# Patient Record
Sex: Male | Born: 1945 | Race: White | Hispanic: No | Marital: Married | Smoking: Former smoker
Health system: Southern US, Community
[De-identification: ages and names within clinical notes are randomized; demographics above are authoritative.]

## PROBLEM LIST (undated history)

## (undated) DIAGNOSIS — M1990 Unspecified osteoarthritis, unspecified site: Secondary | ICD-10-CM

## (undated) DIAGNOSIS — R011 Cardiac murmur, unspecified: Secondary | ICD-10-CM

## (undated) DIAGNOSIS — E785 Hyperlipidemia, unspecified: Secondary | ICD-10-CM

## (undated) DIAGNOSIS — L0390 Cellulitis, unspecified: Secondary | ICD-10-CM

## (undated) DIAGNOSIS — I350 Nonrheumatic aortic (valve) stenosis: Secondary | ICD-10-CM

## (undated) DIAGNOSIS — I219 Acute myocardial infarction, unspecified: Secondary | ICD-10-CM

## (undated) DIAGNOSIS — K219 Gastro-esophageal reflux disease without esophagitis: Secondary | ICD-10-CM

## (undated) DIAGNOSIS — I2510 Atherosclerotic heart disease of native coronary artery without angina pectoris: Secondary | ICD-10-CM

## (undated) HISTORY — DX: Hyperlipidemia, unspecified: E78.5

## (undated) HISTORY — DX: Cardiac murmur, unspecified: R01.1

## (undated) HISTORY — PX: CATARACT EXTRACTION: SUR2

## (undated) HISTORY — DX: Nonrheumatic aortic (valve) stenosis: I35.0

---

## 2008-11-14 ENCOUNTER — Ambulatory Visit (HOSPITAL_COMMUNITY): Admission: RE | Admit: 2008-11-14 | Discharge: 2008-11-14 | Payer: Self-pay | Admitting: Family Medicine

## 2009-02-22 ENCOUNTER — Ambulatory Visit (HOSPITAL_COMMUNITY): Admission: RE | Admit: 2009-02-22 | Discharge: 2009-02-22 | Payer: Self-pay | Admitting: Ophthalmology

## 2009-03-28 ENCOUNTER — Ambulatory Visit (HOSPITAL_COMMUNITY): Admission: RE | Admit: 2009-03-28 | Discharge: 2009-03-28 | Payer: Self-pay | Admitting: Ophthalmology

## 2009-04-11 ENCOUNTER — Ambulatory Visit (HOSPITAL_COMMUNITY): Admission: RE | Admit: 2009-04-11 | Discharge: 2009-04-11 | Payer: Self-pay | Admitting: Ophthalmology

## 2010-12-30 LAB — BASIC METABOLIC PANEL
BUN: 14 mg/dL (ref 6–23)
CO2: 26 mEq/L (ref 19–32)
Chloride: 105 mEq/L (ref 96–112)
Glucose, Bld: 142 mg/dL — ABNORMAL HIGH (ref 70–99)
Potassium: 4.3 mEq/L (ref 3.5–5.1)
Sodium: 140 mEq/L (ref 135–145)

## 2011-02-04 NOTE — Op Note (Signed)
Jonathan Romero, Jonathan Romero                ACCOUNT NO.:  [id number]   MEDICAL RECORD NO.:  [id number]          PATIENT TYPE:  AMB   LOCATION:  DAY                           FACILITY:  [facility]   PHYSICIAN:  Susanne Greenhouse, MD       DATE OF BIRTH:  1945/11/08   DATE OF PROCEDURE:  04/11/2009  DATE OF DISCHARGE:  04/11/2009                               OPERATIVE REPORT   PREOPERATIVE DIAGNOSES:  1. Nuclear cataract, right eye.  2. 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   POSTOPERATIVE DIAGNOSES:  1. Nuclear cataract, right eye.  2. 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   SURGEON:  Susanne Greenhouse, MD   ANESTHESIA:  General anesthesia.   OPERATIVE SUMMARY:  In the preoperative holding area, dilating drops and  viscous lidocaine were placed into the right eye.  The patient was then  brought to the operating room where he was placed under general  anesthesia, and then the eye was prepped and draped.  Beginning with a  75 blade was used to make a paracentesis port at the surgeon's 2 o'clock  position.  The anterior chamber was then filled with a 1% nonpreserved  lidocaine solution with epinephrine.  This was followed by Viscoat.  A  2.4-mm keratome blade was used to make a scleral tunnel superiorly.  A  single iris hook was placed beneath the incision to retract the iris.  A  bent cystotome needle and Utrata forceps were used to create a  continuous tear capsulotomy.  Hydrodissection was performed using  balanced salt solution on a fine cannula.  The lens nucleus was then  removed using phacoemulsification in a quadrant cracking technique.  Residual cortex was removed with irrigation and aspiration.  The  capsular bag and anterior chamber were refilled with Amvisc Plus.  The  post chamber interocular lens placed into the capsular bag using a  Monarch lens injecting system.  The Amvisc Plus was then removed from  the capsular bag and anterior chamber with irrigation and aspiration.  The iris hook  was removed.  Stromal hydration of the main incision and  paracentesis ports were performed with balanced salt solution on a fine  cannula.  The wounds were tested for leak, which was negative.  A single  10-0 suture was placed superiorly.  Again, the wounds were tested for  leak that were negative.  There were no operative complications.  The  patient tolerated the procedure well, was revived from general  anesthesia, returned recovery area in satisfactory condition.  Prosthetic device used was an Alcon AcrySof lens model [id number], power of  40.0, serial number [id number], 4.036.           ______________________________  Susanne Greenhouse, MD    KEH/MEDQ  D:  05/09/2009  T:  05/09/2009  Job:  [id number]

## 2011-02-04 NOTE — Op Note (Signed)
NAMEALTON, Jonathan Romero                ACCOUNT NO.:  [id number]   MEDICAL RECORD NO.:  [id number]          PATIENT TYPE:  AMB   LOCATION:  DAY                           FACILITY:  [facility]   PHYSICIAN:  Susanne Greenhouse, MD       DATE OF BIRTH:  1945-12-29   DATE OF PROCEDURE:  03/28/2009  DATE OF DISCHARGE:  03/28/2009                               OPERATIVE REPORT   PREOPERATIVE DIAGNOSIS:  1. Nuclear cataract, left eye; diagnosis code 366.16.  2. 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   POSTOPERATIVE DIAGNOSIS:  1. Nuclear cataract, left eye; diagnosis code 366.16  2. 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   OPERATION PERFORMED:  Phacoemulsification with posterior chamber  intraocular lens implantation, left eye.   SURGEON:  Susanne Greenhouse, MD   ANESTHESIA:  General endotracheal anesthesia.   OPERATIVE SUMMARY:  In the preoperative area, dilating drops were placed  into the left eye.  The patient was then brought into the operating room  where he was placed under general anesthesia.  The eye was then prepped  and draped.  Beginning with a 75 blade, a paracentesis port was made at  the surgeon's 2 o'clock position.  The anterior chamber was then filled  with a 1% nonpreserved lidocaine solution with epinephrine.  This was  followed by Viscoat to deepen the chamber.  A small fornix-based  peritomy was performed superiorly.  Next, a single iris hook was placed  through the limbus superiorly.  A 2.4-mm keratome blade was then used to  make a clear corneal incision over the iris hook.  A bent cystotome  needle and Utrata forceps were used to create a continuous tear  capsulotomy.  Hydrodissection was performed using balanced salt solution  on a fine cannula.  The lens nucleus was then removed using  phacoemulsification in a quadrant cracking technique.  The cortical  material was then removed with irrigation and aspiration.  The capsular  bag and anterior chamber were refilled with Provisc.   The wound was  widened to approximately 3 mm and a posterior chamber intraocular lens  was placed into the capsular bag without difficulty using an Mirant lens injecting system.  A single 10-0 nylon suture was then used  to close the incision as well as stromal hydration.  The Provisc was  removed from the anterior chamber and capsular bag with irrigation and  aspiration.  At this point, the wounds were tested for leak, which were  negative.  The anterior chamber remained  deep and stable.  The patient tolerated the procedure well.  There were  no operative complications, and he awoke from general anesthesia without  problem.  Prosthetic device used is an Alcon AcrySof posterior chamber  lens model [id number], power of 40.0, serial number is [id number].           ______________________________  Susanne Greenhouse, MD     KEH/MEDQ  D:  03/29/2009  T:  03/29/2009  Job:  [id number]

## 2011-09-23 DIAGNOSIS — E785 Hyperlipidemia, unspecified: Secondary | ICD-10-CM

## 2011-09-23 HISTORY — DX: Hyperlipidemia, unspecified: E78.5

## 2011-09-29 ENCOUNTER — Emergency Department (HOSPITAL_COMMUNITY)
Admission: EM | Admit: 2011-09-29 | Discharge: 2011-09-29 | Disposition: A | Discharge status: Home / Self Care | Payer: Medicare Other | Attending: Emergency Medicine | Admitting: Emergency Medicine

## 2011-09-29 ENCOUNTER — Encounter: Payer: Self-pay | Admitting: Emergency Medicine

## 2011-09-29 DIAGNOSIS — L02419 Cutaneous abscess of limb, unspecified: Secondary | ICD-10-CM | POA: Diagnosis not present

## 2011-09-29 DIAGNOSIS — R011 Cardiac murmur, unspecified: Secondary | ICD-10-CM | POA: Diagnosis not present

## 2011-09-29 DIAGNOSIS — L03115 Cellulitis of right lower limb: Secondary | ICD-10-CM

## 2011-09-29 DIAGNOSIS — L03119 Cellulitis of unspecified part of limb: Secondary | ICD-10-CM | POA: Diagnosis not present

## 2011-09-29 LAB — DIFFERENTIAL
Eosinophils Absolute: 0.5 10*3/uL (ref 0.0–0.7)
Eosinophils Relative: 5 % (ref 0–5)
Lymphocytes Relative: 32 % (ref 12–46)
Lymphs Abs: 2.6 10*3/uL (ref 0.7–4.0)
Monocytes Relative: 8 % (ref 3–12)

## 2011-09-29 LAB — CBC
Hemoglobin: 14.8 g/dL (ref 13.0–17.0)
MCH: 33.2 pg (ref 26.0–34.0)
MCV: 95.7 fL (ref 78.0–100.0)
Platelets: 190 10*3/uL (ref 150–400)
RBC: 4.46 MIL/uL (ref 4.22–5.81)
WBC: 8.4 10*3/uL (ref 4.0–10.5)

## 2011-09-29 LAB — BASIC METABOLIC PANEL
BUN: 17 mg/dL (ref 6–23)
CO2: 27 mEq/L (ref 19–32)
GFR calc non Af Amer: 87 mL/min — ABNORMAL LOW (ref >90)
Glucose, Bld: 118 mg/dL — ABNORMAL HIGH (ref 70–99)
Potassium: 4 mEq/L (ref 3.5–5.1)
Sodium: 137 mEq/L (ref 135–145)

## 2011-09-29 MED ORDER — DOXYCYCLINE HYCLATE 100 MG PO CAPS: 100.0000 mg | ORAL_CAPSULE | Freq: Two times a day (BID) | ORAL | Status: AC

## 2011-09-29 MED ORDER — DOXYCYCLINE HYCLATE 100 MG PO TABS
100.0000 mg | ORAL_TABLET | Freq: Once | ORAL | Status: AC
  Administered 2011-09-29: 100 mg via ORAL
  Filled 2011-09-29: qty 1

## 2011-09-29 MED ORDER — VANCOMYCIN HCL IN DEXTROSE 1-5 GM/200ML-% IV SOLN
1000.0000 mg | Freq: Once | INTRAVENOUS | Status: AC
  Administered 2011-09-29: 1000 mg via INTRAVENOUS
  Filled 2011-09-29: qty 200

## 2011-09-29 NOTE — ED Provider Notes (Signed)
History     CSN: [id number]  Arrival date & time 09/29/11  1012   First MD Initiated Contact with Patient 09/29/11 1057      Chief Complaint  Patient presents with  . Recurrent Skin Infections     Patient is a 66 y.o. male presenting with rash. The history is provided by the patient.  Rash  This is a new problem. The current episode started more than 2 days ago. The problem has been gradually worsening. Associated with: possible insect bite. There has been no fever. Affected Location: right calf. The pain is mild. The pain has been constant since onset. Associated symptoms include itching and pain. Treatments tried: topical peroxide.  reports possible insect bite while working outside [employer] a "sting" Since has had pain/redness to right LE No h/o DVT No travel/surgery Reports redness worsened then somewhat improved over the past 24 hurs No h/o Diabetes No h/o severe infection previously  PMH - none  History reviewed. No pertinent past surgical history.  History reviewed. No pertinent family history.  History  Substance Use Topics  . Smoking status: Never Smoker   . Smokeless tobacco: Not on file  . Alcohol Use: No      Review of Systems  Skin: Positive for itching and rash.  All other systems reviewed and are negative.    Allergies  Review of patient's allergies indicates no known allergies.  Home Medications  No current outpatient prescriptions on file.  BP 152/77  Temp(Src) 98.4 F (36.9 C) (Oral)  Resp 20  Ht 5\' 11"  (1.803 m)  Wt 220 lb (99.791 kg)  BMI 30.68 kg/m2  SpO2 97%  Physical Exam CONSTITUTIONAL: Well developed/well nourished HEAD AND FACE: Normocephalic/atraumatic EYES: EOMI/PERRL ENMT: Mucous membranes moist NECK: supple no meningeal signs SPINE:entire spine nontender CV: murmur noted LUNGS: Lungs are clear to auscultation bilaterally, no apparent distress ABDOMEN: soft, nontender, no rebound or guarding GU:no cva tenderness NEURO:  Pt is awake/alert, moves all extremitiesx4 EXTREMITIES: pulses normal, full ROM Erythema noted to right calf but does not encroach into knee/ankle joint.  No calf tenderness.  No edema.  No crepitance.  No drainage.  No abscess noted SKIN: warm PSYCH: no abnormalities of mood noted  ED Course  Procedures   Labs Reviewed  BASIC METABOLIC PANEL - Abnormal; Notable for the following:    Glucose, Bld 118 (*)    GFR calc non Af Amer 87 (*)    All other components within normal limits  CBC  DIFFERENTIAL   1:09 PM Exam more c/w cellulitis rather than DVT Pt is well appearing, otherwise healthy Would be candidate for outpatient management and recheck in 48 hours Pt agreeable  MDM  Nursing notes reviewed and considered in documentation All labs/vitals reviewed and considered         Joya Gaskins, MD 09/29/11 1338

## 2011-09-29 NOTE — ED Notes (Signed)
Redness and swelling to right lower leg. Pt states might have been bitten by spider while working out in yard last week.

## 2011-09-29 NOTE — ED Notes (Signed)
Patient with no complaints at this time. Respirations even and unlabored. Skin warm/dry. Discharge instructions reviewed with patient at this time. Patient given opportunity to voice concerns/ask questions. IV removed per policy and band-aid applied to site. Patient discharged at this time and left Emergency Department with steady gait.  

## 2011-10-03 ENCOUNTER — Other Ambulatory Visit (HOSPITAL_COMMUNITY): Payer: Self-pay | Admitting: Internal Medicine

## 2011-10-03 ENCOUNTER — Ambulatory Visit (HOSPITAL_COMMUNITY)
Admission: RE | Admit: 2011-10-03 | Discharge: 2011-10-03 | Disposition: A | Discharge status: Home / Self Care | Payer: Medicare Other | Source: Ambulatory Visit | Attending: Internal Medicine | Admitting: Internal Medicine

## 2011-10-03 DIAGNOSIS — L0291 Cutaneous abscess, unspecified: Secondary | ICD-10-CM

## 2011-10-03 DIAGNOSIS — M79609 Pain in unspecified limb: Secondary | ICD-10-CM | POA: Insufficient documentation

## 2011-10-03 DIAGNOSIS — K219 Gastro-esophageal reflux disease without esophagitis: Secondary | ICD-10-CM | POA: Diagnosis not present

## 2011-10-03 DIAGNOSIS — I359 Nonrheumatic aortic valve disorder, unspecified: Secondary | ICD-10-CM | POA: Diagnosis not present

## 2011-10-03 DIAGNOSIS — Z23 Encounter for immunization: Secondary | ICD-10-CM | POA: Diagnosis not present

## 2011-10-03 DIAGNOSIS — L0390 Cellulitis, unspecified: Secondary | ICD-10-CM | POA: Insufficient documentation

## 2011-10-03 DIAGNOSIS — Z6831 Body mass index (BMI) 31.0-31.9, adult: Secondary | ICD-10-CM | POA: Diagnosis not present

## 2011-10-06 DIAGNOSIS — Z125 Encounter for screening for malignant neoplasm of prostate: Secondary | ICD-10-CM | POA: Diagnosis not present

## 2011-10-06 DIAGNOSIS — Z79899 Other long term (current) drug therapy: Secondary | ICD-10-CM | POA: Diagnosis not present

## 2011-10-06 DIAGNOSIS — L02818 Cutaneous abscess of other sites: Secondary | ICD-10-CM | POA: Diagnosis not present

## 2011-10-22 DIAGNOSIS — L0390 Cellulitis, unspecified: Secondary | ICD-10-CM | POA: Diagnosis not present

## 2011-10-22 DIAGNOSIS — E785 Hyperlipidemia, unspecified: Secondary | ICD-10-CM | POA: Diagnosis not present

## 2011-10-22 DIAGNOSIS — Z6830 Body mass index (BMI) 30.0-30.9, adult: Secondary | ICD-10-CM | POA: Diagnosis not present

## 2011-10-22 DIAGNOSIS — L0291 Cutaneous abscess, unspecified: Secondary | ICD-10-CM | POA: Diagnosis not present

## 2011-11-15 ENCOUNTER — Encounter (HOSPITAL_COMMUNITY): Payer: Self-pay | Admitting: Adult Health

## 2011-11-15 ENCOUNTER — Emergency Department (HOSPITAL_COMMUNITY)
Admission: EM | Admit: 2011-11-15 | Discharge: 2011-11-15 | Disposition: A | Discharge status: Home / Self Care | Payer: Medicare Other | Attending: Emergency Medicine | Admitting: Emergency Medicine

## 2011-11-15 DIAGNOSIS — R21 Rash and other nonspecific skin eruption: Secondary | ICD-10-CM | POA: Diagnosis not present

## 2011-11-15 DIAGNOSIS — L0889 Other specified local infections of the skin and subcutaneous tissue: Secondary | ICD-10-CM | POA: Insufficient documentation

## 2011-11-15 DIAGNOSIS — L298 Other pruritus: Secondary | ICD-10-CM | POA: Insufficient documentation

## 2011-11-15 DIAGNOSIS — L2989 Other pruritus: Secondary | ICD-10-CM | POA: Insufficient documentation

## 2011-11-15 HISTORY — DX: Cellulitis, unspecified: L03.90

## 2011-11-15 NOTE — ED Notes (Signed)
Pt given discharge instruction and verb understanding, amb with steady gait to discharge window.  

## 2011-11-15 NOTE — ED Provider Notes (Signed)
History     CSN: [id number]  Arrival date & time 11/15/11  1529   First MD Initiated Contact with Patient 11/15/11 1546      Chief Complaint  Patient presents with  . Recurrent Skin Infections    (Consider location/radiation/quality/duration/timing/severity/associated sxs/prior treatment) The history is provided by the patient.   patient has had what was diagnosed as cellulitis on his right lower leg. He's had that since the end of January. His been on doxycycline and Bactrim for it. Ms. seen in the ER and in by his primary care doctor twice. The last couple days she's developed a different rash to his back stomach flank side. His head previous fevers, but not in the last few days. There's some itchiness to his low back, but the rest of the rash does not itch. He states he feels as if his leg rest doing better. He states they did a Doppler and did not find a blood clot. Has not had any rashes mouth.  Past Medical History  Diagnosis Date  . Cellulitis     History reviewed. No pertinent past surgical history.  History reviewed. No pertinent family history.  History  Substance Use Topics  . Smoking status: Never Smoker   . Smokeless tobacco: Not on file  . Alcohol Use: No      Review of Systems  Constitutional: Negative for fever.  Respiratory: Negative for chest tightness.   Cardiovascular: Negative for chest pain.  Gastrointestinal: Negative for blood in stool.  Musculoskeletal: Negative for back pain.  Skin: Positive for color change and rash.  Neurological: Negative for syncope and numbness.    Allergies  Review of patient's allergies indicates no known allergies.  Home Medications   Current Outpatient Rx  Name Route Sig Dispense Refill  . ASPIRIN EC 81 MG PO TBEC Oral Take 81 mg by mouth daily.    . OMEGA-3 FATTY ACIDS 1000 MG PO CAPS Oral Take 1 g by mouth 2 (two) times daily.    . SULFAMETHOXAZOLE-TMP DS 800-160 MG PO TABS Oral Take 1 tablet by mouth 2 (two)  times daily. Course started 11/03/11 for 14 days    . DOXYCYCLINE HYCLATE 100 MG PO TABS Oral Take 100 mg by mouth 2 (two) times daily. Course started 10/09/11 for 7 days      BP 161/90  Pulse 102  Temp(Src) 97.7 F (36.5 C) (Oral)  Ht 5\' 11"  (1.803 m)  Wt 212 lb (96.163 kg)  BMI 29.57 kg/m2  SpO2 95%  Physical Exam  Nursing note and vitals reviewed. Constitutional: He is oriented to person, place, and time. He appears well-developed and well-nourished.  HENT:  Head: Normocephalic and atraumatic.  Eyes: EOM are normal. Pupils are equal, round, and reactive to light.  Neck: Normal range of motion. Neck supple.  Cardiovascular: Normal rate, regular rhythm and normal heart sounds.   No murmur heard. Pulmonary/Chest: Effort normal and breath sounds normal.  Abdominal: Soft. Bowel sounds are normal. He exhibits no distension and no mass. There is no tenderness. There is no rebound and no guarding.  Musculoskeletal: He exhibits no edema.  Neurological: He is alert and oriented to person, place, and time. No cranial nerve deficit.  Skin: Skin is warm and dry.        right lower extremity erythema. Healing excoriations. No duration. Good distal pulse. Good capillary refill. Patient has patches of erythema on chest and abdomen and back. It is worse on the bilateral flanks and lower back. Rash  is not raised. No scaling. The rash blanches. No mucous membrane involvement   Psychiatric: He has a normal mood and affect.    ED Course  Procedures (including critical care time)  Labs Reviewed - No data to display No results found.   1. Rash       MDM  Patient has had cellulitis of his right lower leg. Appears to be healing well. He has since developed a rash on his torso. It is possibly related to the Bactrim. He only has a couple days this medicine left and I will stop it. He was instructed that he can use hydrocortisone cream Benadryl cream or orally to help with any itching. He is to  follow with his doctor in 2 days for reexamination. There is no mucous membrane involvement.  Juliet Rude. Rubin Payor, MD 11/15/11 1610

## 2011-11-15 NOTE — ED Notes (Signed)
Seen at [facility] for right lower leg cellulitis and placed on 3 different antibitics that he has been taking for 24 days. Now pt has a rash to back, stomach, bilateral flanks and sides that began today, c/o mild itchiness and fever.

## 2011-11-15 NOTE — Discharge Instructions (Signed)
Drug Rash Skin reactions can be caused by several different drugs. Allergy to the medicine can cause itching, hives, and other rashes. Sun exposure causes a red rash with some medicines. Mononucleosis virus can cause a similar red rash when you are taking antibiotics. Sometimes, the rash may be accompanied by pain. The drug rash may happen with new drugs or with medicines that you have been taking for a while. The rash cannot be spread from person to person. In most cases, the symptoms of a drug rash are gone within a few days of stopping the medicine. Your rash, including hives (urticaria), is most likely from the following medicines:  Antibiotics or antimicrobials.   Anticonvulsants or seizure medicines.   Antihypertensives or blood pressure medicines.   Antimalarials.   Antidepressants or depression medicines.   Antianxiety drugs.   Diuretics or water pills.   Nonsteroidal anti-inflammatory drugs.   Simvastatin.   Lithium.   Omeprazole.   Allopurinol.   Pseudoephedrine.   Amiodarone.   Packed red blood cells, when you get a blood transfusion.   Contrast media, such as when getting an imaging test (CT or CAT scan).  This drug list is not all inclusive, but drug rashes have been reported with all the medicines listed above.Your caregiver will tell you which medicines to avoid. If you react to a medicine, a similar or worse reaction can occur the next time you take it. If you need to stop taking an antibiotic because of a drug rash, an alternative antibiotic may be needed to get rid of your infection. Antihistamine or cortisone drugs may be prescribed to help relieve your symptoms. Stay out of the sun until the rash is completely gone.  Be sure to let your caregiver know about your drug reaction. Do not take this medicine in the future. Call your caregiver if your drug rash does not improve within 3 to 4 days. SEEK IMMEDIATE MEDICAL CARE IF:   You develop breathing problems,  swelling in the throat, or wheezing.   You have weakness, fainting, fever, and muscle or joint pains.   You develop blisters or peeling of skin, especially around the mouth.  Document Released: 10/16/2004 Document Revised: 05/21/2011 Document Reviewed: 07/27/2008 [facility] Patient Information 2012 [place], [place].Rash, Generic Many things can cause a rash. We are not certain what is causing the rash that you have. Some causes include infection, allergic reactions, medications, and chemicals. Sometimes something in your home that comes in contact with your skin may cause the rash. These include pets, new soaps, cosmetics, and foods. HOME CARE INSTRUCTIONS   Avoid extreme heat or cold, unless otherwise instructed. This can make the itching worse.   A cool bath or shower or a cool washcloth can sometimes ease the itching.   Avoid scratching. This can cause infection.   Take those medications prescribed by your caregiver.  SEEK IMMEDIATE MEDICAL CARE IF:  You develop increasing pain, swelling, or redness.   You develop a fever.   You develop new or severe symptoms such as body aches and pains, diarrhea, vomiting.   Your rash is not better in 3 days.  Document Released: 08/29/2002 Document Revised: 05/21/2011 Document Reviewed: 11/03/2008 [facility] Patient Information 2012 [place], [place].

## 2011-11-29 DIAGNOSIS — Z6830 Body mass index (BMI) 30.0-30.9, adult: Secondary | ICD-10-CM | POA: Diagnosis not present

## 2011-11-29 DIAGNOSIS — L0291 Cutaneous abscess, unspecified: Secondary | ICD-10-CM | POA: Diagnosis not present

## 2011-11-29 DIAGNOSIS — L0390 Cellulitis, unspecified: Secondary | ICD-10-CM | POA: Diagnosis not present

## 2011-11-29 DIAGNOSIS — K219 Gastro-esophageal reflux disease without esophagitis: Secondary | ICD-10-CM | POA: Diagnosis not present

## 2011-12-08 DIAGNOSIS — I872 Venous insufficiency (chronic) (peripheral): Secondary | ICD-10-CM | POA: Diagnosis not present

## 2011-12-08 DIAGNOSIS — L42 Pityriasis rosea: Secondary | ICD-10-CM | POA: Diagnosis not present

## 2011-12-09 DIAGNOSIS — R011 Cardiac murmur, unspecified: Secondary | ICD-10-CM | POA: Diagnosis not present

## 2011-12-09 DIAGNOSIS — L0291 Cutaneous abscess, unspecified: Secondary | ICD-10-CM | POA: Diagnosis not present

## 2011-12-16 DIAGNOSIS — I059 Rheumatic mitral valve disease, unspecified: Secondary | ICD-10-CM | POA: Diagnosis not present

## 2011-12-16 DIAGNOSIS — I359 Nonrheumatic aortic valve disorder, unspecified: Secondary | ICD-10-CM | POA: Diagnosis not present

## 2011-12-16 HISTORY — PX: DOPPLER ECHOCARDIOGRAPHY: SHX263

## 2011-12-31 DIAGNOSIS — I359 Nonrheumatic aortic valve disorder, unspecified: Secondary | ICD-10-CM | POA: Diagnosis not present

## 2011-12-31 DIAGNOSIS — E782 Mixed hyperlipidemia: Secondary | ICD-10-CM | POA: Diagnosis not present

## 2012-01-02 DIAGNOSIS — E782 Mixed hyperlipidemia: Secondary | ICD-10-CM | POA: Diagnosis not present

## 2012-03-02 ENCOUNTER — Emergency Department (HOSPITAL_COMMUNITY)
Admission: EM | Admit: 2012-03-02 | Discharge: 2012-03-02 | Disposition: A | Discharge status: Home / Self Care | Payer: Medicare Other

## 2012-03-02 NOTE — ED Notes (Signed)
No answer when called 

## 2012-09-06 ENCOUNTER — Other Ambulatory Visit (HOSPITAL_COMMUNITY): Payer: Self-pay | Admitting: Internal Medicine

## 2012-09-06 DIAGNOSIS — I359 Nonrheumatic aortic valve disorder, unspecified: Secondary | ICD-10-CM

## 2012-10-28 ENCOUNTER — Ambulatory Visit (HOSPITAL_COMMUNITY)
Admission: RE | Admit: 2012-10-28 | Discharge: 2012-10-28 | Disposition: A | Discharge status: Home / Self Care | Payer: Medicare Other | Source: Ambulatory Visit | Attending: Family Medicine | Admitting: Family Medicine

## 2012-10-28 ENCOUNTER — Other Ambulatory Visit (HOSPITAL_COMMUNITY): Payer: Self-pay | Admitting: Family Medicine

## 2012-10-28 DIAGNOSIS — J209 Acute bronchitis, unspecified: Secondary | ICD-10-CM | POA: Insufficient documentation

## 2012-10-28 DIAGNOSIS — R071 Chest pain on breathing: Secondary | ICD-10-CM | POA: Insufficient documentation

## 2012-11-22 ENCOUNTER — Ambulatory Visit (HOSPITAL_COMMUNITY): Payer: Medicare Other

## 2012-12-13 ENCOUNTER — Other Ambulatory Visit (HOSPITAL_COMMUNITY): Payer: Self-pay | Admitting: Internal Medicine

## 2012-12-13 DIAGNOSIS — I350 Nonrheumatic aortic (valve) stenosis: Secondary | ICD-10-CM

## 2012-12-21 ENCOUNTER — Ambulatory Visit (HOSPITAL_COMMUNITY)
Admission: RE | Admit: 2012-12-21 | Discharge: 2012-12-21 | Disposition: A | Discharge status: Home / Self Care | Payer: Medicare Other | Source: Ambulatory Visit | Attending: Internal Medicine | Admitting: Internal Medicine

## 2012-12-21 DIAGNOSIS — I350 Nonrheumatic aortic (valve) stenosis: Secondary | ICD-10-CM

## 2012-12-21 DIAGNOSIS — E785 Hyperlipidemia, unspecified: Secondary | ICD-10-CM | POA: Insufficient documentation

## 2012-12-21 DIAGNOSIS — I079 Rheumatic tricuspid valve disease, unspecified: Secondary | ICD-10-CM | POA: Insufficient documentation

## 2012-12-21 DIAGNOSIS — I080 Rheumatic disorders of both mitral and aortic valves: Secondary | ICD-10-CM | POA: Insufficient documentation

## 2012-12-21 HISTORY — PX: DOPPLER ECHOCARDIOGRAPHY: SHX263

## 2012-12-21 NOTE — Progress Notes (Signed)
2D Echo Performed 12/21/2012    Jayshon Dommer, RCS  

## 2013-03-21 ENCOUNTER — Other Ambulatory Visit: Payer: Self-pay | Admitting: Internal Medicine

## 2013-03-21 LAB — COMPREHENSIVE METABOLIC PANEL
Albumin: 4 g/dL (ref 3.5–5.2)
BUN: 18 mg/dL (ref 6–23)
Calcium: 9 mg/dL (ref 8.4–10.5)
Chloride: 106 mEq/L (ref 96–112)
Creat: 0.82 mg/dL (ref 0.50–1.35)
Glucose, Bld: 99 mg/dL (ref 70–99)
Potassium: 4 mEq/L (ref 3.5–5.3)

## 2013-03-23 LAB — NMR LIPOPROFILE WITH LIPIDS
HDL Size: 8.6 nm — ABNORMAL LOW (ref >=9.2)
HDL-C: 31 mg/dL — ABNORMAL LOW (ref >=40)
LDL (calc): 131 mg/dL — ABNORMAL HIGH (ref <100)
LDL Particle Number: 2503 nmol/L — ABNORMAL HIGH (ref <1000)
LDL Size: 19.7 nm — ABNORMAL LOW (ref >20.5)
LP-IR Score: 66 — ABNORMAL HIGH (ref <=45)
Triglycerides: 192 mg/dL — ABNORMAL HIGH (ref <150)
VLDL Size: 45.6 nm (ref <=46.6)

## 2013-03-30 ENCOUNTER — Telehealth: Payer: Self-pay | Admitting: Internal Medicine

## 2013-04-14 ENCOUNTER — Ambulatory Visit (INDEPENDENT_AMBULATORY_CARE_PROVIDER_SITE_OTHER): Payer: Medicare Other | Admitting: Internal Medicine

## 2013-04-14 ENCOUNTER — Encounter: Payer: Self-pay | Admitting: Internal Medicine

## 2013-04-14 VITALS — BP 146/76 | HR 70 | Ht 71.0 in | Wt 204.0 lb

## 2013-04-14 DIAGNOSIS — R011 Cardiac murmur, unspecified: Secondary | ICD-10-CM

## 2013-04-14 DIAGNOSIS — I350 Nonrheumatic aortic (valve) stenosis: Secondary | ICD-10-CM | POA: Insufficient documentation

## 2013-04-14 DIAGNOSIS — I359 Nonrheumatic aortic valve disorder, unspecified: Secondary | ICD-10-CM

## 2013-04-14 DIAGNOSIS — E785 Hyperlipidemia, unspecified: Secondary | ICD-10-CM | POA: Insufficient documentation

## 2013-04-14 MED ORDER — COLESEVELAM HCL 3.75 G PO PACK: 3.7500 g | PACK | Freq: Every day | ORAL | Status: DC

## 2013-04-14 NOTE — Progress Notes (Signed)
  OFFICE NOTE  Chief Complaint:  Followup lipid profile  Primary Care Physician: Colette Ribas, MD  HPI:  Jonathan Romero is a 67 year old gentleman with a history of systolic murmur. He had an echocardiogram last year, which showed a very mild aortic stenosis, peak and mean gradients of 18 and 8 mmHg and a valve area of around 2 square centimeters. He also has marked dyslipidemia. 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 Former testing a year ago of his cholesterol profile showed total LDL particle number of 3135 with LDL calculated at 151 and 1610 in 1988 was the small LDL particle number, which is very high. I recommended he start on Lipitor 40 mg daily; however, he did not get that filled. Eventually he did start taking the Lipitor however reported significant myalgias which is intolerable and has since stopped it. He will not take any statin medications in the future. We did have a repeat lipid profile which shows a particle numbers that is persistently elevated at 2500, but is improved thanks to some weight loss and dietary changes he's made.   PMHx:  Past Medical History  Diagnosis Date  . Cellulitis   . Systolic murmur   . Dyslipidemia     Past Surgical History  Procedure Laterality Date  . Cataract extraction      FAMHx:  No family history on file.  SOCHx:   reports that he quit smoking about 30 years ago. He has never used smokeless tobacco. He reports that  drinks alcohol. He reports that he does not use illicit drugs.  ALLERGIES:  No Known Allergies  ROS: A comprehensive review of systems was negative.  HOME MEDS: Current Outpatient Prescriptions  Medication Sig Dispense Refill  . Multiple Vitamins-Minerals (MENS MULTIVITAMIN PLUS PO) Take by mouth.      . Colesevelam HCl (WELCHOL) 3.75 G PACK Take 1 packet (3.8 g total) by mouth daily.  18 each  0   No current facility-administered medications for this visit.     LABS/IMAGING: No results found for this or any previous visit (from the past 48 hour(s)). No results found.  VITALS: BP 146/76  Pulse 70  Ht 5\' 11"  (1.803 m)  Wt 204 lb (92.534 kg)  BMI 28.46 kg/m2  EXAM: deferred  EKG: deferred  ASSESSMENT: 1. Mild aortic stenosis 2. Uncontrolled dyslipidemia  PLAN: 1.   Jonathan Romero has continued high cholesterol with risk factors for progressive aortic stenosis. He is not interested in taking any statin medications. We'll go ahead and try him on Welchol 3.75 g daily.  Plan to see him back with a repeat lipid profile in 6 months.   Chrystie Nose, MD, [facility] Attending Cardiologist [facility]  Jonathan Romero C 04/14/2013, 5:17 PM

## 2013-04-14 NOTE — Patient Instructions (Addendum)
Your physician recommends that you return for lab work in: 1 month. You will need to be fasting for this blood work. NMR with lipid  Your physician has recommended you make the following change in your medication: Start taking Welchol 3.75 grams daily with meals.  Your physician recommends that you schedule a follow-up appointment in: 6 months

## 2013-04-27 ENCOUNTER — Other Ambulatory Visit: Payer: Self-pay

## 2013-05-09 ENCOUNTER — Telehealth: Payer: Self-pay | Admitting: Internal Medicine

## 2013-05-09 NOTE — Telephone Encounter (Signed)
Welchol was given to him at last visit w/Dr [facility]  He quit taking 4 -5 days ago due to reactions couldn't swallow felt like throat and neck swollen right arm elbow and shoulder  Same reactions he had to statins  Please call

## 2013-05-09 NOTE — Telephone Encounter (Signed)
Returned call.  Pt stated he wanted to let Dr. Rennis Golden know that he had to quit taking Welchol.  Stated he had a "crunch" when he turned his head L to R.  C/o sore throat and swollen neck.  Stated he can't take it, like the -statins.  Stated it makes he feel out of sorts.  Also c/o burning in stomach and chest and constipation.  Pt informed these are common SEs of the medication and Dr. Rennis Golden will be notified for further instructions.  Pt verbalized understanding and agreed w/ plan.  Message forwarded to Dr. Rennis Golden.

## 2013-07-28 ENCOUNTER — Other Ambulatory Visit: Payer: Self-pay

## 2014-01-11 NOTE — Telephone Encounter (Signed)
Encounter has been closed--TP 01/11/14 

## 2014-03-01 DIAGNOSIS — H6980 Other specified disorders of Eustachian tube, unspecified ear: Secondary | ICD-10-CM | POA: Diagnosis not present

## 2014-03-01 DIAGNOSIS — J029 Acute pharyngitis, unspecified: Secondary | ICD-10-CM | POA: Diagnosis not present

## 2014-03-01 DIAGNOSIS — Z6829 Body mass index (BMI) 29.0-29.9, adult: Secondary | ICD-10-CM | POA: Diagnosis not present

## 2015-03-27 ENCOUNTER — Encounter: Payer: Self-pay | Admitting: *Deleted

## 2015-04-10 ENCOUNTER — Encounter: Payer: Self-pay | Admitting: Internal Medicine

## 2015-04-24 ENCOUNTER — Encounter: Payer: Self-pay | Admitting: Internal Medicine

## 2016-01-07 DIAGNOSIS — E663 Overweight: Secondary | ICD-10-CM | POA: Diagnosis not present

## 2016-01-07 DIAGNOSIS — Z1389 Encounter for screening for other disorder: Secondary | ICD-10-CM | POA: Diagnosis not present

## 2016-01-07 DIAGNOSIS — T07 Unspecified multiple injuries: Secondary | ICD-10-CM | POA: Diagnosis not present

## 2016-01-07 DIAGNOSIS — Z6829 Body mass index (BMI) 29.0-29.9, adult: Secondary | ICD-10-CM | POA: Diagnosis not present

## 2016-01-07 DIAGNOSIS — W57XXXA Bitten or stung by nonvenomous insect and other nonvenomous arthropods, initial encounter: Secondary | ICD-10-CM | POA: Diagnosis not present

## 2017-06-30 DIAGNOSIS — H26493 Other secondary cataract, bilateral: Secondary | ICD-10-CM | POA: Diagnosis not present

## 2017-06-30 DIAGNOSIS — Z961 Presence of intraocular lens: Secondary | ICD-10-CM | POA: Diagnosis not present

## 2017-06-30 DIAGNOSIS — I10 Essential (primary) hypertension: Secondary | ICD-10-CM | POA: Diagnosis not present

## 2017-06-30 DIAGNOSIS — H35033 Hypertensive retinopathy, bilateral: Secondary | ICD-10-CM | POA: Diagnosis not present

## 2017-07-09 DIAGNOSIS — Z6828 Body mass index (BMI) 28.0-28.9, adult: Secondary | ICD-10-CM | POA: Diagnosis not present

## 2017-07-09 DIAGNOSIS — Z23 Encounter for immunization: Secondary | ICD-10-CM | POA: Diagnosis not present

## 2017-07-09 DIAGNOSIS — Z1389 Encounter for screening for other disorder: Secondary | ICD-10-CM | POA: Diagnosis not present

## 2017-07-09 DIAGNOSIS — E663 Overweight: Secondary | ICD-10-CM | POA: Diagnosis not present

## 2017-07-09 DIAGNOSIS — L0390 Cellulitis, unspecified: Secondary | ICD-10-CM | POA: Diagnosis not present

## 2017-07-13 DIAGNOSIS — Z1211 Encounter for screening for malignant neoplasm of colon: Secondary | ICD-10-CM | POA: Diagnosis not present

## 2017-09-07 DIAGNOSIS — H40033 Anatomical narrow angle, bilateral: Secondary | ICD-10-CM | POA: Diagnosis not present

## 2017-09-07 DIAGNOSIS — H26493 Other secondary cataract, bilateral: Secondary | ICD-10-CM | POA: Diagnosis not present

## 2018-05-03 DIAGNOSIS — H524 Presbyopia: Secondary | ICD-10-CM | POA: Diagnosis not present

## 2018-05-03 DIAGNOSIS — H35033 Hypertensive retinopathy, bilateral: Secondary | ICD-10-CM | POA: Diagnosis not present

## 2018-05-03 DIAGNOSIS — I10 Essential (primary) hypertension: Secondary | ICD-10-CM | POA: Diagnosis not present

## 2018-05-03 DIAGNOSIS — Z961 Presence of intraocular lens: Secondary | ICD-10-CM | POA: Diagnosis not present

## 2018-11-25 ENCOUNTER — Encounter (HOSPITAL_COMMUNITY): Payer: Self-pay | Admitting: Emergency Medicine

## 2018-11-25 ENCOUNTER — Inpatient Hospital Stay (HOSPITAL_COMMUNITY)
Admission: EM | Admit: 2018-11-25 | Discharge: 2018-11-25 | DRG: 069 | Disposition: A | Discharge status: Home / Self Care | Payer: Medicare Other | Attending: Internal Medicine | Admitting: Internal Medicine

## 2018-11-25 ENCOUNTER — Other Ambulatory Visit: Payer: Self-pay

## 2018-11-25 ENCOUNTER — Emergency Department (HOSPITAL_COMMUNITY): Payer: Medicare Other

## 2018-11-25 ENCOUNTER — Inpatient Hospital Stay (HOSPITAL_COMMUNITY): Payer: Medicare Other

## 2018-11-25 DIAGNOSIS — I160 Hypertensive urgency: Secondary | ICD-10-CM | POA: Diagnosis present

## 2018-11-25 DIAGNOSIS — Z833 Family history of diabetes mellitus: Secondary | ICD-10-CM

## 2018-11-25 DIAGNOSIS — Z87891 Personal history of nicotine dependence: Secondary | ICD-10-CM | POA: Diagnosis not present

## 2018-11-25 DIAGNOSIS — R0789 Other chest pain: Secondary | ICD-10-CM | POA: Diagnosis not present

## 2018-11-25 DIAGNOSIS — Z8249 Family history of ischemic heart disease and other diseases of the circulatory system: Secondary | ICD-10-CM | POA: Diagnosis not present

## 2018-11-25 DIAGNOSIS — G459 Transient cerebral ischemic attack, unspecified: Secondary | ICD-10-CM | POA: Diagnosis not present

## 2018-11-25 DIAGNOSIS — R200 Anesthesia of skin: Secondary | ICD-10-CM | POA: Diagnosis present

## 2018-11-25 DIAGNOSIS — Z79899 Other long term (current) drug therapy: Secondary | ICD-10-CM

## 2018-11-25 DIAGNOSIS — R0602 Shortness of breath: Secondary | ICD-10-CM | POA: Diagnosis not present

## 2018-11-25 DIAGNOSIS — E785 Hyperlipidemia, unspecified: Secondary | ICD-10-CM | POA: Diagnosis present

## 2018-11-25 DIAGNOSIS — I6523 Occlusion and stenosis of bilateral carotid arteries: Secondary | ICD-10-CM | POA: Diagnosis not present

## 2018-11-25 DIAGNOSIS — R29818 Other symptoms and signs involving the nervous system: Secondary | ICD-10-CM | POA: Diagnosis not present

## 2018-11-25 DIAGNOSIS — R079 Chest pain, unspecified: Secondary | ICD-10-CM

## 2018-11-25 DIAGNOSIS — R011 Cardiac murmur, unspecified: Secondary | ICD-10-CM

## 2018-11-25 LAB — CBC
HCT: 47.1 % (ref 39.0–52.0)
Hemoglobin: 15.5 g/dL (ref 13.0–17.0)
MCH: 32.2 pg (ref 26.0–34.0)
MCHC: 32.9 g/dL (ref 30.0–36.0)
MCV: 97.7 fL (ref 80.0–100.0)
NRBC: 0 % (ref 0.0–0.2)
Platelets: 209 10*3/uL (ref 150–400)
RBC: 4.82 MIL/uL (ref 4.22–5.81)
RDW: 12.7 % (ref 11.5–15.5)
WBC: 9.5 10*3/uL (ref 4.0–10.5)

## 2018-11-25 LAB — COMPREHENSIVE METABOLIC PANEL
ALK PHOS: 99 U/L (ref 38–126)
ALT: 20 U/L (ref 0–44)
AST: 21 U/L (ref 15–41)
Albumin: 4.2 g/dL (ref 3.5–5.0)
Anion gap: 10 (ref 5–15)
BUN: 21 mg/dL (ref 8–23)
CO2: 24 mmol/L (ref 22–32)
Calcium: 8.9 mg/dL (ref 8.9–10.3)
Chloride: 103 mmol/L (ref 98–111)
Creatinine, Ser: 0.97 mg/dL (ref 0.61–1.24)
GFR calc non Af Amer: >60 mL/min (ref >60)
Glucose, Bld: 169 mg/dL — ABNORMAL HIGH (ref 70–99)
Potassium: 3.8 mmol/L (ref 3.5–5.1)
Sodium: 137 mmol/L (ref 135–145)
Total Bilirubin: 1 mg/dL (ref 0.3–1.2)
Total Protein: 7.7 g/dL (ref 6.5–8.1)

## 2018-11-25 LAB — DIFFERENTIAL
Abs Immature Granulocytes: 0.02 10*3/uL (ref 0.00–0.07)
Basophils Absolute: 0.1 10*3/uL (ref 0.0–0.1)
Basophils Relative: 1 %
Eosinophils Absolute: 0.4 10*3/uL (ref 0.0–0.5)
Eosinophils Relative: 4 %
Immature Granulocytes: 0 %
Lymphocytes Relative: 50 %
Lymphs Abs: 4.7 10*3/uL — ABNORMAL HIGH (ref 0.7–4.0)
MONO ABS: 0.8 10*3/uL (ref 0.1–1.0)
MONOS PCT: 8 %
Neutro Abs: 3.5 10*3/uL (ref 1.7–7.7)
Neutrophils Relative %: 37 %

## 2018-11-25 LAB — TROPONIN I
Troponin I: <0.03 ng/mL (ref <0.03)
Troponin I: <0.03 ng/mL (ref <0.03)
Troponin I: 0.03 ng/mL (ref <0.03)
Troponin I: <0.03 ng/mL (ref <0.03)

## 2018-11-25 LAB — URINALYSIS, ROUTINE W REFLEX MICROSCOPIC
BILIRUBIN URINE: NEGATIVE
Bacteria, UA: NONE SEEN
Glucose, UA: NEGATIVE mg/dL
Ketones, ur: NEGATIVE mg/dL
Leukocytes,Ua: NEGATIVE
Nitrite: NEGATIVE
Protein, ur: NEGATIVE mg/dL
Specific Gravity, Urine: 1.025 (ref 1.005–1.030)
pH: 5 (ref 5.0–8.0)

## 2018-11-25 LAB — RAPID URINE DRUG SCREEN, HOSP PERFORMED
Amphetamines: NOT DETECTED
Barbiturates: NOT DETECTED
Benzodiazepines: NOT DETECTED
Cocaine: NOT DETECTED
Opiates: NOT DETECTED
Tetrahydrocannabinol: NOT DETECTED

## 2018-11-25 LAB — PROTIME-INR
INR: 1 (ref 0.8–1.2)
Prothrombin Time: 13.1 seconds (ref 11.4–15.2)

## 2018-11-25 LAB — APTT: aPTT: 32 seconds (ref 24–36)

## 2018-11-25 LAB — ECHOCARDIOGRAM COMPLETE

## 2018-11-25 LAB — ETHANOL: Alcohol, Ethyl (B): <10 mg/dL (ref <10)

## 2018-11-25 MED ORDER — ASPIRIN 325 MG PO TABS
325.0000 mg | ORAL_TABLET | Freq: Every day | ORAL | Status: DC
  Administered 2018-11-25: 325 mg via ORAL
  Filled 2018-11-25: qty 1

## 2018-11-25 MED ORDER — ASPIRIN EC 81 MG PO TBEC: 81.0000 mg | DELAYED_RELEASE_TABLET | Freq: Every day | ORAL | Status: DC

## 2018-11-25 MED ORDER — HYDROCHLOROTHIAZIDE 25 MG PO TABS: 25.0000 mg | ORAL_TABLET | Freq: Every day | ORAL | 0 refills | Status: DC

## 2018-11-25 MED ORDER — LABETALOL HCL 5 MG/ML IV SOLN
10.0000 mg | Freq: Once | INTRAVENOUS | Status: AC
  Administered 2018-11-25: 10 mg via INTRAVENOUS
  Filled 2018-11-25: qty 4

## 2018-11-25 MED ORDER — STROKE: EARLY STAGES OF RECOVERY BOOK: Freq: Once | Status: DC

## 2018-11-25 MED ORDER — ACETAMINOPHEN 650 MG RE SUPP: 650.0000 mg | RECTAL | Status: DC | PRN

## 2018-11-25 MED ORDER — HYDROCHLOROTHIAZIDE 12.5 MG PO CAPS: 12.5000 mg | ORAL_CAPSULE | Freq: Every day | ORAL | Status: DC

## 2018-11-25 MED ORDER — ACETAMINOPHEN 325 MG PO TABS: 650.0000 mg | ORAL_TABLET | ORAL | Status: DC | PRN

## 2018-11-25 MED ORDER — ACETAMINOPHEN 160 MG/5ML PO SOLN: 650.0000 mg | ORAL | Status: DC | PRN

## 2018-11-25 MED ORDER — ASPIRIN 81 MG PO TBEC: 81.0000 mg | DELAYED_RELEASE_TABLET | Freq: Every day | ORAL | 0 refills | Status: DC

## 2018-11-25 MED ORDER — ASPIRIN 300 MG RE SUPP: 300.0000 mg | Freq: Every day | RECTAL | Status: DC

## 2018-11-25 MED ORDER — SENNOSIDES-DOCUSATE SODIUM 8.6-50 MG PO TABS
1.0000 | ORAL_TABLET | Freq: Every evening | ORAL | Status: DC | PRN
  Filled 2018-11-25: qty 1

## 2018-11-25 MED ORDER — AMLODIPINE BESYLATE 5 MG PO TABS: 5.0000 mg | ORAL_TABLET | Freq: Every day | ORAL | Status: DC

## 2018-11-25 MED ORDER — SODIUM CHLORIDE 0.9 % IV SOLN: INTRAVENOUS | Status: DC

## 2018-11-25 MED ORDER — ENOXAPARIN SODIUM 40 MG/0.4ML ~~LOC~~ SOLN
40.0000 mg | SUBCUTANEOUS | Status: DC
  Administered 2018-11-25: 40 mg via SUBCUTANEOUS
  Filled 2018-11-25: qty 0.4

## 2018-11-25 MED ORDER — HYDROCHLOROTHIAZIDE 25 MG PO TABS
25.0000 mg | ORAL_TABLET | Freq: Every day | ORAL | Status: DC
  Administered 2018-11-25: 25 mg via ORAL
  Filled 2018-11-25: qty 1

## 2018-11-25 NOTE — Discharge Instructions (Signed)
Phenylephrine oral tablet What is this medicine? PHENYLEPHRINE (fen il EF rin) is a decongestant. It is used to relieve a stuffy nose from allergies, colds, or sinus problems. This medicine may be used for other purposes; ask your health care provider or pharmacist if you have questions. COMMON BRAND NAME(S): Sudafed PE, Sudafed PE Congestion, Sudogest PE What should I tell my health care provider before I take this medicine? They need to know if you have any of the following conditions: -diabetes -heart disease -high blood pressure -prostate problems -taken an MAOI like Carbex, Eldepryl, Marplan, Nardil, or Parnate in last 14 days -thyroid disease -trouble passing urine or change in the amount of urine -an unusual or allergic reaction to phenylephrine, other decongestants, other medicines, foods, dyes, or preservatives -pregnant or trying to get pregnant -breast-feeding How should I use this medicine? Take this medicine by mouth with a glass of water. Follow the directions on the prescription label. Take this medicine with food, water, or milk to prevent stomach upset. Take your medicine at regular intervals. Do not take it more often than directed. Talk to your pediatrician regarding the use of this medicine in children. While this drug may be prescribed for children as young as 24 years old for selected conditions, precautions do apply. Patients over 25 years old may have a stronger reaction and need a smaller dose. Overdosage: If you think you have taken too much of this medicine contact a poison control center or emergency room at once. NOTE: This medicine is only for you. Do not share this medicine with others. What if I miss a dose? If you miss a dose, take it as soon as you can. If it is almost time for your next dose, take only that dose. Do not take double or extra doses. What may interact with this medicine? Do not take this medicine with any of the following  medications: -bromocriptine -cocaine -dihydroergotamine, ergotamine, ergoloid mesylates, methysergide, or ergot-type medication -MAOIs like Carbex, Eldepryl, Marplan, Nardil, and Parnate -other stimulant medicines This medicine may also interact with the following medications: -anesthesia -medicines for blood pressure -medicines for mental depression This list may not describe all possible interactions. Give your health care provider a list of all the medicines, herbs, non-prescription drugs, or dietary supplements you use. Also tell them if you smoke, drink alcohol, or use illegal drugs. Some items may interact with your medicine. What should I watch for while using this medicine? Tell your doctor or healthcare professional if your symptoms do not start to get better or if they get worse. See your doctor if you are not better in 7 days or if you have a fever. What side effects may I notice from receiving this medicine? Side effects that you should report to your doctor or health care professional as soon as possible: -allergic reactions like skin rash, itching or hives, swelling of the face, lips, or tongue -breathing problems -chest pain -fast, irregular heartbeat -feeling faint or lightheaded, falls -pain, tingling, numbness in the hands or feet -unusually weak or tired Side effects that usually do not require medical attention (report to your doctor or health care professional if they continue or are bothersome): -difficulty sleeping -headache -loss of appetite -nervousness -stomach upset, nausea This list may not describe all possible side effects. Call your doctor for medical advice about side effects. You may report side effects to FDA at 1-800-FDA-1088. Where should I keep my medicine? Keep out of the reach of children. Store at room  temperature between 15 and 30 degrees C (59 and 86 degrees F). Protect from light. Throw away any unused medicine after the expiration  date. NOTE: This sheet is a summary. It may not cover all possible information. If you have questions about this medicine, talk to your doctor, pharmacist, or health care provider.  2019 Elsevier/Gold Standard (2008-04-14 13:56:19)

## 2018-11-25 NOTE — ED Notes (Signed)
Pt understands we need urine sample, pt given urinal, unable to give sample at this time.

## 2018-11-25 NOTE — ED Notes (Signed)
CRITICAL VALUE ALERT  Critical Value:  Troponin 0.03  Date & Time Notied:  11/25/2018 1246  Provider Notified: Dr. Allena Katz   Orders Received/Actions taken: None yet

## 2018-11-25 NOTE — ED Triage Notes (Signed)
Pt C/O left sided facial numbness that began 20 minutes ago. Pt denies weakness in extremities. Pt denies speech changes. Pt A&O X 4.

## 2018-11-25 NOTE — Evaluation (Signed)
Physical Therapy Evaluation Patient Details Name: Jonathan Romero MRN: [id number] DOB: [birth year] Today's Date: 11/25/2018   History of Present Illness  Jonathan Romero  is a 73 y.o. male, with history of hyperlipidemia, intolerant to statins came to hospital with left-sided facial numbness which started last night.  Patient said he was watching TV and started suddenly experiencing left-sided facial numbness.  He checked his blood pressure and it was elevated to 201/105.  Patient also experienced burning sensation in the chest.  He drove himself to ED for further evaluation.    Clinical Impression  Patient functioning at baseline for functional mobility and gait.  Plan:  Patient discharged from physical therapy to care of nursing for ambulation daily as tolerated for length of stay.     Follow Up Recommendations No PT follow up    Equipment Recommendations  None recommended by PT    Recommendations for Other Services       Precautions / Restrictions Precautions Precautions: None Restrictions Weight Bearing Restrictions: No      Mobility  Bed Mobility Overal bed mobility: Independent                Transfers Overall transfer level: Independent                  Ambulation/Gait Ambulation/Gait assistance: Independent Gait Distance (Feet): 100 Feet Assistive device: None Gait Pattern/deviations: WFL(Within Functional Limits) Gait velocity: normal   General Gait Details: normal  Stairs            Wheelchair Mobility    Modified Rankin (Stroke Patients Only)       Balance Overall balance assessment: No apparent balance deficits (not formally assessed)                                           Pertinent Vitals/Pain Pain Assessment: No/denies pain    Home Living Family/patient expects to be discharged to:: Private residence Living Arrangements: Spouse/significant other Available Help at Discharge: Family;Available  PRN/intermittently Type of Home: House Home Access: Stairs to enter Entrance Stairs-Rails: Right(has grab bar) Entrance Stairs-Number of Steps: 3 Home Layout: Two level Home Equipment: Cane - single point;Bedside commode;Shower seat;Grab bars - tub/shower      Prior Function Level of Independence: Independent         Comments: Tourist information centre manager, drives     Hand Dominance   Dominant Hand: Right    Extremity/Trunk Assessment   Upper Extremity Assessment Upper Extremity Assessment: Defer to OT evaluation    Lower Extremity Assessment Lower Extremity Assessment: Overall WFL for tasks assessed    Cervical / Trunk Assessment Cervical / Trunk Assessment: Normal  Communication   Communication: No difficulties  Cognition Arousal/Alertness: Awake/alert Behavior During Therapy: WFL for tasks assessed/performed Overall Cognitive Status: Within Functional Limits for tasks assessed                                        General Comments      Exercises     Assessment/Plan    PT Assessment Patent does not need any further PT services  PT Problem List         PT Treatment Interventions      PT Goals (Current goals can be found in the Care Plan section)  Acute Rehab  PT Goals Patient Stated Goal: return home PT Goal Formulation: With patient/family Time For Goal Achievement: 11/25/18 Potential to Achieve Goals: Good    Frequency     Barriers to discharge        Co-evaluation               AM-PAC PT "6 Clicks" Mobility  Outcome Measure Help needed turning from your back to your side while in a flat bed without using bedrails?: None Help needed moving from lying on your back to sitting on the side of a flat bed without using bedrails?: None Help needed moving to and from a bed to a chair (including a wheelchair)?: None Help needed standing up from a chair using your arms (e.g., wheelchair or bedside chair)?: None Help needed to walk in  hospital room?: None Help needed climbing 3-5 steps with a railing? : None 6 Click Score: 24    End of Session   Activity Tolerance: Patient tolerated treatment well Patient left: in bed;with call bell/phone within reach;with family/visitor present Nurse Communication: Mobility status PT Visit Diagnosis: Unsteadiness on feet (R26.81);Other abnormalities of gait and mobility (R26.89);Muscle weakness (generalized) (M62.81)    Time: 0051-1021 PT Time Calculation (min) (ACUTE ONLY): 23 min   Charges:   PT Evaluation $PT Eval Moderate Complexity: 1 Mod PT Treatments $Therapeutic Activity: 23-37 mins        2:27 PM, 11/25/18 Ocie Bob, MPT Physical Therapist with [facility] [phone] office [phone] mobile phone

## 2018-11-25 NOTE — Progress Notes (Signed)
Did not add stroke care plan or document education because when patient arrived to floor doctor d/cd NIH.

## 2018-11-25 NOTE — ED Notes (Signed)
Pt back from MRI, hooked back up to cardiac monitor, vitals set for q68min

## 2018-11-25 NOTE — Progress Notes (Signed)
Removed IV-clean, dry, intact. Reviewed d/c paperwork with patient and daughter. Reviewed new meds and where to pick up. Answered all questions. Walked stable patient, belongings, and daughter to ED entrance where he drove home.

## 2018-11-25 NOTE — ED Provider Notes (Signed)
[facility] EMERGENCY DEPARTMENT Provider Note   CSN: [id number] Arrival date & time: 11/25/18  0245    History   Chief Complaint Chief Complaint  Patient presents with  . Numbness    HPI Jonathan Romero is a 73 y.o. male.     The history is provided by the patient.  Neurologic Problem  This is a new problem. The current episode started less than 1 hour ago. The problem occurs constantly. The problem has not changed since onset.Associated symptoms include chest pain and shortness of breath. Pertinent negatives include no abdominal pain and no headaches. Nothing aggravates the symptoms. Nothing relieves the symptoms. He has tried ASA for the symptoms. The treatment provided no relief.  PT Presents for 2 complaints.  He reports approximately 15 minutes prior to arrival, he had onset of left-sided facial numbness. Denies facial weakness, no slurred speech.  No arm or leg weakness or numbness.  No difficulty walking He denies any visual changes.  He checked his blood pressure and it was significantly elevated. He then reports he began having chest burning for several minutes that is now improving. He has otherwise been feeling well recently.  No recent flulike illness  Past Medical History:  Diagnosis Date  . Cellulitis   . Dyslipidemia   . Hyperlipidemia 2013   LDL particle number [id number] with LDL CALULATED at 151 and 2300 in 1988 was the small LDL particle number,which is very high- not tinterested in taking medications  . Systolic murmur     Patient Active Problem List   Diagnosis Date Noted  . Hyperlipidemia 04/14/2013  . Mild aortic stenosis 04/14/2013    Past Surgical History:  Procedure Laterality Date  . CATARACT EXTRACTION    . DOPPLER ECHOCARDIOGRAPHY  12/21/2012   EF 55-60%,  . DOPPLER ECHOCARDIOGRAPHY  12/16/2011   MILD AORTIC STENOSIS,PEAK AND MEAN GRADIENTS OF 18 AND 8 mmHg and a valve area of around 2 square cm.         Home Medications    Prior to  Admission medications   Medication Sig Start Date End Date Taking? Authorizing Provider  Colesevelam HCl [facility]) 3.75 G PACK Take 1 packet (3.8 g total) by mouth daily. 04/14/13   Hilty, Lisette Abu, MD  Multiple Vitamins-Minerals (MENS MULTIVITAMIN PLUS PO) Take by mouth.    [provider]    Family History Family History  Problem Relation Age of Onset  . Diabetes Mother   . Heart failure Father     Social History Social History   Tobacco Use  . Smoking status: Former Smoker    Last attempt to quit: 04/15/1983    Years since quitting: 35.6  . Smokeless tobacco: Never Used  Substance Use Topics  . Alcohol use: Yes    Comment: beer occasionally  . Drug use: No     Allergies   Statins   Review of Systems Review of Systems  Constitutional: Negative for fever.  Respiratory: Positive for shortness of breath.   Cardiovascular: Positive for chest pain.  Gastrointestinal: Negative for abdominal pain.  Neurological: Positive for numbness. Negative for weakness and headaches.  All other systems reviewed and are negative.    Physical Exam Updated Vital Signs BP (!) 201/100 (BP Location: Left Arm)   Pulse 96   Temp 97.7 F (36.5 C) (Oral)   Resp 14   SpO2 98%   Physical Exam  CONSTITUTIONAL: Well developed/well nourished HEAD: Normocephalic/atraumatic EYES: EOMI/PERRL, no nystagmus, no visual field deficit  no  ptosis ENMT: Mucous membranes moist NECK: supple no meningeal signs, no bruits CV: S1/S2 noted, murmur noted LUNGS: Lungs are clear to auscultation bilaterally, no apparent distress ABDOMEN: soft, nontender, no rebound or guarding GU:no cva tenderness NEURO:Awake/alert, face symmetric, no arm or leg drift is noted Equal 5/5 strength with shoulder abduction, elbow flex/extension, wrist flex/extension in upper extremities and equal hand grips bilaterally Equal 5/5 strength with hip flexion,knee flex/extension, foot dorsi/plantar flexion Cranial nerves  3/4/6/03/30/09/11/12 tested and intact (left facial numbness noted) Gait normal without ataxia No past pointing Sensation to light touch intact in all extremities NIHSS=1 (left facial numbness) EXTREMITIES: pulses normalx4, full ROM SKIN: warm, color normal PSYCH: no abnormalities of mood noted  ED Treatments / Results  Labs (all labs ordered are listed, but only abnormal results are displayed) Labs Reviewed  DIFFERENTIAL - Abnormal; Notable for the following components:      Result Value   Lymphs Abs 4.7 (*)    All other components within normal limits  COMPREHENSIVE METABOLIC PANEL - Abnormal; Notable for the following components:   Glucose, Bld 169 (*)    All other components within normal limits  ETHANOL  PROTIME-INR  APTT  CBC  TROPONIN I  RAPID URINE DRUG SCREEN, HOSP PERFORMED  URINALYSIS, ROUTINE W REFLEX MICROSCOPIC    EKG EKG Interpretation  Date/Time:  Thursday November 25 2018 03:00:02 EST Ventricular Rate:  96 PR Interval:    QRS Duration: 94 QT Interval:  377 QTC Calculation: 477 R Axis:   83 Text Interpretation:  Sinus rhythm Borderline right axis deviation Borderline ST depression, diffuse leads Borderline prolonged QT interval Confirmed by Zadie Rhine ([id number]) on 11/25/2018 3:03:16 AM   Radiology Dg Chest 2 View  Result Date: 11/25/2018 CLINICAL DATA:  Chest burning EXAM: CHEST - 2 VIEW COMPARISON:  10/28/2012 FINDINGS: No acute airspace disease or pleural effusion. Normal cardiomediastinal silhouette with aortic atherosclerosis. No pneumothorax. IMPRESSION: No active cardiopulmonary disease. Electronically Signed   By: Jasmine Pang M.D.   On: 11/25/2018 03:28   Ct Head Wo Contrast  Result Date: 11/25/2018 CLINICAL DATA:  Focal neuro deficit, < 6 hrs, stroke suspected. Left facial numbness, onset 20 minutes ago. EXAM: CT HEAD WITHOUT CONTRAST TECHNIQUE: Contiguous axial images were obtained from the base of the skull through the vertex without intravenous  contrast. COMPARISON:  None. FINDINGS: Brain: Brain volume is normal for age. No intracranial hemorrhage, mass effect, or midline shift. No hydrocephalus. The basilar cisterns are patent. No evidence of territorial infarct or acute ischemia. No extra-axial or intracranial fluid collection. Vascular: No hyperdense vessel or unexpected calcification. Skull: No fracture or focal lesion. Sinuses/Orbits: Paranasal sinuses and mastoid air cells are clear. The visualized orbits are unremarkable. Other: None. IMPRESSION: No acute intracranial abnormality. Electronically Signed   By: Narda Rutherford M.D.   On: 11/25/2018 03:27    Procedures Procedures  CRITICAL CARE Performed by: Joya Gaskins Total critical care time: 31 minutes Critical care time was exclusive of separately billable procedures and treating other patients. Critical care was necessary to treat or prevent imminent or life-threatening deterioration. Critical care was time spent personally by me on the following activities: development of treatment plan with patient and/or surrogate as well as nursing, discussions with consultants, evaluation of patient's response to treatment, examination of patient, obtaining history from patient or surrogate, ordering and performing treatments and interventions, ordering and review of laboratory studies, ordering and review of radiographic studies, pulse oximetry and re-evaluation of patient's condition.   Medications  Ordered in ED Medications  labetalol (NORMODYNE,TRANDATE) injection 10 mg (10 mg Intravenous Given 11/25/18 0333)     Initial Impression / Assessment and Plan / ED Course  I have reviewed the triage vital signs and the nursing notes.  Pertinent labs & imaging results that were available during my care of the patient were reviewed by me and considered in my medical decision making (see chart for details).        3:04 AM Seen on arrival for 2 complaints.  He reports left-sided  facial numbness, as well as chest burning. He is in no acute distress. NIHSS  tPA in stroke considered but not given due to: Rapid improvement/severity mild  3:48 AM Patient with no change.  He is in no acute distress.  Significant hypertension is noted.  Labetalol has been ordered CT head negative He already took ASA prior to arrival 4:21 AM Patient reports numbness is improving.  He has no other focal weakness. CT head negative.  He will be admitted Patient reports symptoms improving. He is resting comfortably, unlikely be aortic dissection.  No hypoxia to suggest PE 4:34 AM D/w with Dr. Sharl Ma for admission Final Clinical Impressions(s) / ED Diagnoses   Final diagnoses:  Chest pain, rule out acute myocardial infarction  Numbness    ED Discharge Orders    None       Zadie Rhine, MD 11/25/18 [phone]

## 2018-11-25 NOTE — Progress Notes (Signed)
*  PRELIMINARY RESULTS* Echocardiogram 2D Echocardiogram has been performed.  Jonathan Romero 11/25/2018, 2:57 PM

## 2018-11-25 NOTE — H&P (Signed)
TRH H&P    Patient Demographics:    Rashed Hapner, is a 73 y.o. male  MRN: [id number]  DOB - 07/16/1946  Admit Date - 11/25/2018  Referring MD/NP/PA: Zadie Rhine  Outpatient Primary MD for the patient is Assunta Found, MD  Patient coming from: Home  Chief complaint-left facial numbness   HPI:    Yosiel Rottinghaus  is a 73 y.o. male, with history of hyperlipidemia, intolerant to statins came to hospital with left-sided facial numbness which started last night.  Patient said he was watching TV and started suddenly experiencing left-sided facial numbness.  He checked his blood pressure and it was elevated to 201/105.  Patient also experienced burning sensation in the chest.  He drove himself to ED for further evaluation. Patient denies slurred speech, no facial weakness.  No arm or leg weakness.  No visual changes. Patient symptom improved when he came to the ED.  TPA was not considered due to rapid improvement and mild symptoms only.  Patient took aspirin before coming to the ED. He denies shortness of breath. Denies nausea vomiting or diarrhea. CT head was negative for stroke. No previous history of stroke or seizures.    Review of systems:    In addition to the HPI above,    All other systems reviewed and are negative.    Past History of the following :    Past Medical History:  Diagnosis Date  . Cellulitis   . Dyslipidemia   . Hyperlipidemia 2013   LDL particle number [id number] with LDL CALULATED at 151 and 2300 in 1988 was the small LDL particle number,which is very high- not tinterested in taking medications  . Systolic murmur       Past Surgical History:  Procedure Laterality Date  . CATARACT EXTRACTION    . DOPPLER ECHOCARDIOGRAPHY  12/21/2012   EF 55-60%,  . DOPPLER ECHOCARDIOGRAPHY  12/16/2011   MILD AORTIC STENOSIS,PEAK AND MEAN GRADIENTS OF 18 AND 8 mmHg and a valve area of around 2  square cm.       Social History:      Social History   Tobacco Use  . Smoking status: Former Smoker    Last attempt to quit: 04/15/1983    Years since quitting: 35.6  . Smokeless tobacco: Never Used  Substance Use Topics  . Alcohol use: Yes    Comment: beer occasionally       Family History :     Family History  Problem Relation Age of Onset  . Diabetes Mother   . Heart failure Father       Home Medications:   Prior to Admission medications   Medication Sig Start Date End Date Taking? Authorizing Provider  Colesevelam HCl [facility]) 3.75 G PACK Take 1 packet (3.8 g total) by mouth daily. 04/14/13   Hilty, Lisette Abu, MD  Multiple Vitamins-Minerals (MENS MULTIVITAMIN PLUS PO) Take by mouth.    [provider]     Allergies:     Allergies  Allergen Reactions  . Statins  Physical Exam:   Vitals  Blood pressure (!) 169/84, pulse 74, temperature 98.3 F (36.8 C), temperature source Oral, resp. rate 11, SpO2 96 %.  1.  General: Appears in no acute distress  2. Psychiatric: Alert, oriented x3, intact insight and judgment  3. Neurologic: Cranial nerves II through grossly intact, mild facial numbness on left, motor strength 5/5 in all extremities  4. HEENMT:  Atraumatic normocephalic, oral mucosa is pink and moist  5. Respiratory : Clear to auscultation bilaterally, no wheezing or crackles.  6. Cardiovascular : S1-S2, regular, grade 3/6 systolic murmur auscultated at apex, right upper second intercostal space  7. Gastrointestinal:  Abdomen is  soft, nontender, no organomegaly     Data Review:    CBC Recent Labs  Lab 11/25/18 0257  WBC 9.5  HGB 15.5  HCT 47.1  PLT 209  MCV 97.7  MCH 32.2  MCHC 32.9  RDW 12.7  LYMPHSABS 4.7*  MONOABS 0.8  EOSABS 0.4  BASOSABS 0.1   ------------------------------------------------------------------------------------------------------------------  Results for orders placed or performed  during the hospital encounter of 11/25/18 (from the past 48 hour(s))  Ethanol     Status: None   Collection Time: 11/25/18  2:57 AM  Result Value Ref Range   Alcohol, Ethyl (B) <10 <10 mg/dL    Comment: (NOTE) Lowest detectable limit for serum alcohol is 10 mg/dL. For medical purposes only. Performed at [facility], [address] [place], [place] [address]   Protime-INR     Status: None   Collection Time: 11/25/18  2:57 AM  Result Value Ref Range   Prothrombin Time 13.1 11.4 - 15.2 seconds   INR 1.0 0.8 - 1.2    Comment: (NOTE) INR goal varies based on device and disease states. Performed at [facility], [address], [place], [place] [address]   APTT     Status: None   Collection Time: 11/25/18  2:57 AM  Result Value Ref Range   aPTT 32 24 - 36 seconds    Comment: Performed at [facility], [address] [place], [place] [address]  CBC     Status: None   Collection Time: 11/25/18  2:57 AM  Result Value Ref Range   WBC 9.5 4.0 - 10.5 K/uL   RBC 4.82 4.22 - 5.81 MIL/uL   Hemoglobin 15.5 13.0 - 17.0 g/dL   HCT 62.9 47.6 - 54.6 %   MCV 97.7 80.0 - 100.0 fL   MCH 32.2 26.0 - 34.0 pg   MCHC 32.9 30.0 - 36.0 g/dL   RDW 50.3 54.6 - 56.8 %   Platelets 209 150 - 400 K/uL   nRBC 0.0 0.0 - 0.2 %    Comment: Performed at [facility], [address] [place], [place] [address]  Differential     Status: Abnormal   Collection Time: 11/25/18  2:57 AM  Result Value Ref Range   Neutrophils Relative % 37 %   Neutro Abs 3.5 1.7 - 7.7 K/uL   Lymphocytes Relative 50 %   Lymphs Abs 4.7 (H) 0.7 - 4.0 K/uL   Monocytes Relative 8 %   Monocytes Absolute 0.8 0.1 - 1.0 K/uL   Eosinophils Relative 4 %   Eosinophils Absolute 0.4 0.0 - 0.5 K/uL   Basophils Relative 1 %   Basophils Absolute 0.1 0.0 - 0.1 K/uL   Immature Granulocytes 0 %   Abs Immature Granulocytes 0.02 0.00 - 0.07 K/uL    Comment: Performed at [facility], [address]  [place], [place] [address]  Comprehensive metabolic  panel     Status: Abnormal   Collection Time: 11/25/18  2:57 AM  Result Value Ref Range   Sodium 137 135 - 145 mmol/L   Potassium 3.8 3.5 - 5.1 mmol/L   Chloride 103 98 - 111 mmol/L   CO2 24 22 - 32 mmol/L   Glucose, Bld 169 (H) 70 - 99 mg/dL   BUN 21 8 - 23 mg/dL   Creatinine, Ser 6.04 0.61 - 1.24 mg/dL   Calcium 8.9 8.9 - 54.0 mg/dL   Total Protein 7.7 6.5 - 8.1 g/dL   Albumin 4.2 3.5 - 5.0 g/dL   AST 21 15 - 41 U/L   ALT 20 0 - 44 U/L   Alkaline Phosphatase 99 38 - 126 U/L   Total Bilirubin 1.0 0.3 - 1.2 mg/dL   GFR calc non Af Amer >60 >60 mL/min   GFR calc Af Amer >60 >60 mL/min   Anion gap 10 5 - 15    Comment: Performed at [facility], [address], [place], [place] [address]  Troponin I - ONCE - STAT     Status: None   Collection Time: 11/25/18  2:57 AM  Result Value Ref Range   Troponin I <0.03 <0.03 ng/mL    Comment: Performed at [facility], [address], [place], [place] [address]    Chemistries  Recent Labs  Lab 11/25/18 0257  NA 137  K 3.8  CL 103  CO2 24  GLUCOSE 169*  BUN 21  CREATININE 0.97  CALCIUM 8.9  AST 21  ALT 20  ALKPHOS 99  BILITOT 1.0   ------------------------------------------------------------------------------------------------------------------  ------------------------------------------------------------------------------------------------------------------ GFR: CrCl cannot be calculated (Unknown ideal weight.). Liver Function Tests: Recent Labs  Lab 11/25/18 0257  AST 21  ALT 20  ALKPHOS 99  BILITOT 1.0  PROT 7.7  ALBUMIN 4.2   No results for input(s): LIPASE, AMYLASE in the last 168 hours. No results for input(s): AMMONIA in the last 168 hours. Coagulation Profile: Recent Labs  Lab 11/25/18 0257  INR 1.0   Cardiac Enzymes: Recent Labs  Lab 11/25/18 0257  TROPONINI <0.03   ----------------------------------------------------------------------------------------- Urine analysis: No results found for:  COLORURINE, APPEARANCEUR, LABSPEC, PHURINE, GLUCOSEU, HGBUR, BILIRUBINUR, KETONESUR, PROTEINUR, UROBILINOGEN, NITRITE, LEUKOCYTESUR    Imaging Results:    Dg Chest 2 View  Result Date: 11/25/2018 CLINICAL DATA:  Chest burning EXAM: CHEST - 2 VIEW COMPARISON:  10/28/2012 FINDINGS: No acute airspace disease or pleural effusion. Normal cardiomediastinal silhouette with aortic atherosclerosis. No pneumothorax. IMPRESSION: No active cardiopulmonary disease. Electronically Signed   By: Jasmine Pang M.D.   On: 11/25/2018 03:28   Ct Head Wo Contrast  Result Date: 11/25/2018 CLINICAL DATA:  Focal neuro deficit, < 6 hrs, stroke suspected. Left facial numbness, onset 20 minutes ago. EXAM: CT HEAD WITHOUT CONTRAST TECHNIQUE: Contiguous axial images were obtained from the base of the skull through the vertex without intravenous contrast. COMPARISON:  None. FINDINGS: Brain: Brain volume is normal for age. No intracranial hemorrhage, mass effect, or midline shift. No hydrocephalus. The basilar cisterns are patent. No evidence of territorial infarct or acute ischemia. No extra-axial or intracranial fluid collection. Vascular: No hyperdense vessel or unexpected calcification. Skull: No fracture or focal lesion. Sinuses/Orbits: Paranasal sinuses and mastoid air cells are clear. The visualized orbits are unremarkable. Other: None. IMPRESSION: No acute intracranial abnormality. Electronically Signed   By: Narda Rutherford M.D.   On: 11/25/2018 03:27    My personal review  of EKG: Rhythm NSR, nonspecific ST changes   Assessment & Plan:    Active Problems:   Left facial numbness   1. Left facial numbness-likely from TIA versus hypertensive encephalopathy.  Will obtain echocardiogram, carotid Doppler, lipid profile, hemoglobin A1c.  Continue aspirin 300 mg p.o. daily.  2. Hypertensive urgency-blood pressure improved after patient received labetalol in the ED.  Will allow permissive hypertension, once stroke is ruled  out patient needs to be started on antihypertensive medications.  3. Hyperlipidemia-patient has history of intolerance to statins.  Lipid profile has been ordered.    4. Chest pain-patient complained of burning sensation in the chest.  EKG shows nonspecific ST changes.  Will cycle troponin every 6 hours x3.    DVT Prophylaxis-   Lovenox   AM Labs Ordered, also please review Full Orders  Family Communication: Admission, patients condition and plan of care including tests being ordered have been discussed with the patient and  who indicate understanding and agree with the plan and Code Status.  Code Status: Full code  Admission status: Inpatient: Based on patients clinical presentation and evaluation of above clinical data, I have made determination that patient meets Inpatient criteria at this time.  Time spent in minutes : 60 minutes   Meredeth Ide M.D on 11/25/2018 at 5:16 AM

## 2018-12-03 NOTE — Discharge Summary (Addendum)
[facility] Discharge Summary   Patient: Jonathan Romero RUE:[id number]   PCP: Assunta Found, MD DOB: 1946/03/20   Date of admission: 11/25/2018   Date of discharge: 11/25/2018     Discharge Diagnoses:  Left facial numbness TIA Hypertensive urgency  Admitted From: home Disposition:  home  Recommendations for Outpatient Follow-up:  1. Please follow-up with PCP in 1 week.  Diet recommendation: cardiac diet  Activity: The patient is advised to gradually reintroduce usual activities.  Discharge Condition: good  Code Status: full code  History of present illness: As per the H and P dictated on admission, "Jonathan Romero  is a 73 y.o. male, with history of hyperlipidemia, intolerant to statins came to hospital with left-sided facial numbness which started last night.  Patient said he was watching TV and started suddenly experiencing left-sided facial numbness.  He checked his blood pressure and it was elevated to 201/105.  Patient also experienced burning sensation in the chest.  He drove himself to ED for further evaluation. Patient denies slurred speech, no facial weakness.  No arm or leg weakness.  No visual changes. Patient symptom improved when he came to the ED.  TPA was not considered due to rapid improvement and mild symptoms only.  Patient took aspirin before coming to the ED. He denies shortness of breath. Denies nausea vomiting or diarrhea. CT head was negative for stroke. No previous history of stroke or seizures."  Hospital Course:  Summary of his active problems in the hospital is as following. 1. Left facial numbness-likely from TIA versus hypertensive encephalopathy.    CT of the head and MRI of the brain negative for any acute abnormality.  Also MRA negative for intracranial stenosis.  Carotid Dopplers were also negative for any significant stenosis.  Patient symptoms completely resolved at the time of my evaluation. will recommend to continue 81 mg aspirin. Also  recommended to follow-up with PCP in 1 week to get lipid profile and hemoglobin A1c.  2. Hypertensive urgency-blood pressure improved after patient received labetalol in the ED. since MRI has ruled out stroke, will attempt to control blood pressure.  Patient is not on any blood pressure medication at home will add HCTZ given the history of lower extremity swelling.  3. Hyperlipidemia-patient has history of intolerance to statins.  Lipid profile is recommended outpatient with PCP.  4. Atypical noncardiac chest pain-patient complained of burning sensation in the chest.  EKG shows nonspecific ST changes.    Troponins are negative.  Echocardiogram preserved EF without any significant abnormality.  No further work-up needed.  Patient was ambulatory without any assistance. seen by physical therapy, who recommended no PT follow up needed. On the day of the discharge the patient's vitals were stable , and no other acute medical condition were reported by patient. the patient was felt safe to be discharge at home with family.  Consultants: none Procedures: Echocardiogram   DISCHARGE MEDICATION: Allergies as of 11/25/2018      Reactions   Statins       Medication List    STOP taking these medications   aspirin 325 MG tablet Replaced by:  aspirin 81 MG EC tablet     TAKE these medications   aspirin 81 MG EC tablet Take 1 tablet (81 mg total) by mouth daily. Replaces:  aspirin 325 MG tablet   hydrochlorothiazide 25 MG tablet Commonly known as:  HYDRODIURIL Take 1 tablet (25 mg total) by mouth daily.      Allergies  Allergen Reactions  Statins    Discharge Instructions    Diet - low sodium heart healthy   Complete by:  As directed    Discharge instructions   Complete by:  As directed    It is important that you read the given instructions as well as go over your medication list with RN to help you understand your care after this hospitalization.  Discharge Instructions: Please  follow-up with PCP in 1-2 weeks  Please request your primary care physician to go over all Hospital Tests and Procedure/Radiological results at the follow up. Please get all Hospital records sent to your PCP by signing hospital release before you go home.   Do not take more than prescribed Pain, Sleep and Anxiety Medications. You were cared for by a hospitalist during your hospital stay. If you have any questions about your discharge medications or the care you received while you were in the hospital after you are discharged, you can call the unit @UNIT @ you were admitted to and ask to speak with the hospitalist on call if the hospitalist that took care of you is not available.  Once you are discharged, your primary care physician will handle any further medical issues. Please note that NO REFILLS for any discharge medications will be authorized once you are discharged, as it is imperative that you return to your primary care physician (or establish a relationship with a primary care physician if you do not have one) for your aftercare needs so that they can reassess your need for medications and monitor your lab values. You Must read complete instructions/literature along with all the possible adverse reactions/side effects for all the Medicines you take and that have been prescribed to you. Take any new Medicines after you have completely understood and accept all the possible adverse reactions/side effects. Wear Seat belts while driving. If you have smoked or chewed Tobacco in the last 2 yrs please stop smoking and/or stop any Recreational drug use.  If you drink alcohol, please moderate the use and do not drive, operating heavy machinery, perform activities at heights, swimming or participation in water activities or provide baby sitting services under influence.   Increase activity slowly   Complete by:  As directed      Discharge Exam: There were no vitals filed for this visit. Vitals:    11/25/18 1300 11/25/18 1330  BP: (!) 159/78 (!) 141/76  Pulse:  69  Resp: 14 15  Temp:    SpO2: 100% 96%   General: Appear in no distress, no Rash; Oral Mucosa moist. Cardiovascular: S1 and S2 Present, no Murmur, no JVD Respiratory: Bilateral Air entry present and Clear to Auscultation, no Crackles, no wheezes Abdomen: Bowel Sound present, Soft and no tenderness Extremities: no Pedal edema, no calf tenderness Neurology: Grossly no focal neuro deficit.  The results of significant diagnostics from this hospitalization (including imaging, microbiology, ancillary and laboratory) are listed below for reference.    Significant Diagnostic Studies: Dg Chest 2 View  Result Date: 11/25/2018 CLINICAL DATA:  Chest burning EXAM: CHEST - 2 VIEW COMPARISON:  10/28/2012 FINDINGS: No acute airspace disease or pleural effusion. Normal cardiomediastinal silhouette with aortic atherosclerosis. No pneumothorax. IMPRESSION: No active cardiopulmonary disease. Electronically Signed   By: Jasmine Pang M.D.   On: 11/25/2018 03:28   Ct Head Wo Contrast  Result Date: 11/25/2018 CLINICAL DATA:  Focal neuro deficit, < 6 hrs, stroke suspected. Left facial numbness, onset 20 minutes ago. EXAM: CT HEAD WITHOUT CONTRAST TECHNIQUE: Contiguous axial images  were obtained from the base of the skull through the vertex without intravenous contrast. COMPARISON:  None. FINDINGS: Brain: Brain volume is normal for age. No intracranial hemorrhage, mass effect, or midline shift. No hydrocephalus. The basilar cisterns are patent. No evidence of territorial infarct or acute ischemia. No extra-axial or intracranial fluid collection. Vascular: No hyperdense vessel or unexpected calcification. Skull: No fracture or focal lesion. Sinuses/Orbits: Paranasal sinuses and mastoid air cells are clear. The visualized orbits are unremarkable. Other: None. IMPRESSION: No acute intracranial abnormality. Electronically Signed   By: Narda Rutherford M.D.    On: 11/25/2018 03:27   Mr Brain Wo Contrast  Result Date: 11/25/2018 CLINICAL DATA:  73 year old male with acute onset left side facial numbness this morning. Hypertensive on presentation, 201 systolic. EXAM: MRI HEAD WITHOUT CONTRAST MRA HEAD WITHOUT CONTRAST TECHNIQUE: Multiplanar, multiecho pulse sequences of the brain and surrounding structures were obtained without intravenous contrast. Angiographic images of the head were obtained using MRA technique without contrast. COMPARISON:  Head CT without contrast earlier today. FINDINGS: MRI HEAD FINDINGS Brain: Cerebral volume is within normal limits for age. No restricted diffusion to suggest acute infarction. No midline shift, mass effect, evidence of mass lesion, ventriculomegaly, extra-axial collection or acute intracranial hemorrhage. Cervicomedullary junction and pituitary are within normal limits. Wallace Cullens and white matter signal is within normal limits for age throughout the brain. No cortical encephalomalacia or chronic cerebral blood products identified. Vascular: Major intracranial vascular flow voids are preserved. Skull and upper cervical spine: Negative visible cervical spine. Visualized bone marrow signal is within normal limits. Sinuses/Orbits: Atrophied bilateral globes, but otherwise negative orbits soft tissues. Paranasal sinuses are clear. Other: Minimal fluid in the inferior left mastoids. Mastoids otherwise well pneumatized. Grossly normal visible internal auditory structures. Scalp and face soft tissues appear negative. MRA HEAD FINDINGS Antegrade flow in the posterior circulation. Normal distal vertebral arteries, the left is mildly dominant. Normal PICA origins, vertebrobasilar junction. Patent basilar artery, SCA and PCA origins. Posterior communicating arteries are diminutive or absent. Bilateral PCA branches are within normal limits. Antegrade flow in both ICA siphons. No siphon stenosis. Patent carotid termini, MCA and ACA origins.  Diminutive or absent anterior communicating artery. Visible ACA branches are within normal limits. Mild left MCA M1 segment irregularity without stenosis. Otherwise MCA M1 segments, bifurcations, visible MCA branches are within normal limits. IMPRESSION: 1. No acute intracranial abnormality and normal for age MRI appearance of the brain. 2.  Negative intracranial MRA. 3. Chronic appearing atrophy of both globes. Electronically Signed   By: Odessa Fleming M.D.   On: 11/25/2018 09:32   US Carotid Bilateral (at [facility] Only)  Result Date: 11/25/2018 CLINICAL DATA:  TIA EXAM: BILATERAL CAROTID DUPLEX ULTRASOUND TECHNIQUE: Wallace Cullens scale imaging, color Doppler and duplex ultrasound were performed of bilateral carotid and vertebral arteries in the neck. COMPARISON:  None. FINDINGS: Criteria: Quantification of carotid stenosis is based on velocity parameters that correlate the residual internal carotid diameter with NASCET-based stenosis levels, using the diameter of the distal internal carotid lumen as the denominator for stenosis measurement. The following velocity measurements were obtained: RIGHT ICA: 72 cm/sec CCA: 88 cm/sec SYSTOLIC ICA/CCA RATIO:  0.8 ECA: 63 cm/sec LEFT ICA: 78 cm/sec CCA: 82 cm/sec SYSTOLIC ICA/CCA RATIO:  1.0 ECA: 105 cm/sec RIGHT CAROTID ARTERY: Mild calcified plaque in the bulb. Low resistance internal carotid Doppler pattern is preserved. RIGHT VERTEBRAL ARTERY:  Antegrade. LEFT CAROTID ARTERY: Mild irregular calcified plaque in the bulb. Low resistance internal carotid Doppler pattern  is preserved. LEFT VERTEBRAL ARTERY:  Antegrade. IMPRESSION: Less than 50% stenosis in the right and left internal carotid arteries. Electronically Signed   By: Jolaine Click M.D.   On: 11/25/2018 09:02   Mr Maxine Glenn Head/brain WF Cm  Result Date: 11/25/2018 CLINICAL DATA:  73 year old male with acute onset left side facial numbness this morning. Hypertensive on presentation, 201 systolic. EXAM: MRI HEAD WITHOUT  CONTRAST MRA HEAD WITHOUT CONTRAST TECHNIQUE: Multiplanar, multiecho pulse sequences of the brain and surrounding structures were obtained without intravenous contrast. Angiographic images of the head were obtained using MRA technique without contrast. COMPARISON:  Head CT without contrast earlier today. FINDINGS: MRI HEAD FINDINGS Brain: Cerebral volume is within normal limits for age. No restricted diffusion to suggest acute infarction. No midline shift, mass effect, evidence of mass lesion, ventriculomegaly, extra-axial collection or acute intracranial hemorrhage. Cervicomedullary junction and pituitary are within normal limits. Wallace Cullens and white matter signal is within normal limits for age throughout the brain. No cortical encephalomalacia or chronic cerebral blood products identified. Vascular: Major intracranial vascular flow voids are preserved. Skull and upper cervical spine: Negative visible cervical spine. Visualized bone marrow signal is within normal limits. Sinuses/Orbits: Atrophied bilateral globes, but otherwise negative orbits soft tissues. Paranasal sinuses are clear. Other: Minimal fluid in the inferior left mastoids. Mastoids otherwise well pneumatized. Grossly normal visible internal auditory structures. Scalp and face soft tissues appear negative. MRA HEAD FINDINGS Antegrade flow in the posterior circulation. Normal distal vertebral arteries, the left is mildly dominant. Normal PICA origins, vertebrobasilar junction. Patent basilar artery, SCA and PCA origins. Posterior communicating arteries are diminutive or absent. Bilateral PCA branches are within normal limits. Antegrade flow in both ICA siphons. No siphon stenosis. Patent carotid termini, MCA and ACA origins. Diminutive or absent anterior communicating artery. Visible ACA branches are within normal limits. Mild left MCA M1 segment irregularity without stenosis. Otherwise MCA M1 segments, bifurcations, visible MCA branches are within normal  limits. IMPRESSION: 1. No acute intracranial abnormality and normal for age MRI appearance of the brain. 2.  Negative intracranial MRA. 3. Chronic appearing atrophy of both globes. Electronically Signed   By: Odessa Fleming M.D.   On: 11/25/2018 09:32    Microbiology: No results found for this or any previous visit (from the past 240 hour(s)).   Labs: CBC: No results for input(s): WBC, NEUTROABS, HGB, HCT, MCV, PLT in the last 168 hours. Basic Metabolic Panel: No results for input(s): NA, K, CL, CO2, GLUCOSE, BUN, CREATININE, CALCIUM, MG, PHOS in the last 168 hours. Liver Function Tests: No results for input(s): AST, ALT, ALKPHOS, BILITOT, PROT, ALBUMIN in the last 168 hours. No results for input(s): LIPASE, AMYLASE in the last 168 hours. No results for input(s): AMMONIA in the last 168 hours. Cardiac Enzymes: No results for input(s): CKTOTAL, CKMB, CKMBINDEX, TROPONINI in the last 168 hours. BNP (last 3 results) No results for input(s): BNP in the last 8760 hours. CBG: No results for input(s): GLUCAP in the last 168 hours. Time spent: 35 minutes  Signed:  Lynden Oxford  [facility] 11/25/2018

## 2018-12-08 ENCOUNTER — Emergency Department (HOSPITAL_COMMUNITY)
Admission: EM | Admit: 2018-12-08 | Discharge: 2018-12-08 | Disposition: A | Discharge status: Home / Self Care | Payer: Medicare Other | Attending: Emergency Medicine | Admitting: Emergency Medicine

## 2018-12-08 ENCOUNTER — Encounter (HOSPITAL_COMMUNITY): Payer: Self-pay | Admitting: Emergency Medicine

## 2018-12-08 ENCOUNTER — Other Ambulatory Visit: Payer: Self-pay

## 2018-12-08 ENCOUNTER — Emergency Department (HOSPITAL_COMMUNITY): Payer: Medicare Other

## 2018-12-08 DIAGNOSIS — Z7982 Long term (current) use of aspirin: Secondary | ICD-10-CM | POA: Diagnosis not present

## 2018-12-08 DIAGNOSIS — R011 Cardiac murmur, unspecified: Secondary | ICD-10-CM | POA: Insufficient documentation

## 2018-12-08 DIAGNOSIS — R200 Anesthesia of skin: Secondary | ICD-10-CM | POA: Diagnosis not present

## 2018-12-08 DIAGNOSIS — R52 Pain, unspecified: Secondary | ICD-10-CM | POA: Diagnosis not present

## 2018-12-08 DIAGNOSIS — Z87891 Personal history of nicotine dependence: Secondary | ICD-10-CM | POA: Diagnosis not present

## 2018-12-08 DIAGNOSIS — Z79899 Other long term (current) drug therapy: Secondary | ICD-10-CM | POA: Diagnosis not present

## 2018-12-08 DIAGNOSIS — R29810 Facial weakness: Secondary | ICD-10-CM | POA: Diagnosis not present

## 2018-12-08 DIAGNOSIS — I10 Essential (primary) hypertension: Secondary | ICD-10-CM | POA: Diagnosis not present

## 2018-12-08 DIAGNOSIS — R202 Paresthesia of skin: Secondary | ICD-10-CM | POA: Diagnosis not present

## 2018-12-08 LAB — DIFFERENTIAL
Abs Immature Granulocytes: 0.01 10*3/uL (ref 0.00–0.07)
Basophils Absolute: 0.1 10*3/uL (ref 0.0–0.1)
Basophils Relative: 2 %
Eosinophils Absolute: 0.4 10*3/uL (ref 0.0–0.5)
Eosinophils Relative: 6 %
Immature Granulocytes: 0 %
LYMPHS PCT: 36 %
Lymphs Abs: 2.3 10*3/uL (ref 0.7–4.0)
Monocytes Absolute: 0.5 10*3/uL (ref 0.1–1.0)
Monocytes Relative: 8 %
NEUTROS ABS: 3.1 10*3/uL (ref 1.7–7.7)
Neutrophils Relative %: 48 %

## 2018-12-08 LAB — COMPREHENSIVE METABOLIC PANEL
ALK PHOS: 77 U/L (ref 38–126)
ALT: 28 U/L (ref 0–44)
AST: 27 U/L (ref 15–41)
Albumin: 3.9 g/dL (ref 3.5–5.0)
Anion gap: 10 (ref 5–15)
BUN: 22 mg/dL (ref 8–23)
CO2: 26 mmol/L (ref 22–32)
Calcium: 9.4 mg/dL (ref 8.9–10.3)
Chloride: 98 mmol/L (ref 98–111)
Creatinine, Ser: 0.92 mg/dL (ref 0.61–1.24)
GFR calc Af Amer: >60 mL/min (ref >60)
GFR calc non Af Amer: >60 mL/min (ref >60)
Glucose, Bld: 115 mg/dL — ABNORMAL HIGH (ref 70–99)
Potassium: 3.4 mmol/L — ABNORMAL LOW (ref 3.5–5.1)
Sodium: 134 mmol/L — ABNORMAL LOW (ref 135–145)
Total Bilirubin: 1.2 mg/dL (ref 0.3–1.2)
Total Protein: 7.4 g/dL (ref 6.5–8.1)

## 2018-12-08 LAB — CBC
HEMATOCRIT: 43.2 % (ref 39.0–52.0)
Hemoglobin: 14.9 g/dL (ref 13.0–17.0)
MCH: 33.1 pg (ref 26.0–34.0)
MCHC: 34.5 g/dL (ref 30.0–36.0)
MCV: 96 fL (ref 80.0–100.0)
Platelets: 193 10*3/uL (ref 150–400)
RBC: 4.5 MIL/uL (ref 4.22–5.81)
RDW: 12.3 % (ref 11.5–15.5)
WBC: 6.4 10*3/uL (ref 4.0–10.5)
nRBC: 0 % (ref 0.0–0.2)

## 2018-12-08 LAB — PROTIME-INR
INR: 1 (ref 0.8–1.2)
Prothrombin Time: 12.8 seconds (ref 11.4–15.2)

## 2018-12-08 LAB — APTT: aPTT: 29 seconds (ref 24–36)

## 2018-12-08 LAB — ETHANOL: Alcohol, Ethyl (B): <10 mg/dL (ref <10)

## 2018-12-08 NOTE — ED Notes (Signed)
EDP notified of pt 

## 2018-12-08 NOTE — ED Provider Notes (Signed)
[facility] EMERGENCY DEPARTMENT Provider Note   CSN: [id number] Arrival date & time: 12/08/18  1502    History   Chief Complaint Chief Complaint  Patient presents with  . Numbness    HPI Jonathan Romero is a 73 y.o. male.   HPI Patient presented to the ED for evaluation of facial numbness.  Patient states he went to go take a nap this afternoon.  He he started feeling numbness on the left side of his face.  Patient states it feels like it is like he was at the dentist.  He denies any weakness.  He is not have any difficulty speaking.  He denies any difficulty with his vision or balance.  Patient was in the hospital recently earlier this month for the same symptoms.  He had a negative work-up including an MRI and MRI.  He also had normal carotid ultrasounds.  Patient states he was doing well until the symptoms today. Past Medical History:  Diagnosis Date  . Cellulitis   . Dyslipidemia   . Hyperlipidemia 2013   LDL particle number [id number] with LDL CALULATED at 151 and 2300 in 1988 was the small LDL particle number,which is very high- not tinterested in taking medications  . Systolic murmur     Patient Active Problem List   Diagnosis Date Noted  . Left facial numbness 11/25/2018  . Hyperlipidemia 04/14/2013  . Mild aortic stenosis 04/14/2013    Past Surgical History:  Procedure Laterality Date  . CATARACT EXTRACTION    . DOPPLER ECHOCARDIOGRAPHY  12/21/2012   EF 55-60%,  . DOPPLER ECHOCARDIOGRAPHY  12/16/2011   MILD AORTIC STENOSIS,PEAK AND MEAN GRADIENTS OF 18 AND 8 mmHg and a valve area of around 2 square cm.         Home Medications    Prior to Admission medications   Medication Sig Start Date End Date Taking? Authorizing Provider  aspirin EC 81 MG EC tablet Take 1 tablet (81 mg total) by mouth daily. 11/26/18  Yes Rolly Salter, MD  hydrochlorothiazide (HYDRODIURIL) 25 MG tablet Take 1 tablet (25 mg total) by mouth daily. 11/26/18  Yes Rolly Salter, MD     Family History Family History  Problem Relation Age of Onset  . Diabetes Mother   . Heart failure Father     Social History Social History   Tobacco Use  . Smoking status: Former Smoker    Last attempt to quit: 04/15/1983    Years since quitting: 35.6  . Smokeless tobacco: Never Used  Substance Use Topics  . Alcohol use: Yes    Comment: beer occasionally  . Drug use: No     Allergies   Statins   Review of Systems Review of Systems  All other systems reviewed and are negative.    Physical Exam Updated Vital Signs BP (!) 155/90   Pulse 70   Temp 97.9 F (36.6 C) (Oral)   Resp 17   Ht 1.803 m (5\' 11" )   Wt 90.7 kg   SpO2 95%   BMI 27.89 kg/m   Physical Exam Vitals signs and nursing note reviewed.  Constitutional:      General: He is not in acute distress.    Appearance: He is well-developed.  HENT:     Head: Normocephalic and atraumatic.     Right Ear: External ear normal.     Left Ear: External ear normal.  Eyes:     General: No scleral icterus.  Right eye: No discharge.        Left eye: No discharge.     Conjunctiva/sclera: Conjunctivae normal.  Neck:     Musculoskeletal: Neck supple.     Trachea: No tracheal deviation.  Cardiovascular:     Rate and Rhythm: Normal rate and regular rhythm.  Pulmonary:     Effort: Pulmonary effort is normal. No respiratory distress.     Breath sounds: Normal breath sounds. No stridor. No wheezing or rales.  Abdominal:     General: Bowel sounds are normal. There is no distension.     Palpations: Abdomen is soft.     Tenderness: There is no abdominal tenderness. There is no guarding or rebound.  Musculoskeletal:        General: No tenderness.  Skin:    General: Skin is warm and dry.     Findings: No rash.  Neurological:     Mental Status: He is alert and oriented to person, place, and time.     Cranial Nerves: No cranial nerve deficit (No facial droop, extraocular movements intact, tongue midline ).      Sensory: No sensory deficit.     Motor: No abnormal muscle tone or seizure activity.     Coordination: Coordination normal.     Comments: No pronator drift bilateral upper extrem, able to hold both legs off bed for 5 seconds, sensation intact in all extremities, no visual field cuts, no left or right sided neglect, normal finger-nose exam bilaterally, no nystagmus noted       ED Treatments / Results  Labs (all labs ordered are listed, but only abnormal results are displayed) Labs Reviewed  COMPREHENSIVE METABOLIC PANEL - Abnormal; Notable for the following components:      Result Value   Sodium 134 (*)    Potassium 3.4 (*)    Glucose, Bld 115 (*)    All other components within normal limits  ETHANOL  PROTIME-INR  APTT  CBC  DIFFERENTIAL    EKG EKG Interpretation  Date/Time:  Wednesday December 08 2018 15:05:41 EDT Ventricular Rate:  82 PR Interval:    QRS Duration: 100 QT Interval:  402 QTC Calculation: 470 R Axis:   76 Text Interpretation:  Sinus rhythm No significant change since last tracing Confirmed by Linwood Dibbles [id number]) on 12/08/2018 3:16:30 PM   Radiology Ct Head Wo Contrast  Result Date: 12/08/2018 CLINICAL DATA:  Left-sided facial numbness EXAM: CT HEAD WITHOUT CONTRAST TECHNIQUE: Contiguous axial images were obtained from the base of the skull through the vertex without intravenous contrast. COMPARISON:  CT brain, 11/25/2018 FINDINGS: Brain: No evidence of acute infarction, hemorrhage, hydrocephalus, extra-axial collection or mass lesion/mass effect. Vascular: No hyperdense vessel or unexpected calcification. Skull: Normal. Negative for fracture or focal lesion. Sinuses/Orbits: No acute finding. Other: None. IMPRESSION: No acute intracranial pathology. Electronically Signed   By: Lauralyn Primes M.D.   On: 12/08/2018 16:51    Procedures Procedures (including critical care time)  Medications Ordered in ED Medications - No data to display   Initial Impression /  Assessment and Plan / ED Course  I have reviewed the triage vital signs and the nursing notes.  Pertinent labs & imaging results that were available during my care of the patient were reviewed by me and considered in my medical decision making (see chart for details).   Pt presents for recurrent facial numbness.    Previous admission with extensive workup including carotid Dopplers, MRI and MRA.  No focal deficits on exam.  Complains of numbness but sensation is intact.  Pt is feeling better at this time.  Doubt stroke, tia.  Discussed close follow up with PCP and consider neurology follow up.  Final Clinical Impressions(s) / ED Diagnoses   Final diagnoses:  Paresthesia    ED Discharge Orders    None       Linwood Dibbles, MD 12/08/18 1739

## 2018-12-08 NOTE — Discharge Instructions (Addendum)
Follow up with your primary care doctor, consider seeing a neurologist, Dr Gerilyn Pilgrim for further evaluation

## 2018-12-08 NOTE — ED Triage Notes (Signed)
Numbness to lt side of face and lt side of back of head started approx ago. Seen here recently and admitted for same symptoms dx with tia. Pt has no other s/s at this time.   cbg 111.  Pt is a&o x 4 at this time and denies any pain.

## 2018-12-08 NOTE — ED Notes (Signed)
EDP at the bedside.  ?

## 2018-12-15 DIAGNOSIS — Z1389 Encounter for screening for other disorder: Secondary | ICD-10-CM | POA: Diagnosis not present

## 2018-12-15 DIAGNOSIS — E7849 Other hyperlipidemia: Secondary | ICD-10-CM | POA: Diagnosis not present

## 2018-12-15 DIAGNOSIS — E785 Hyperlipidemia, unspecified: Secondary | ICD-10-CM | POA: Diagnosis not present

## 2018-12-15 DIAGNOSIS — Z125 Encounter for screening for malignant neoplasm of prostate: Secondary | ICD-10-CM | POA: Diagnosis not present

## 2018-12-15 DIAGNOSIS — Z6828 Body mass index (BMI) 28.0-28.9, adult: Secondary | ICD-10-CM | POA: Diagnosis not present

## 2018-12-15 DIAGNOSIS — Z23 Encounter for immunization: Secondary | ICD-10-CM | POA: Diagnosis not present

## 2018-12-15 DIAGNOSIS — R7309 Other abnormal glucose: Secondary | ICD-10-CM | POA: Diagnosis not present

## 2018-12-15 DIAGNOSIS — Z79899 Other long term (current) drug therapy: Secondary | ICD-10-CM | POA: Diagnosis not present

## 2018-12-15 DIAGNOSIS — Z0001 Encounter for general adult medical examination with abnormal findings: Secondary | ICD-10-CM | POA: Diagnosis not present

## 2018-12-15 DIAGNOSIS — R5383 Other fatigue: Secondary | ICD-10-CM | POA: Diagnosis not present

## 2018-12-15 DIAGNOSIS — E663 Overweight: Secondary | ICD-10-CM | POA: Diagnosis not present

## 2019-02-03 DIAGNOSIS — Z1389 Encounter for screening for other disorder: Secondary | ICD-10-CM | POA: Diagnosis not present

## 2019-02-03 DIAGNOSIS — Z6827 Body mass index (BMI) 27.0-27.9, adult: Secondary | ICD-10-CM | POA: Diagnosis not present

## 2019-02-03 DIAGNOSIS — E7849 Other hyperlipidemia: Secondary | ICD-10-CM | POA: Diagnosis not present

## 2019-02-03 DIAGNOSIS — T466X5D Adverse effect of antihyperlipidemic and antiarteriosclerotic drugs, subsequent encounter: Secondary | ICD-10-CM | POA: Diagnosis not present

## 2019-02-03 DIAGNOSIS — E663 Overweight: Secondary | ICD-10-CM | POA: Diagnosis not present

## 2019-05-09 DIAGNOSIS — Z6841 Body Mass Index (BMI) 40.0 and over, adult: Secondary | ICD-10-CM | POA: Diagnosis not present

## 2019-05-09 DIAGNOSIS — E7849 Other hyperlipidemia: Secondary | ICD-10-CM | POA: Diagnosis not present

## 2019-05-09 DIAGNOSIS — Z888 Allergy status to other drugs, medicaments and biological substances status: Secondary | ICD-10-CM | POA: Diagnosis not present

## 2019-05-09 DIAGNOSIS — T466X5D Adverse effect of antihyperlipidemic and antiarteriosclerotic drugs, subsequent encounter: Secondary | ICD-10-CM | POA: Diagnosis not present

## 2019-11-10 IMAGING — CT CT HEAD WITHOUT CONTRAST
3 series · 16 of 47 positions shown, 19 images · non-contrast
Comparison: CT brain, 11/25/2018

CLINICAL DATA: Left-sided facial numbness

EXAM:
CT HEAD WITHOUT CONTRAST
TECHNIQUE: Contiguous axial images were obtained from the base of the skull
through the vertex without intravenous contrast.

[Series 2: head trauma wo · axial · 0.43mm/px · z∈[+55,+185]mm · 10 of 32 slices shown, 13 images]
[im 3/32  brain]
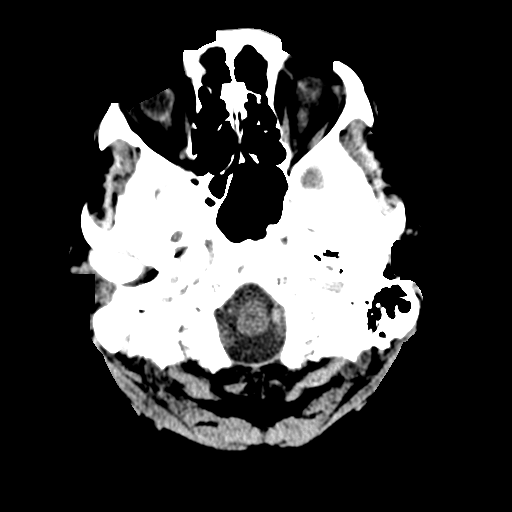
[im 3/32  bone]
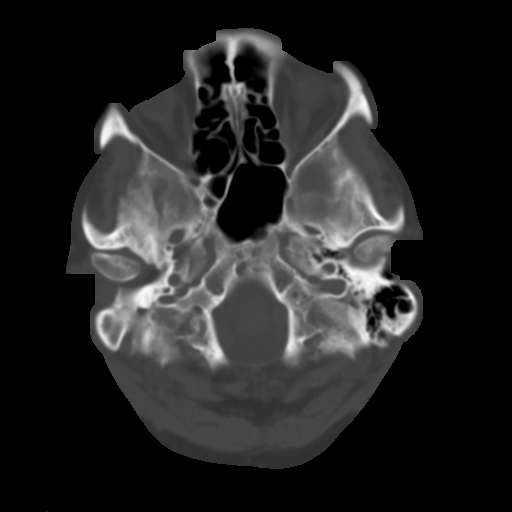
[im 6/32  brain]
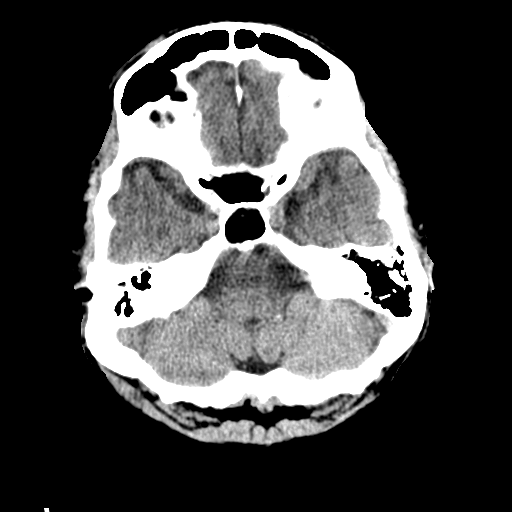
[im 9/32  brain]
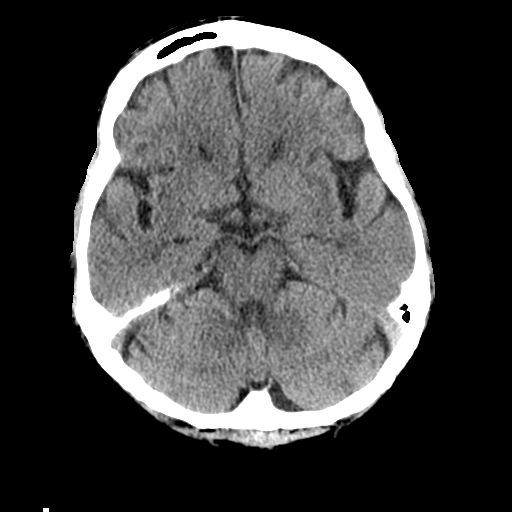
[im 11/32  brain]
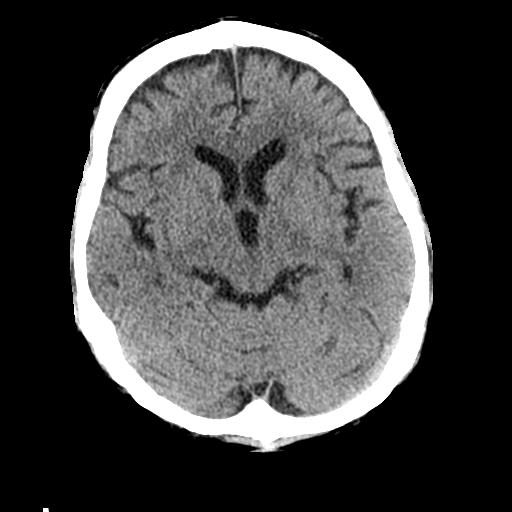
[im 14/32  brain]
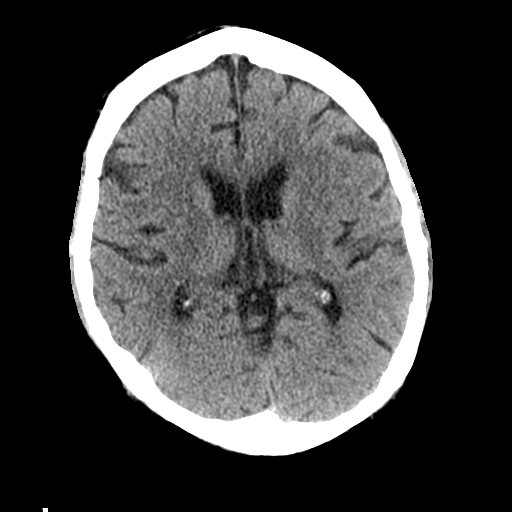
[im 14/32  bone]
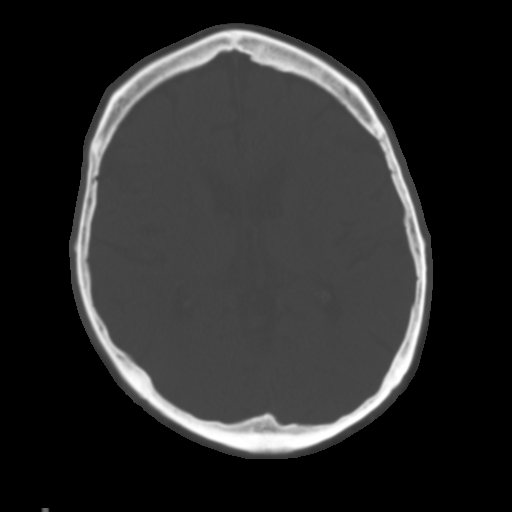
[im 18/32  brain]
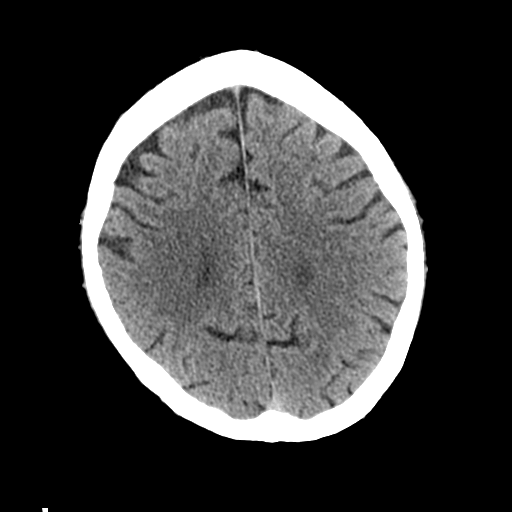
[im 21/32  brain]
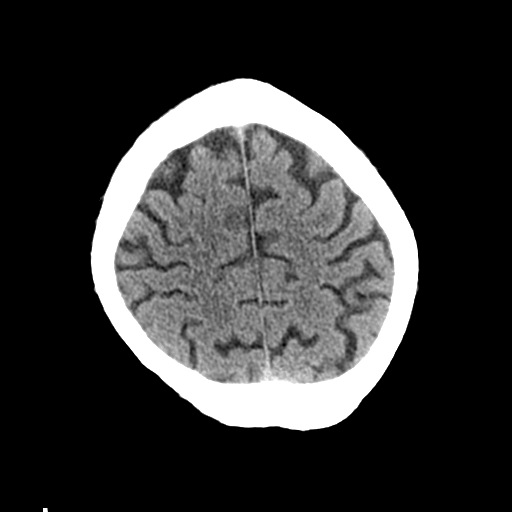
[im 24/32  brain]
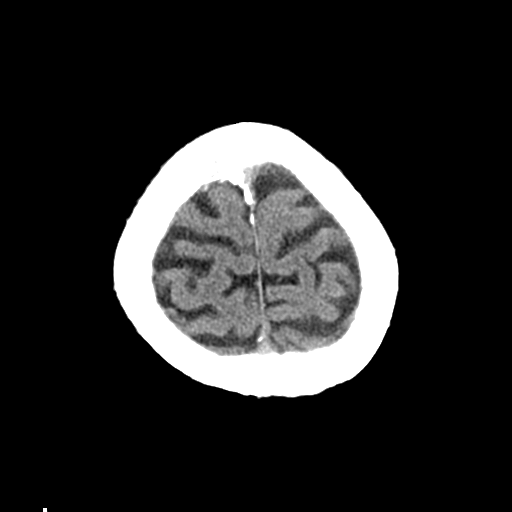
[im 26/32  brain]
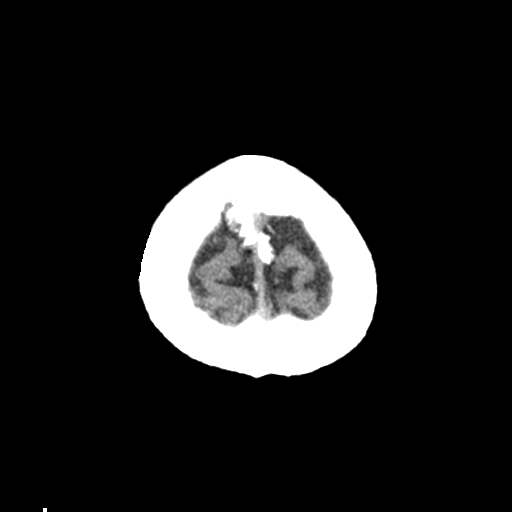
[im 26/32  bone]
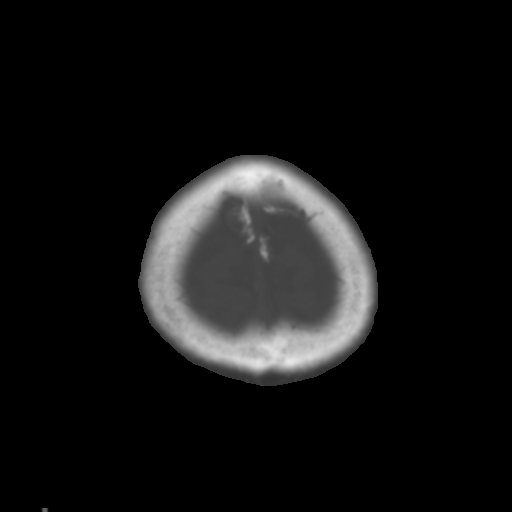
[im 29/32  brain]
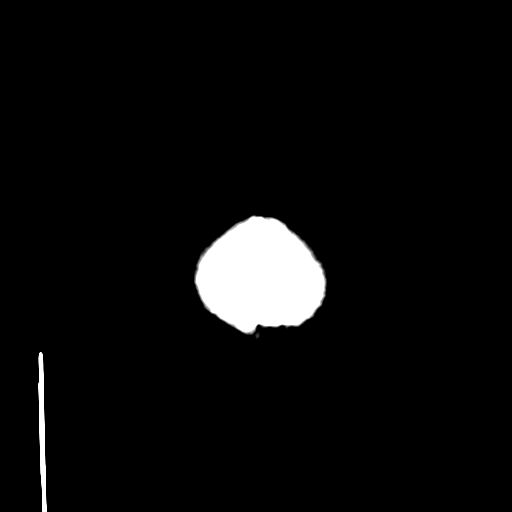

[Series 3: coronal soft tissue · coronal · 0.33mm/px · 3 of 65 slices shown]
[im 22/65  brain]
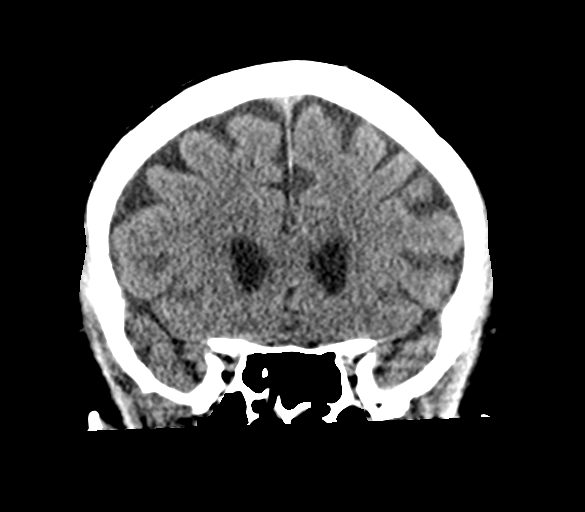
[im 29/65  brain]
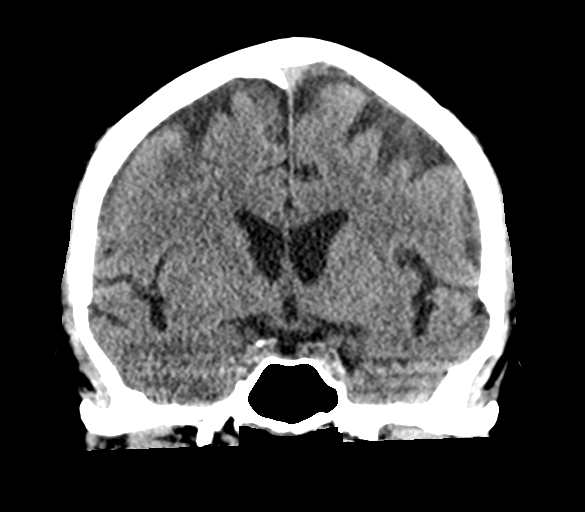
[im 36/65  brain]
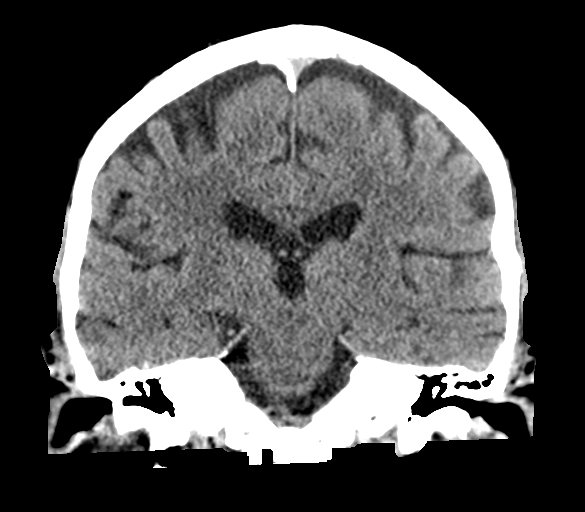

[Series 5: sagittal soft tissue · sagittal · 0.33mm/px · 3 of 65 slices shown]
[im 22/65  brain]
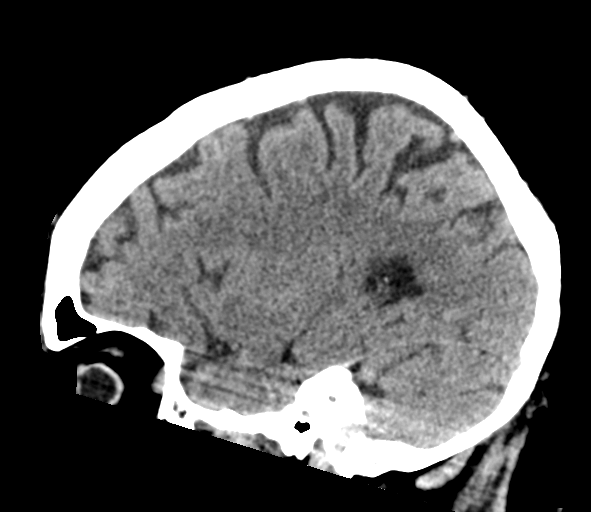
[im 33/65  brain]
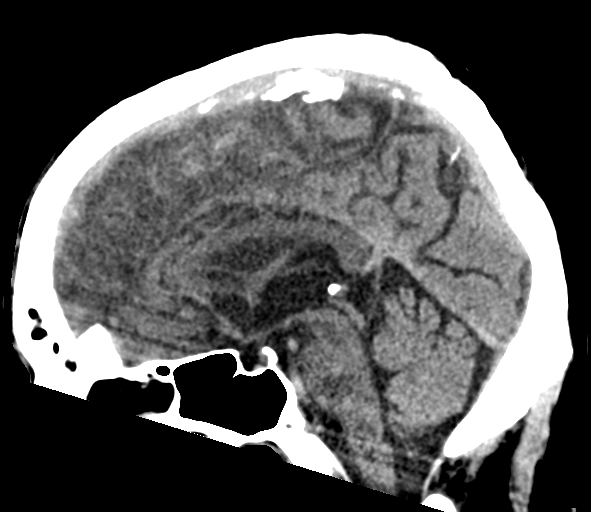
[im 43/65  brain]
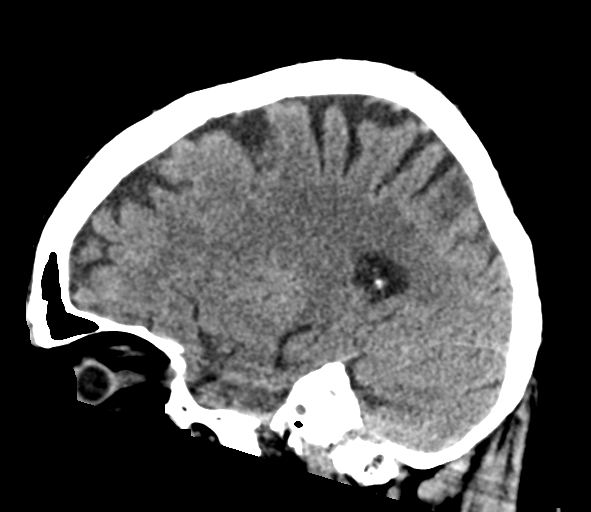

[16 of 47 positions shown; findings below may reference images not displayed]

FINDINGS: Brain: No evidence of acute infarction, hemorrhage, hydrocephalus,
extra-axial collection or mass lesion/mass effect.

Vascular: No hyperdense vessel or unexpected calcification.

Skull: Normal. Negative for fracture or focal lesion.

Sinuses/Orbits: No acute finding.

Other: None.
IMPRESSION: No acute intracranial pathology.

## 2020-02-24 DIAGNOSIS — R233 Spontaneous ecchymoses: Secondary | ICD-10-CM | POA: Diagnosis not present

## 2020-02-24 DIAGNOSIS — B387 Disseminated coccidioidomycosis: Secondary | ICD-10-CM | POA: Diagnosis not present

## 2020-02-24 DIAGNOSIS — R201 Hypoesthesia of skin: Secondary | ICD-10-CM | POA: Diagnosis not present

## 2020-02-24 DIAGNOSIS — R21 Rash and other nonspecific skin eruption: Secondary | ICD-10-CM | POA: Diagnosis not present

## 2020-02-24 DIAGNOSIS — W57XXXA Bitten or stung by nonvenomous insect and other nonvenomous arthropods, initial encounter: Secondary | ICD-10-CM | POA: Diagnosis not present

## 2020-02-24 DIAGNOSIS — Z2089 Contact with and (suspected) exposure to other communicable diseases: Secondary | ICD-10-CM | POA: Diagnosis not present

## 2021-04-19 ENCOUNTER — Ambulatory Visit
Admission: EM | Admit: 2021-04-19 | Discharge: 2021-04-19 | Disposition: A | Discharge status: Home / Self Care | Payer: Medicare Other | Attending: Internal Medicine | Admitting: Internal Medicine

## 2021-04-19 ENCOUNTER — Encounter: Payer: Self-pay | Admitting: *Deleted

## 2021-04-19 ENCOUNTER — Other Ambulatory Visit: Payer: Self-pay

## 2021-04-19 DIAGNOSIS — R5383 Other fatigue: Secondary | ICD-10-CM

## 2021-04-19 DIAGNOSIS — R6889 Other general symptoms and signs: Secondary | ICD-10-CM

## 2021-04-19 MED ORDER — PREDNISONE 20 MG PO TABS: 20.0000 mg | ORAL_TABLET | Freq: Two times a day (BID) | ORAL | 0 refills | Status: DC

## 2021-04-19 MED ORDER — PREDNISONE 20 MG PO TABS: 20.0000 mg | ORAL_TABLET | Freq: Two times a day (BID) | ORAL | 0 refills | Status: AC

## 2021-04-19 NOTE — ED Triage Notes (Signed)
PT also reports he did not eat this morning

## 2021-04-19 NOTE — ED Notes (Signed)
CBG: 113 °

## 2021-04-19 NOTE — ED Triage Notes (Signed)
Pt reports Sx's started this AM . Pt reports he feels like he has the FLU. Pt mowed his yard yesterday and after he went into the air condition house . Pt reported he did not feel like eating last night.

## 2021-04-19 NOTE — ED Provider Notes (Signed)
[facility]   [id number] 04/19/21 Arrival Time: 1157   CC: COVID symptoms  SUBJECTIVE: History from: patient.  Jonathan Romero is a 75 y.o. male who presents with fatigue, congestion, sore throat, and slight cough that began this morning.  Denies sick exposure to COVID, flu or strep.  Denies alleviating or aggravating factors.  Denies previous covid infection in the past.   Denies fever, chills, SOB, wheezing, chest pain, nausea, changes in bowel or bladder habits.     ROS: As per HPI.  All other pertinent ROS negative.     Past Medical History:  Diagnosis Date   Cellulitis    Dyslipidemia    Hyperlipidemia 2013   LDL particle number [id number] with LDL CALULATED at 151 and 2300 in 1988 was the small LDL particle number,which is very high- not tinterested in taking medications   Systolic murmur    Past Surgical History:  Procedure Laterality Date   CATARACT EXTRACTION     DOPPLER ECHOCARDIOGRAPHY  12/21/2012   EF 55-60%,   DOPPLER ECHOCARDIOGRAPHY  12/16/2011   MILD AORTIC STENOSIS,PEAK AND MEAN GRADIENTS OF 18 AND 8 mmHg and a valve area of around 2 square cm.    Allergies  Allergen Reactions   Statins    No current facility-administered medications on file prior to encounter.   Current Outpatient Medications on File Prior to Encounter  Medication Sig Dispense Refill   aspirin EC 81 MG EC tablet Take 1 tablet (81 mg total) by mouth daily. 60 tablet 0   hydrochlorothiazide (HYDRODIURIL) 25 MG tablet Take 1 tablet (25 mg total) by mouth daily. 30 tablet 0   Social History   Socioeconomic History   Marital status: Married    Spouse name: Not on file   Number of children: Not on file   Years of education: Not on file   Highest education level: Not on file  Occupational History   Not on file  Tobacco Use   Smoking status: Former    Types: Cigarettes    Quit date: 04/15/1983    Years since quitting: 38.0   Smokeless tobacco: Never  Substance and Sexual  Activity   Alcohol use: Yes    Comment: beer occasionally   Drug use: No   Sexual activity: Not on file  Other Topics Concern   Not on file  Social History Narrative   Not on file   Social Determinants of Health   Financial Resource Strain: Not on file  Food Insecurity: Not on file  Transportation Needs: Not on file  Physical Activity: Not on file  Stress: Not on file  Social Connections: Not on file  Intimate Partner Violence: Not on file   Family History  Problem Relation Age of Onset   Diabetes Mother    Heart failure Father     OBJECTIVE:  Vitals:   04/19/21 1210  BP: 116/62  Pulse: 83  Resp: 18  Temp: 99.5 F (37.5 C)  SpO2: 95%     General appearance: alert; appears fatigued, but nontoxic; speaking in full sentences and tolerating own secretions HEENT: NCAT; Ears: EACs clear, TMs pearly gray; Eyes: PERRL.  EOM grossly intact. Nose: nares patent without rhinorrhea, Throat: oropharynx clear, tonsils non erythematous or enlarged, uvula midline  Neck: supple without LAD Lungs: unlabored respirations, symmetrical air entry; cough: absent; no respiratory distress; CTAB Heart: systolic murmur present Skin: warm and dry Psychological: alert and cooperative; normal mood and affect  ASSESSMENT & PLAN:  1. Other fatigue  2. Flu-like symptoms     Meds ordered this encounter  Medications   DISCONTD: predniSONE (DELTASONE) 20 MG tablet    Sig: Take 1 tablet (20 mg total) by mouth 2 (two) times daily with a meal for 5 days.    Dispense:  10 tablet    Refill:  0    Order Specific Question:   Supervising Provider    Answer:   Eustace Moore [[id number]]   predniSONE (DELTASONE) 20 MG tablet    Sig: Take 1 tablet (20 mg total) by mouth 2 (two) times daily with a meal for 5 days.    Dispense:  10 tablet    Refill:  0    Order Specific Question:   Supervising Provider    Answer:   Eustace Moore [[id number]]    COVID testing ordered.  It will take between 5-7  days for test results.  Someone will contact you regarding abnormal results.    In the meantime: You should remain isolated in your home for 5 days from symptom onset AND greater than 72 hours after symptoms resolution (absence of fever without the use of fever-reducing medication and improvement in respiratory symptoms), whichever is longer Get plenty of rest and push fluids Use OTC zyrtec for nasal congestion, runny nose, and/or sore throat Use OTC flonase for nasal congestion and runny nose Use medications daily for symptom relief Use OTC medications like ibuprofen or tylenol as needed fever or pain Call or go to the ED if you have any new or worsening symptoms such as fever, worsening cough, shortness of breath, chest tightness, chest pain, turning blue, changes in mental status, etc...   Reviewed expectations re: course of current medical issues. Questions answered. Outlined signs and symptoms indicating need for more acute intervention. Patient verbalized understanding. After Visit Summary given.          Rennis Harding, PA-C 04/19/21 1246

## 2021-04-19 NOTE — Discharge Instructions (Addendum)
COVID testing ordered.  It will take between 5-7 days for test results.  Someone will contact you regarding abnormal results.    In the meantime: You should remain isolated in your home for 5 days from symptom onset AND greater than 72 hours after symptoms resolution (absence of fever without the use of fever-reducing medication and improvement in respiratory symptoms), whichever is longer Get plenty of rest and push fluids Use OTC zyrtec for nasal congestion, runny nose, and/or sore throat Use OTC flonase for nasal congestion and runny nose Use medications daily for symptom relief Use OTC medications like ibuprofen or tylenol as needed fever or pain Call or go to the ED if you have any new or worsening symptoms such as fever, worsening cough, shortness of breath, chest tightness, chest pain, turning blue, changes in mental status, etc...  

## 2021-04-20 LAB — COVID-19, FLU A+B NAA
Influenza A, NAA: NOT DETECTED
Influenza B, NAA: NOT DETECTED
SARS-CoV-2, NAA: DETECTED — AB

## 2021-04-22 DIAGNOSIS — J40 Bronchitis, not specified as acute or chronic: Secondary | ICD-10-CM | POA: Diagnosis not present

## 2021-04-22 DIAGNOSIS — B349 Viral infection, unspecified: Secondary | ICD-10-CM | POA: Diagnosis not present

## 2021-04-22 DIAGNOSIS — J029 Acute pharyngitis, unspecified: Secondary | ICD-10-CM | POA: Diagnosis not present

## 2021-04-22 DIAGNOSIS — U071 COVID-19: Secondary | ICD-10-CM | POA: Diagnosis not present

## 2021-05-08 DIAGNOSIS — U071 COVID-19: Secondary | ICD-10-CM | POA: Diagnosis not present

## 2021-05-08 DIAGNOSIS — J189 Pneumonia, unspecified organism: Secondary | ICD-10-CM | POA: Diagnosis not present

## 2021-05-09 DIAGNOSIS — U071 COVID-19: Secondary | ICD-10-CM | POA: Diagnosis not present

## 2021-05-09 DIAGNOSIS — J189 Pneumonia, unspecified organism: Secondary | ICD-10-CM | POA: Diagnosis not present

## 2021-05-10 DIAGNOSIS — J189 Pneumonia, unspecified organism: Secondary | ICD-10-CM | POA: Diagnosis not present

## 2021-05-10 DIAGNOSIS — U071 COVID-19: Secondary | ICD-10-CM | POA: Diagnosis not present

## 2021-06-11 DIAGNOSIS — J189 Pneumonia, unspecified organism: Secondary | ICD-10-CM | POA: Diagnosis not present

## 2021-06-11 DIAGNOSIS — U071 COVID-19: Secondary | ICD-10-CM | POA: Diagnosis not present

## 2022-08-09 ENCOUNTER — Inpatient Hospital Stay (HOSPITAL_COMMUNITY)
Admission: EM | Admit: 2022-08-09 | Discharge: 2022-08-14 | DRG: 321 | Disposition: A | Discharge status: Home / Self Care | Payer: Medicare Other | Attending: Cardiology | Admitting: Cardiology

## 2022-08-09 ENCOUNTER — Emergency Department (HOSPITAL_COMMUNITY): Payer: Medicare Other

## 2022-08-09 DIAGNOSIS — E785 Hyperlipidemia, unspecified: Secondary | ICD-10-CM | POA: Diagnosis present

## 2022-08-09 DIAGNOSIS — R0902 Hypoxemia: Secondary | ICD-10-CM | POA: Diagnosis not present

## 2022-08-09 DIAGNOSIS — R778 Other specified abnormalities of plasma proteins: Secondary | ICD-10-CM | POA: Diagnosis not present

## 2022-08-09 DIAGNOSIS — W19XXXA Unspecified fall, initial encounter: Secondary | ICD-10-CM | POA: Diagnosis present

## 2022-08-09 DIAGNOSIS — Z833 Family history of diabetes mellitus: Secondary | ICD-10-CM

## 2022-08-09 DIAGNOSIS — Z7982 Long term (current) use of aspirin: Secondary | ICD-10-CM

## 2022-08-09 DIAGNOSIS — I213 ST elevation (STEMI) myocardial infarction of unspecified site: Secondary | ICD-10-CM | POA: Diagnosis not present

## 2022-08-09 DIAGNOSIS — J9811 Atelectasis: Secondary | ICD-10-CM | POA: Diagnosis not present

## 2022-08-09 DIAGNOSIS — Z9849 Cataract extraction status, unspecified eye: Secondary | ICD-10-CM

## 2022-08-09 DIAGNOSIS — R7989 Other specified abnormal findings of blood chemistry: Secondary | ICD-10-CM

## 2022-08-09 DIAGNOSIS — Y9301 Activity, walking, marching and hiking: Secondary | ICD-10-CM | POA: Diagnosis present

## 2022-08-09 DIAGNOSIS — R0689 Other abnormalities of breathing: Secondary | ICD-10-CM | POA: Diagnosis not present

## 2022-08-09 DIAGNOSIS — Z01810 Encounter for preprocedural cardiovascular examination: Secondary | ICD-10-CM | POA: Diagnosis not present

## 2022-08-09 DIAGNOSIS — I2510 Atherosclerotic heart disease of native coronary artery without angina pectoris: Secondary | ICD-10-CM | POA: Diagnosis not present

## 2022-08-09 DIAGNOSIS — I255 Ischemic cardiomyopathy: Secondary | ICD-10-CM | POA: Diagnosis present

## 2022-08-09 DIAGNOSIS — I5042 Chronic combined systolic (congestive) and diastolic (congestive) heart failure: Secondary | ICD-10-CM | POA: Diagnosis present

## 2022-08-09 DIAGNOSIS — I471 Supraventricular tachycardia, unspecified: Secondary | ICD-10-CM | POA: Diagnosis present

## 2022-08-09 DIAGNOSIS — R079 Chest pain, unspecified: Secondary | ICD-10-CM | POA: Diagnosis not present

## 2022-08-09 DIAGNOSIS — I272 Pulmonary hypertension, unspecified: Secondary | ICD-10-CM | POA: Diagnosis not present

## 2022-08-09 DIAGNOSIS — Z87891 Personal history of nicotine dependence: Secondary | ICD-10-CM | POA: Diagnosis not present

## 2022-08-09 DIAGNOSIS — Z888 Allergy status to other drugs, medicaments and biological substances status: Secondary | ICD-10-CM

## 2022-08-09 DIAGNOSIS — Z8249 Family history of ischemic heart disease and other diseases of the circulatory system: Secondary | ICD-10-CM | POA: Diagnosis not present

## 2022-08-09 DIAGNOSIS — I4891 Unspecified atrial fibrillation: Secondary | ICD-10-CM | POA: Insufficient documentation

## 2022-08-09 DIAGNOSIS — R002 Palpitations: Secondary | ICD-10-CM

## 2022-08-09 DIAGNOSIS — R0789 Other chest pain: Secondary | ICD-10-CM | POA: Diagnosis not present

## 2022-08-09 DIAGNOSIS — Y92099 Unspecified place in other non-institutional residence as the place of occurrence of the external cause: Secondary | ICD-10-CM

## 2022-08-09 DIAGNOSIS — J90 Pleural effusion, not elsewhere classified: Secondary | ICD-10-CM | POA: Diagnosis not present

## 2022-08-09 DIAGNOSIS — I700 Atherosclerosis of aorta: Secondary | ICD-10-CM | POA: Diagnosis not present

## 2022-08-09 DIAGNOSIS — I214 Non-ST elevation (NSTEMI) myocardial infarction: Principal | ICD-10-CM | POA: Diagnosis present

## 2022-08-09 DIAGNOSIS — D696 Thrombocytopenia, unspecified: Secondary | ICD-10-CM | POA: Diagnosis not present

## 2022-08-09 DIAGNOSIS — I25118 Atherosclerotic heart disease of native coronary artery with other forms of angina pectoris: Secondary | ICD-10-CM | POA: Diagnosis not present

## 2022-08-09 DIAGNOSIS — I350 Nonrheumatic aortic (valve) stenosis: Secondary | ICD-10-CM | POA: Diagnosis not present

## 2022-08-09 DIAGNOSIS — Z79899 Other long term (current) drug therapy: Secondary | ICD-10-CM

## 2022-08-09 DIAGNOSIS — D649 Anemia, unspecified: Secondary | ICD-10-CM | POA: Diagnosis present

## 2022-08-09 DIAGNOSIS — I080 Rheumatic disorders of both mitral and aortic valves: Secondary | ICD-10-CM | POA: Diagnosis not present

## 2022-08-09 DIAGNOSIS — R55 Syncope and collapse: Secondary | ICD-10-CM

## 2022-08-09 DIAGNOSIS — I4819 Other persistent atrial fibrillation: Secondary | ICD-10-CM | POA: Insufficient documentation

## 2022-08-09 LAB — COMPREHENSIVE METABOLIC PANEL
ALT: 19 U/L (ref 0–44)
AST: 40 U/L (ref 15–41)
Albumin: 3 g/dL — ABNORMAL LOW (ref 3.5–5.0)
Alkaline Phosphatase: 75 U/L (ref 38–126)
Anion gap: 11 (ref 5–15)
BUN: 25 mg/dL — ABNORMAL HIGH (ref 8–23)
CO2: 22 mmol/L (ref 22–32)
Calcium: 8.5 mg/dL — ABNORMAL LOW (ref 8.9–10.3)
Chloride: 106 mmol/L (ref 98–111)
Creatinine, Ser: 1.12 mg/dL (ref 0.61–1.24)
GFR, Estimated: >60 mL/min (ref >60)
Glucose, Bld: 148 mg/dL — ABNORMAL HIGH (ref 70–99)
Potassium: 4.6 mmol/L (ref 3.5–5.1)
Sodium: 139 mmol/L (ref 135–145)
Total Bilirubin: 1.6 mg/dL — ABNORMAL HIGH (ref 0.3–1.2)
Total Protein: 5.7 g/dL — ABNORMAL LOW (ref 6.5–8.1)

## 2022-08-09 LAB — T4, FREE: Free T4: 0.95 ng/dL (ref 0.61–1.12)

## 2022-08-09 LAB — CBC WITH DIFFERENTIAL/PLATELET
Abs Immature Granulocytes: 0.02 10*3/uL (ref 0.00–0.07)
Basophils Absolute: 0.1 10*3/uL (ref 0.0–0.1)
Basophils Relative: 1 %
Eosinophils Absolute: 0.1 10*3/uL (ref 0.0–0.5)
Eosinophils Relative: 1 %
HCT: 34.9 % — ABNORMAL LOW (ref 39.0–52.0)
Hemoglobin: 12 g/dL — ABNORMAL LOW (ref 13.0–17.0)
Immature Granulocytes: 0 %
Lymphocytes Relative: 23 %
Lymphs Abs: 1.5 10*3/uL (ref 0.7–4.0)
MCH: 34.5 pg — ABNORMAL HIGH (ref 26.0–34.0)
MCHC: 34.4 g/dL (ref 30.0–36.0)
MCV: 100.3 fL — ABNORMAL HIGH (ref 80.0–100.0)
Monocytes Absolute: 0.4 10*3/uL (ref 0.1–1.0)
Monocytes Relative: 5 %
Neutro Abs: 4.6 10*3/uL (ref 1.7–7.7)
Neutrophils Relative %: 70 %
Platelets: 149 10*3/uL — ABNORMAL LOW (ref 150–400)
RBC: 3.48 MIL/uL — ABNORMAL LOW (ref 4.22–5.81)
RDW: 13.2 % (ref 11.5–15.5)
WBC: 6.6 10*3/uL (ref 4.0–10.5)
nRBC: 0 % (ref 0.0–0.2)

## 2022-08-09 LAB — PROTIME-INR
INR: 1.1 (ref 0.8–1.2)
Prothrombin Time: 13.7 seconds (ref 11.4–15.2)

## 2022-08-09 LAB — TROPONIN I (HIGH SENSITIVITY)
Troponin I (High Sensitivity): 2108 ng/L (ref <18)
Troponin I (High Sensitivity): 2044 ng/L (ref <18)

## 2022-08-09 LAB — TSH: TSH: 2.157 u[IU]/mL (ref 0.350–4.500)

## 2022-08-09 LAB — MAGNESIUM: Magnesium: 2 mg/dL (ref 1.7–2.4)

## 2022-08-09 LAB — CBG MONITORING, ED: Glucose-Capillary: 154 mg/dL — ABNORMAL HIGH (ref 70–99)

## 2022-08-09 LAB — APTT: aPTT: 28 seconds (ref 24–36)

## 2022-08-09 LAB — BRAIN NATRIURETIC PEPTIDE: B Natriuretic Peptide: 486.3 pg/mL — ABNORMAL HIGH (ref 0.0–100.0)

## 2022-08-09 MED ORDER — ASPIRIN 81 MG PO CHEW: 324.0000 mg | CHEWABLE_TABLET | Freq: Once | ORAL | Status: DC

## 2022-08-09 MED ORDER — NITROGLYCERIN 0.4 MG SL SUBL: 0.4000 mg | SUBLINGUAL_TABLET | SUBLINGUAL | Status: DC | PRN

## 2022-08-09 MED ORDER — ASPIRIN 81 MG PO TBEC
81.0000 mg | DELAYED_RELEASE_TABLET | Freq: Every day | ORAL | Status: DC
  Administered 2022-08-10 – 2022-08-14 (×5): 81 mg via ORAL
  Filled 2022-08-09 (×5): qty 1

## 2022-08-09 MED ORDER — HEPARIN BOLUS VIA INFUSION
4000.0000 [IU] | Freq: Once | INTRAVENOUS | Status: AC
  Administered 2022-08-09: 4000 [IU] via INTRAVENOUS
  Filled 2022-08-09: qty 4000

## 2022-08-09 MED ORDER — ONDANSETRON HCL 4 MG/2ML IJ SOLN: 4.0000 mg | Freq: Four times a day (QID) | INTRAMUSCULAR | Status: DC | PRN

## 2022-08-09 MED ORDER — HEPARIN (PORCINE) 25000 UT/250ML-% IV SOLN
1150.0000 [IU]/h | INTRAVENOUS | Status: DC
  Administered 2022-08-09: 850 [IU]/h via INTRAVENOUS
  Administered 2022-08-10: 1150 [IU]/h via INTRAVENOUS
  Filled 2022-08-09 (×2): qty 250

## 2022-08-09 MED ORDER — ACETAMINOPHEN 325 MG PO TABS: 650.0000 mg | ORAL_TABLET | ORAL | Status: DC | PRN

## 2022-08-09 NOTE — H&P (Incomplete)
Cardiology Admission History and Physical:   Patient ID: SHYLER HOLZMAN MRN: [id number]; DOB: 12-Oct-1945   Admission date: 08/09/2022  Primary Care Provider: Assunta Found, MD [facility] Cardiologist: None *** [facility] Electrophysiologist:  None ***  Chief Complaint:  ***  Patient Profile:   Jonathan Romero is a 76 y.o. male with ***  History of Present Illness:   Mr. Tilghman ***  Heart burn and took pepto this morning (11/18) He was coming back from feeding cats and would get short winded Passed out and fell on the floor  L arm numbness and heart burn  Back was clammy/sweaty   No severe pain   Carrying in 30 lb boxes of chewy cat food Wife thought it looked like a seizure   HR 97 100% RA RR 17 126/87 (100)  Wife had tried to call EMS this morning but hung up because patient refused to come to hospital   Mild tightness on L side, felt more like heart burn than chest pain  No nausea this morning   This evening he had associated "acid reflux" and that he was going to vomit, did have a small amount of emesis   Short winded/dizziness this evening with more short winded, lasted until EMS came and gave him some shots  Stopped shortly after pain meds   Distant prior smoker, 40 years ago  ~30-40 py   Not on any medications   No prior heart caths/surgery   His dad had "enlarged heart"  Mother had "minor heart problems" and was diabetic            Past Medical History:  Diagnosis Date   Cellulitis    Dyslipidemia    Hyperlipidemia 2013   LDL particle number 3135 with LDL CALULATED at 151 and 2300 in 1988 was the small LDL particle number,which is very high- not tinterested in taking medications   Systolic murmur     Past Surgical History:  Procedure Laterality Date   CATARACT EXTRACTION     DOPPLER ECHOCARDIOGRAPHY  12/21/2012   EF 55-60%,   DOPPLER ECHOCARDIOGRAPHY  12/16/2011   MILD AORTIC STENOSIS,PEAK AND MEAN GRADIENTS OF 18 AND 8  mmHg and a valve area of around 2 square cm.      Medications Prior to Admission: Prior to Admission medications   Medication Sig Start Date End Date Taking? Authorizing Provider  aspirin EC 325 MG tablet Take 325 mg by mouth once.   Yes [provider]  aspirin EC 81 MG EC tablet Take 1 tablet (81 mg total) by mouth daily. Patient not taking: Reported on 08/09/2022 11/26/18   Rolly Salter, MD  hydrochlorothiazide (HYDRODIURIL) 25 MG tablet Take 1 tablet (25 mg total) by mouth daily. Patient not taking: Reported on 08/09/2022 11/26/18   Rolly Salter, MD     Allergies:    Allergies  Allergen Reactions   Statins     Social History:   Social History   Socioeconomic History   Marital status: Married    Spouse name: Not on file   Number of children: Not on file   Years of education: Not on file   Highest education level: Not on file  Occupational History   Not on file  Tobacco Use   Smoking status: Former    Types: Cigarettes    Quit date: 04/15/1983    Years since quitting: 39.3   Smokeless tobacco: Never  Substance and Sexual Activity   Alcohol use: Yes  Comment: beer occasionally   Drug use: No   Sexual activity: Not on file  Other Topics Concern   Not on file  Social History Narrative   Not on file   Social Determinants of Health   Financial Resource Strain: Not on file  Food Insecurity: Not on file  Transportation Needs: Not on file  Physical Activity: Not on file  Stress: Not on file  Social Connections: Not on file  Intimate Partner Violence: Not on file    Family History:  *** The patient's family history includes Diabetes in his mother; Heart failure in his father.    ROS:   Review of Systems: [y] = yes, [ ]  = no      General: Weight gain [ ] ; Weight loss [ ] ; Anorexia [ ] ; Fatigue [ ] ; Fever [ ] ; Chills [ ] ; Weakness [ ]    Cardiac: Chest pain/pressure [ ] ; Resting SOB [ ] ; Exertional SOB [ ] ; Orthopnea [ ] ; Pedal Edema [ ] ; Palpitations  [ ] ; Syncope [ ] ; Presyncope [ ] ; Paroxysmal nocturnal dyspnea [ ]    Pulmonary: Cough [ ] ; Wheezing [ ] ; Hemoptysis [ ] ; Sputum [ ] ; Snoring [ ]    GI: Vomiting [ ] ; Dysphagia [ ] ; Melena [ ] ; Hematochezia [ ] ; Heartburn [ ] ; Abdominal pain [ ] ; Constipation [ ] ; Diarrhea [ ] ; BRBPR [ ]    GU: Hematuria [ ] ; Dysuria [ ] ; Nocturia [ ]  Vascular: Pain in legs with walking [ ] ; Pain in feet with lying flat [ ] ; Non-healing sores [ ] ; Stroke [ ] ; TIA [ ] ; Slurred speech [ ] ;   Neuro: Headaches [ ] ; Vertigo [ ] ; Seizures [ ] ; Paresthesias [ ] ;Blurred vision [ ] ; Diplopia [ ] ; Vision changes [ ]    Ortho/Skin: Arthritis [ ] ; Joint pain [ ] ; Muscle pain [ ] ; Joint swelling [ ] ; Back Pain [ ] ; Rash [ ]    Psych: Depression [ ] ; Anxiety [ ]    Heme: Bleeding problems [ ] ; Clotting disorders [ ] ; Anemia [ ]    Endocrine: Diabetes [ ] ; Thyroid dysfunction [ ]    Physical Exam/Data:   Vitals:   08/09/22 2145 08/09/22 2152 08/09/22 2200 08/09/22 2215  BP: 102/61  124/86 (!) 133/59  Pulse: 91  93 94  Resp: 11  12 14   SpO2: 97%  100% 100%  Weight:  70.5 kg    Height:  5\' 10"  (1.778 m)     No intake or output data in the 24 hours ending 08/09/22 2234    08/09/2022    9:52 PM 12/08/2018    3:17 PM 04/14/2013    3:15 PM  Last 3 Weights  Weight (lbs) 155 lb 6.4 oz 200 lb 204 lb  Weight (kg) 70.489 kg 90.719 kg 92.534 kg     Body mass index is 22.3 kg/m.  General:  Well nourished, well developed, in no acute distress*** HEENT: normal Lymph: no adenopathy Neck: no*** JVD Endocrine:  No thryomegaly Vascular: No carotid bruits; FA pulses 2+ bilaterally without bruits  Cardiac:  normal S1, S2; RRR; no murmur *** Lungs:  clear to auscultation bilaterally, no wheezing, rhonchi or rales  Abd: soft, nontender, no hepatomegaly  Ext: no*** edema Musculoskeletal:  No deformities, BUE and BLE strength normal and equal Skin: warm and dry  Neuro:  CNs 2-12 intact, no focal abnormalities noted Psych:  Normal  affect   EKG:  The ECG that was done *** was personally reviewed and demonstrates ***  Relevant CV  Studies: ***  Laboratory Data:  High Sensitivity Troponin:   Recent Labs  Lab 08/09/22 1841 08/09/22 2047  TROPONINIHS 2,108* 2,044*      Chemistry Recent Labs  Lab 08/09/22 1841  NA 139  K 4.6  CL 106  CO2 22  GLUCOSE 148*  BUN 25*  CREATININE 1.12  CALCIUM 8.5*  GFRNONAA >60  ANIONGAP 11    Recent Labs  Lab 08/09/22 1841  PROT 5.7*  ALBUMIN 3.0*  AST 40  ALT 19  ALKPHOS 75  BILITOT 1.6*   Hematology Recent Labs  Lab 08/09/22 1841  WBC 6.6  RBC 3.48*  HGB 12.0*  HCT 34.9*  MCV 100.3*  MCH 34.5*  MCHC 34.4  RDW 13.2  PLT 149*   BNP Recent Labs  Lab 08/09/22 1843  BNP 486.3*    DDimer No results for input(s): "DDIMER" in the last 168 hours.  Radiology/Studies:  DG Chest Portable 1 View  Result Date: 08/09/2022 CLINICAL DATA:  Chest pain and syncope EXAM: PORTABLE CHEST 1 VIEW COMPARISON:  Chest x-ray 11/25/2018 FINDINGS: The heart size and mediastinal contours are within normal limits. Both lungs are clear. The visualized skeletal structures are unremarkable. IMPRESSION: No active disease. Electronically Signed   By: Ronney Asters M.D.   On: 08/09/2022 20:04   { If the patient is being seen for chest pain, [place], NSTEMI or STEMI Press F2 to calculate a risk score         :[id number]   {Chest Pain/ACS Risk Score        :[id number]   {Is the patient being seen for CHF?           :[id number]  Assessment and Plan:   ***  Severity of Illness: {Observation/Inpatient:21159}   For questions or updates, please contact [place] Please consult [email or web address] for contact info under     Signed, Dion Body, MD  08/09/2022 10:34 PM

## 2022-08-09 NOTE — Progress Notes (Addendum)
ANTICOAGULATION CONSULT NOTE - Initial Consult  Pharmacy Consult for Heparin Indication: chest pain/ACS  Allergies  Allergen Reactions   Statins     Patient Measurements:   Heparin Dosing Weight: 70.5 kg  Vital Signs: BP: 120/91 (11/18 2100) Pulse Rate: 90 (11/18 2100)  Labs: Recent Labs    08/09/22 1841  HGB 12.0*  HCT 34.9*  PLT 149*  APTT 28  LABPROT 13.7  INR 1.1  CREATININE 1.12  TROPONINIHS 2,108*    CrCl cannot be calculated (Unknown ideal weight.).   Medical History: Past Medical History:  Diagnosis Date   Cellulitis    Dyslipidemia    Hyperlipidemia 2013   LDL particle number 3135 with LDL CALULATED at 151 and 2300 in 1988 was the small LDL particle number,which is very high- not tinterested in taking medications   Systolic murmur     Medications:  (Not in a hospital admission)  Scheduled:  Infusions:  PRN:   Assessment: 50 yom with an unk pmh. Patient is presenting with chest pain and Syncope s/p cardioversion by EMS. Heparin per pharmacy consult placed for chest pain/ACS.  Patient is not on anticoagulation prior to arrival.  Hgb 12; plt 149 [id number] aPTT 28 PT/INR 28/1.1  Goal of Therapy:  Heparin level 0.3-0.7 units/ml Monitor platelets by anticoagulation protocol: Yes   Plan:  Give IV heparin 4000 units bolus x 1 Start heparin infusion at 850 units/hr Check anti-Xa level in 8 hours and daily while on heparin Continue to monitor H&H and platelets  Delmar Landau, PharmD, BCPS 08/09/2022 9:45 PM ED Clinical Pharmacist -  [phone]

## 2022-08-09 NOTE — ED Triage Notes (Signed)
BIB [facility]. EMS was called x2 times today to household for syncopal episodes-pt refused transport both times. 3rd time EMS found pt HR 200's, cool, diaphoretic. Cardioverted 100J first attempt- 2mg  versed fentnyl. All symptoms improve.

## 2022-08-09 NOTE — ED Provider Notes (Signed)
[facility] EMERGENCY DEPARTMENT Provider Note   CSN: [id number] Arrival date & time: 08/09/22  1824     History  Chief Complaint  Patient presents with   Irregular Heart Beat    Jonathan Romero is a 76 year old male with no known PMH (does not routinely follow up) who presents to the ED for syncope.  The patient reports that he has been feeling "out of sorts "and under the weather for the past 3 days or so, but cannot pinpoint specific symptoms.  Today after carrying a box of cat food that weighed 30 pounds and from the porch, he a sensation of heartburn in his chest, had shortness of breath, and then passed out.  His chest pain is exertional. He reports that he had a second episode of the same with passing out while climbing the stairs later this afternoon, and EMS reports that he appeared diaphoretic, pale, and unwell on arrival with low blood pressures.  They cardioverted him for a reported irregularly irregular tachycardia up to 280 bpm and fastest, with improvement in symptoms.  Patient states that his chest discomfort earlier was also accompanied by left arm numbness.  He states that he feels almost back to his normal self now.   The history is provided by the patient.       Home Medications Prior to Admission medications   Medication Sig Start Date End Date Taking? Authorizing Provider  aspirin EC 325 MG tablet Take 325 mg by mouth once.   Yes [provider]  aspirin EC 81 MG EC tablet Take 1 tablet (81 mg total) by mouth daily. Patient not taking: Reported on 08/09/2022 11/26/18   Lavina Hamman, MD  hydrochlorothiazide (HYDRODIURIL) 25 MG tablet Take 1 tablet (25 mg total) by mouth daily. Patient not taking: Reported on 08/09/2022 11/26/18   Lavina Hamman, MD      Allergies    Statins    Review of Systems   Review of Systems   See HPI.    Physical Exam Updated Vital Signs BP (!) 133/59   Pulse 94   Resp 14   Ht 5\' 10"  (1.778 m)   Wt  70.5 kg   SpO2 100%   BMI 22.30 kg/m   Physical Exam Vitals and nursing note reviewed.  Constitutional:      Comments: Thin.  HENT:     Head: Normocephalic and atraumatic.  Eyes:     Extraocular Movements: Extraocular movements intact.  Cardiovascular:     Rate and Rhythm: Normal rate and regular rhythm.     Pulses: Normal pulses.     Heart sounds: Murmur (systolic) heard.  Pulmonary:     Effort: Pulmonary effort is normal.     Breath sounds: Normal breath sounds.  Abdominal:     General: There is no distension.     Palpations: Abdomen is soft.     Tenderness: There is no abdominal tenderness.  Musculoskeletal:     Cervical back: Normal range of motion.     Comments: Trace lower extremity edema bilaterally.  Skin:    General: Skin is warm and dry.  Neurological:     General: No focal deficit present.     Mental Status: He is alert.     Comments: Alert, oriented.     ED Results / Procedures / Treatments   Labs (all labs ordered are listed, but only abnormal results are displayed) Labs Reviewed  CBC WITH DIFFERENTIAL/PLATELET - Abnormal; Notable  for the following components:      Result Value   RBC 3.48 (*)    Hemoglobin 12.0 (*)    HCT 34.9 (*)    MCV 100.3 (*)    MCH 34.5 (*)    Platelets 149 (*)    All other components within normal limits  COMPREHENSIVE METABOLIC PANEL - Abnormal; Notable for the following components:   Glucose, Bld 148 (*)    BUN 25 (*)    Calcium 8.5 (*)    Total Protein 5.7 (*)    Albumin 3.0 (*)    Total Bilirubin 1.6 (*)    All other components within normal limits  BRAIN NATRIURETIC PEPTIDE - Abnormal; Notable for the following components:   B Natriuretic Peptide 486.3 (*)    All other components within normal limits  CBG MONITORING, ED - Abnormal; Notable for the following components:   Glucose-Capillary 154 (*)    All other components within normal limits  TROPONIN I (HIGH SENSITIVITY) - Abnormal; Notable for the following  components:   Troponin I (High Sensitivity) 2,108 (*)    All other components within normal limits  TROPONIN I (HIGH SENSITIVITY) - Abnormal; Notable for the following components:   Troponin I (High Sensitivity) 2,044 (*)    All other components within normal limits  MAGNESIUM  TSH  T4, FREE  PROTIME-INR  APTT  URINALYSIS, ROUTINE W REFLEX MICROSCOPIC  HEPARIN LEVEL (UNFRACTIONATED)    EKG EKG Interpretation  Date/Time:  Saturday August 09 2022 18:35:26 EST Ventricular Rate:  80 PR Interval:  170 QRS Duration: 95 QT Interval:  392 QTC Calculation: 453 R Axis:   82 Text Interpretation: Sinus rhythm Borderline right axis deviation LVH with secondary repolarization abnormality Anterior Q waves, possibly due to LVH when compared to prior, new t wave inversions diffusely. NO STEMI Confirmed by Theda Belfast ([id number]) on 08/09/2022 6:40:15 PM  Radiology DG Chest Portable 1 View  Result Date: 08/09/2022 CLINICAL DATA:  Chest pain and syncope EXAM: PORTABLE CHEST 1 VIEW COMPARISON:  Chest x-ray 11/25/2018 FINDINGS: The heart size and mediastinal contours are within normal limits. Both lungs are clear. The visualized skeletal structures are unremarkable. IMPRESSION: No active disease. Electronically Signed   By: Darliss Cheney M.D.   On: 08/09/2022 20:04    Procedures Procedures    Medications Ordered in ED Medications  heparin ADULT infusion 100 units/mL (25000 units/271mL) (850 Units/hr Intravenous New Bag/Given 08/09/22 2204)  heparin bolus via infusion 4,000 Units (4,000 Units Intravenous Bolus from Bag 08/09/22 2204)    ED Course/ Medical Decision Making/ A&P Clinical Course as of 08/09/22 2234  Sat Aug 09, 2022  2124 Trop 2108.  [DG]  2135 [place]  [DG]    Clinical Course User Index [DG] Stephanie Coup, MD                           Medical Decision Making Problems Addressed: Elevated troponin: acute illness or injury that poses a threat to life or bodily  functions NSTEMI (non-ST elevated myocardial infarction) [facility]): acute illness or injury that poses a threat to life or bodily functions Syncope, unspecified syncope type: acute illness or injury that poses a threat to life or bodily functions  Amount and/or Complexity of Data Reviewed Independent Historian: EMS Labs: ordered. Decision-making details documented in ED Course. Radiology: ordered and independent interpretation performed. Decision-making details documented in ED Course. ECG/medicine tests: ordered and independent interpretation performed. Decision-making details documented in ED  Course. Discussion of management or test interpretation with external provider(s): Cardiology   Risk OTC drugs. Prescription drug management. Decision regarding hospitalization.   Jonathan Romero is a 76 year old male with no known PMH (does not routinely follow up) who presents to the ED for syncope.  On initial exam, patient states that he feels significantly improved after cardioversion with EMS.  He denies any active chest pain or shortness of breath at this time, states he feels almost back to his baseline while at rest.  Monitor shows normal sinus rhythm with rate in the 80s.  There do appear to be ST depressions on the monitor.  Initial concern highest for ACS given exertional chest pain with shortness of breath, numbness on left arm during episodes, and syncope.  Also concern for possible critical aortic stenosis given murmur on exam and exertional dyspnea and syncope.  Given recent reports of generally feeling unwell for several days, myocarditis also remains on the differential.  No focal neurologic deficits on exam to suggest acute intracranial pathology, no headache.  Overall highest concern for cardiac cause of his syncope today.  Patient reports that he took 2 full-strength 324 mg aspirin this morning, therefore aspirin not administered at this time.  EKG on my independent review demonstrates  sinus rhythm, 80 bpm.  There are diffuse ST depressions.  Slight ST elevation in lead V2.   Fingerstick glucose 154.  CBC with hemoglobin 12.0, no leukocytosis to suggest significant infection.  Platelets 149.  Coags within normal limits. CMP overall noncontributory, glucose 148, calcium and total protein low, T. bili 1.6.  LFTs, creatinine within normal limits.  BNP elevated at 486.  TSH within normal limits.  Magnesium 2.0. Initial troponin 2108.   CXR on my independent review demonstrates no widened mediastinum, normal cardiac silhouette.  No focal lung consolidation to suggest pneumonia.  No pulmonary edema, no pneumothorax, no pleural effusion.  Overall unremarkable chest x-ray.  Cardiology consulted (Dr. Renella Cunas) who recommended Heparin drip and plans to admit patient to his service.  Heparin drip initiated in the emergency department.  Patient asymptomatic at this time, remains hemodynamically stable. Patient admitted to Cardiology.         Final Clinical Impression(s) / ED Diagnoses Final diagnoses:  Chest pain, unspecified type  Elevated troponin  Palpitations  Syncope, unspecified syncope type  NSTEMI (non-ST elevated myocardial infarction) [facility])    Rx / DC Orders ED Discharge Orders     None         Renard Matter, MD 08/09/22 2234    Tegeler, Gwenyth Allegra, MD 08/11/22 1539

## 2022-08-10 ENCOUNTER — Encounter (HOSPITAL_COMMUNITY): Payer: Self-pay | Admitting: Student in an Organized Health Care Education/Training Program

## 2022-08-10 ENCOUNTER — Other Ambulatory Visit: Payer: Self-pay

## 2022-08-10 ENCOUNTER — Inpatient Hospital Stay (HOSPITAL_COMMUNITY): Payer: Medicare Other

## 2022-08-10 ENCOUNTER — Other Ambulatory Visit (HOSPITAL_COMMUNITY): Payer: Medicare Other

## 2022-08-10 DIAGNOSIS — I214 Non-ST elevation (NSTEMI) myocardial infarction: Secondary | ICD-10-CM

## 2022-08-10 LAB — ECHOCARDIOGRAM COMPLETE
AR max vel: 0.53 cm2
AV Area VTI: 0.53 cm2
AV Area mean vel: 0.51 cm2
AV Mean grad: 45 mmHg
AV Peak grad: 67.2 mmHg
Ao pk vel: 4.1 m/s
Area-P 1/2: 4.68 cm2
Calc EF: 43.4 %
Height: 70 in
MV VTI: 2.14 cm2
S' Lateral: 3.6 cm
Single Plane A2C EF: 47.8 %
Single Plane A4C EF: 41.1 %
Weight: 2486.4 oz

## 2022-08-10 LAB — URINALYSIS, ROUTINE W REFLEX MICROSCOPIC
Bilirubin Urine: NEGATIVE
Glucose, UA: NEGATIVE mg/dL
Hgb urine dipstick: NEGATIVE
Ketones, ur: NEGATIVE mg/dL
Leukocytes,Ua: NEGATIVE
Nitrite: NEGATIVE
Protein, ur: NEGATIVE mg/dL
Specific Gravity, Urine: 1.026 (ref 1.005–1.030)
pH: 6 (ref 5.0–8.0)

## 2022-08-10 LAB — LIPID PANEL
Cholesterol: 215 mg/dL — ABNORMAL HIGH (ref 0–200)
HDL: 45 mg/dL (ref >40)
LDL Cholesterol: 160 mg/dL — ABNORMAL HIGH (ref 0–99)
Total CHOL/HDL Ratio: 4.8 RATIO
Triglycerides: 49 mg/dL (ref <150)
VLDL: 10 mg/dL (ref 0–40)

## 2022-08-10 LAB — HEPARIN LEVEL (UNFRACTIONATED)
Heparin Unfractionated: 0.15 IU/mL — ABNORMAL LOW (ref 0.30–0.70)
Heparin Unfractionated: 0.22 IU/mL — ABNORMAL LOW (ref 0.30–0.70)
Heparin Unfractionated: 0.32 IU/mL (ref 0.30–0.70)

## 2022-08-10 MED ORDER — METOPROLOL TARTRATE 12.5 MG HALF TABLET
12.5000 mg | ORAL_TABLET | Freq: Two times a day (BID) | ORAL | Status: DC
  Administered 2022-08-10 – 2022-08-12 (×5): 12.5 mg via ORAL
  Filled 2022-08-10 (×5): qty 1

## 2022-08-10 MED ORDER — SODIUM CHLORIDE 0.9% FLUSH: 3.0000 mL | INTRAVENOUS | Status: DC | PRN

## 2022-08-10 MED ORDER — SODIUM CHLORIDE 0.9% FLUSH
3.0000 mL | Freq: Two times a day (BID) | INTRAVENOUS | Status: DC
  Administered 2022-08-10: 3 mL via INTRAVENOUS

## 2022-08-10 MED ORDER — HEPARIN BOLUS VIA INFUSION
1000.0000 [IU] | Freq: Once | INTRAVENOUS | Status: AC
  Administered 2022-08-10: 1000 [IU] via INTRAVENOUS
  Filled 2022-08-10: qty 1000

## 2022-08-10 MED ORDER — SODIUM CHLORIDE 0.9 % WEIGHT BASED INFUSION
3.0000 mL/kg/h | INTRAVENOUS | Status: DC
  Administered 2022-08-11: 3 mL/kg/h via INTRAVENOUS

## 2022-08-10 MED ORDER — SODIUM CHLORIDE 0.9 % WEIGHT BASED INFUSION
1.0000 mL/kg/h | INTRAVENOUS | Status: DC
  Administered 2022-08-11: 1 mL/kg/h via INTRAVENOUS

## 2022-08-10 MED ORDER — SODIUM CHLORIDE 0.9 % IV SOLN: 250.0000 mL | INTRAVENOUS | Status: DC | PRN

## 2022-08-10 MED ORDER — HEPARIN BOLUS VIA INFUSION
2000.0000 [IU] | Freq: Once | INTRAVENOUS | Status: AC
  Administered 2022-08-10: 2000 [IU] via INTRAVENOUS
  Filled 2022-08-10: qty 2000

## 2022-08-10 NOTE — Progress Notes (Signed)
ANTICOAGULATION CONSULT NOTE - Follow-up Note  Pharmacy Consult for Heparin Indication: chest pain/ACS  Allergies  Allergen Reactions   Statins     Patient Measurements: Height: 5\' 10"  (177.8 cm) Weight: 70.5 kg (155 lb 6.4 oz) IBW/kg (Calculated) : 73 Heparin Dosing Weight: 70.5 kg  Vital Signs: Temp: 98 F (36.7 C) (11/19 1131) Temp Source: Oral (11/19 1131) BP: 116/74 (11/19 1115) Pulse Rate: 95 (11/19 1115)  Labs: Recent Labs    08/09/22 1841 08/09/22 2047 08/10/22 0426  HGB 12.0*  --   --   HCT 34.9*  --   --   PLT 149*  --   --   APTT 28  --   --   LABPROT 13.7  --   --   INR 1.1  --   --   HEPARINUNFRC  --   --  0.15*  CREATININE 1.12  --   --   TROPONINIHS 2,108* 2,044*  --     Estimated Creatinine Clearance: 54.2 mL/min (by C-G formula based on SCr of 1.12 mg/dL).   Medical History: Past Medical History:  Diagnosis Date   Cellulitis    Dyslipidemia    Hyperlipidemia 2013   LDL particle number [id number] with LDL CALULATED at 151 and 2300 in 1988 was the small LDL particle number,which is very high- not tinterested in taking medications   Systolic murmur     Medications:  (Not in a hospital admission)  Scheduled:   aspirin EC  81 mg Oral Daily   metoprolol tartrate  12.5 mg Oral BID   sodium chloride flush  3 mL Intravenous Q12H   Infusions:   sodium chloride     [START ON 08/11/2022] sodium chloride     Followed by   08/13/2022 ON 08/11/2022] sodium chloride     heparin 1,000 Units/hr (08/10/22 0524)   PRN: sodium chloride, acetaminophen, nitroGLYCERIN, ondansetron (ZOFRAN) IV, sodium chloride flush  Assessment: 76 yom with an unk pmh. Patient is presenting with chest pain and Syncope s/p cardioversion by EMS. Heparin per pharmacy consult placed for chest pain/ACS.   Patient was not on anticoagulation prior to arrival. Started on 850 units/hr IV heparin following 4000 unit IV heparin bolus. Resulting heparin level was low and infusion increased  to 1000 units/hr following 2000 unit bolus of heparin  Repeat heparin level following increase is 0.22 which is sub-therapeutic.  No issues with infusion or bleeding per RN.  CBC stable  Goal of Therapy:  Heparin level 0.3-0.7 units/ml Monitor platelets by anticoagulation protocol: Yes   Plan:  Give IV heparin 1000 units bolus x 1 Increase heparin infusion to 1150 units/hr Check anti-Xa level at 2200 and daily while on heparin Continue to monitor H&H and platelets  08/12/22, PharmD, BCPS 08/10/2022 12:12 PM ED Clinical Pharmacist -  [phone]

## 2022-08-10 NOTE — H&P (View-Only) (Signed)
Rounding Note    Patient Name: Jonathan Romero Date of Encounter: 08/10/2022  [facility] Cardiologist: None   Subjective   NAEO. Still in ER this AM.  Inpatient Medications    Scheduled Meds:  aspirin EC  81 mg Oral Daily   metoprolol tartrate  12.5 mg Oral BID   Continuous Infusions:  heparin 1,000 Units/hr (08/10/22 0524)   PRN Meds: acetaminophen, nitroGLYCERIN, ondansetron (ZOFRAN) IV   Vital Signs    Vitals:   08/10/22 0500 08/10/22 0600 08/10/22 0700 08/10/22 0730  BP: 129/83 (!) 146/94 (!) 131/91 (!) 138/95  Pulse: 96 99 87 96  Resp: 16 18 16 15   Temp:    97.8 F (36.6 C)  TempSrc:    Oral  SpO2: 92% 92% 93% 91%  Weight:      Height:       No intake or output data in the 24 hours ending 08/10/22 0814    08/09/2022    9:52 PM 12/08/2018    3:17 PM 04/14/2013    3:15 PM  Last 3 Weights  Weight (lbs) 155 lb 6.4 oz 200 lb 204 lb  Weight (kg) 70.489 kg 90.719 kg 92.534 kg      Telemetry    Sinus rhythm. No SVT on monitor this AM. Personally Reviewed  ECG    Personally Reviewed  Physical Exam   GEN: No acute distress.  Elderly. Neck: No JVD Cardiac: RRR, III/VI systolic murmur at USB  Respiratory: Clear to auscultation bilaterally. GI: Soft, nontender, non-distended  MS: No edema; No deformity. Neuro:  Nonfocal  Psych: Normal affect   Labs    High Sensitivity Troponin:   Recent Labs  Lab 08/09/22 1841 08/09/22 2047  TROPONINIHS 2,108* 2,044*     Chemistry Recent Labs  Lab 08/09/22 1841  NA 139  K 4.6  CL 106  CO2 22  GLUCOSE 148*  BUN 25*  CREATININE 1.12  CALCIUM 8.5*  MG 2.0  PROT 5.7*  ALBUMIN 3.0*  AST 40  ALT 19  ALKPHOS 75  BILITOT 1.6*  GFRNONAA >60  ANIONGAP 11    Lipids No results for input(s): "CHOL", "TRIG", "HDL", "LABVLDL", "LDLCALC", "CHOLHDL" in the last 168 hours.  Hematology Recent Labs  Lab 08/09/22 1841  WBC 6.6  RBC 3.48*  HGB 12.0*  HCT 34.9*  MCV 100.3*  MCH 34.5*  MCHC  34.4  RDW 13.2  PLT 149*   Thyroid  Recent Labs  Lab 08/09/22 1841 08/09/22 1843  TSH  --  2.157  FREET4 0.95  --     BNP Recent Labs  Lab 08/09/22 1843  BNP 486.3*    DDimer No results for input(s): "DDIMER" in the last 168 hours.   Radiology    DG Chest Portable 1 View  Result Date: 08/09/2022 CLINICAL DATA:  Chest pain and syncope EXAM: PORTABLE CHEST 1 VIEW COMPARISON:  Chest x-ray 11/25/2018 FINDINGS: The heart size and mediastinal contours are within normal limits. Both lungs are clear. The visualized skeletal structures are unremarkable. IMPRESSION: No active disease. Electronically Signed   By: 01/25/2019 M.D.   On: 08/09/2022 20:04      Assessment & Plan    #NSTEMI #AS Cont asa 81mg  Cont heparin gtt Cont metoprolol Plan coronary evaluation Monday given troponin elevation  #HLD Outpatient consideration of PCSK9i  Keep NPO after MN.  For questions or updates, please contact [facility] Please consult [email or web address] for contact info under  Signed, Vickie Epley, MD  08/10/2022, 8:14 AM

## 2022-08-10 NOTE — Progress Notes (Signed)
Rounding Note    Patient Name: Jonathan Romero Date of Encounter: 08/10/2022  [facility] Cardiologist: None   Subjective   NAEO. Still in ER this AM.  Inpatient Medications    Scheduled Meds:  aspirin EC  81 mg Oral Daily   metoprolol tartrate  12.5 mg Oral BID   Continuous Infusions:  heparin 1,000 Units/hr (08/10/22 0524)   PRN Meds: acetaminophen, nitroGLYCERIN, ondansetron (ZOFRAN) IV   Vital Signs    Vitals:   08/10/22 0500 08/10/22 0600 08/10/22 0700 08/10/22 0730  BP: 129/83 (!) 146/94 (!) 131/91 (!) 138/95  Pulse: 96 99 87 96  Resp: 16 18 16 15   Temp:    97.8 F (36.6 C)  TempSrc:    Oral  SpO2: 92% 92% 93% 91%  Weight:      Height:       No intake or output data in the 24 hours ending 08/10/22 0814    08/09/2022    9:52 PM 12/08/2018    3:17 PM 04/14/2013    3:15 PM  Last 3 Weights  Weight (lbs) 155 lb 6.4 oz 200 lb 204 lb  Weight (kg) 70.489 kg 90.719 kg 92.534 kg      Telemetry    Sinus rhythm. No SVT on monitor this AM. Personally Reviewed  ECG    Personally Reviewed  Physical Exam   GEN: No acute distress.  Elderly. Neck: No JVD Cardiac: RRR, III/VI systolic murmur at USB  Respiratory: Clear to auscultation bilaterally. GI: Soft, nontender, non-distended  MS: No edema; No deformity. Neuro:  Nonfocal  Psych: Normal affect   Labs    High Sensitivity Troponin:   Recent Labs  Lab 08/09/22 1841 08/09/22 2047  TROPONINIHS 2,108* 2,044*     Chemistry Recent Labs  Lab 08/09/22 1841  NA 139  K 4.6  CL 106  CO2 22  GLUCOSE 148*  BUN 25*  CREATININE 1.12  CALCIUM 8.5*  MG 2.0  PROT 5.7*  ALBUMIN 3.0*  AST 40  ALT 19  ALKPHOS 75  BILITOT 1.6*  GFRNONAA >60  ANIONGAP 11    Lipids No results for input(s): "CHOL", "TRIG", "HDL", "LABVLDL", "LDLCALC", "CHOLHDL" in the last 168 hours.  Hematology Recent Labs  Lab 08/09/22 1841  WBC 6.6  RBC 3.48*  HGB 12.0*  HCT 34.9*  MCV 100.3*  MCH 34.5*  MCHC  34.4  RDW 13.2  PLT 149*   Thyroid  Recent Labs  Lab 08/09/22 1841 08/09/22 1843  TSH  --  2.157  FREET4 0.95  --     BNP Recent Labs  Lab 08/09/22 1843  BNP 486.3*    DDimer No results for input(s): "DDIMER" in the last 168 hours.   Radiology    DG Chest Portable 1 View  Result Date: 08/09/2022 CLINICAL DATA:  Chest pain and syncope EXAM: PORTABLE CHEST 1 VIEW COMPARISON:  Chest x-ray 11/25/2018 FINDINGS: The heart size and mediastinal contours are within normal limits. Both lungs are clear. The visualized skeletal structures are unremarkable. IMPRESSION: No active disease. Electronically Signed   By: 01/25/2019 M.D.   On: 08/09/2022 20:04      Assessment & Plan    #NSTEMI #AS Cont asa 81mg  Cont heparin gtt Cont metoprolol Plan coronary evaluation Monday given troponin elevation  #HLD Outpatient consideration of PCSK9i  Keep NPO after MN.  For questions or updates, please contact [facility] Please consult [email or web address] for contact info under  Signed, Vickie Epley, MD  08/10/2022, 8:14 AM

## 2022-08-10 NOTE — Progress Notes (Signed)
Echocardiogram 2D Echocardiogram has been performed.   Warren Lacy Okey Zelek RDCS 08/10/2022, 3:57 PM

## 2022-08-10 NOTE — ED Notes (Signed)
Daughter at bedside, pt up to use bsc without any difficulty

## 2022-08-10 NOTE — Progress Notes (Signed)
ANTICOAGULATION CONSULT NOTE  Pharmacy Consult for Heparin Indication: chest pain/ACS Brief A/P: Heparin level within goal range Continue Heparin at current rate    Allergies  Allergen Reactions   Statins     Patient Measurements: Height: 5\' 10"  (177.8 cm) Weight: 70.5 kg (155 lb 6.4 oz) IBW/kg (Calculated) : 73 Heparin Dosing Weight: 70.5 kg  Vital Signs: Temp: 98.2 F (36.8 C) (11/19 1950) Temp Source: Oral (11/19 1621) BP: 122/78 (11/19 1950) Pulse Rate: 97 (11/19 1950)  Labs: Recent Labs    08/09/22 1841 08/09/22 2047 08/10/22 0426 08/10/22 1231 08/10/22 2136  HGB 12.0*  --   --   --   --   HCT 34.9*  --   --   --   --   PLT 149*  --   --   --   --   APTT 28  --   --   --   --   LABPROT 13.7  --   --   --   --   INR 1.1  --   --   --   --   HEPARINUNFRC  --   --  0.15* 0.22* 0.32  CREATININE 1.12  --   --   --   --   TROPONINIHS 2,108* 2,044*  --   --   --      Estimated Creatinine Clearance: 54.2 mL/min (by C-G formula based on SCr of 1.12 mg/dL).  Assessment: 76 y.o. male with chest pain for heparin   Goal of Therapy:  Heparin level 0.3-0.7 units/ml Monitor platelets by anticoagulation protocol: Yes   Plan:  Continue Heparin at current rate   70, PharmD, BCPS

## 2022-08-10 NOTE — ED Notes (Signed)
Meal given. Pt set up to eat. No assistance eating. No complaints at this time. Made aware of need for urine.

## 2022-08-10 NOTE — ED Notes (Signed)
ED TO INPATIENT HANDOFF REPORT  ED Nurse Name and Phone #:   S Name/Age/Gender Jonathan Romero 76 y.o. male Room/Bed: 001C/001C  Code Status   Code Status: Full Code  Home/SNF/Other Home Patient oriented to: self, place, time, and situation Is this baseline? Yes   Triage Complete: Triage complete  Chief Complaint NSTEMI (non-ST elevated myocardial infarction) [facility](HCC) [I21.4]  Triage Note BIB [facility]. EMS was called x2 times today to household for syncopal episodes-pt refused transport both times. 3rd time EMS found pt HR 200's, cool, diaphoretic. Cardioverted 100J first attempt- 2mg  versed 50mcg fentnyl. All symptoms improve.    Allergies Allergies  Allergen Reactions   Statins     Level of Care/Admitting Diagnosis ED Disposition     ED Disposition  Admit   Condition  --   Comment  [facility] [[id number]]  Level of Care: Progressive [102]  Admit to Progressive based on following criteria: CARDIOVASCULAR & THORACIC of moderate stability with acute coronary syndrome symptoms/low risk myocardial infarction/hypertensive urgency/arrhythmias/heart failure potentially compromising stability and stable post cardiovascular intervention patients.  May admit [facility] to [facility] or [facility] if equivalent level of care is available:: No  Covid Evaluation: Asymptomatic - no recent exposure (last 10 days) testing not required  Diagnosis: NSTEMI (non-ST elevated myocardial infarction) [facility](HCC) [[id number]][[id number]]  Admitting Physician: Linton RumpARLISLE, MATTHEW A [[id number]]  Attending Physician: Lewayne BuntingRENSHAW, BRIAN S [[id number]]  Certification:: I certify there are rare and unusual circumstances requiring inpatient admission  Estimated Length of Stay: 3          B Medical/Surgery History Past Medical History:  Diagnosis Date   Cellulitis    Dyslipidemia    Hyperlipidemia 2013   LDL particle number [id number] with LDL CALULATED at 151 and 2300 in 1988 was the small LDL  particle number,which is very high- not tinterested in taking medications   Systolic murmur    Past Surgical History:  Procedure Laterality Date   CATARACT EXTRACTION     DOPPLER ECHOCARDIOGRAPHY  12/21/2012   EF 55-60%,   DOPPLER ECHOCARDIOGRAPHY  12/16/2011   MILD AORTIC STENOSIS,PEAK AND MEAN GRADIENTS OF 18 AND 8 mmHg and a valve area of around 2 square cm.      A IV Location/Drains/Wounds Patient Lines/Drains/Airways Status     Active Line/Drains/Airways     Name Placement date Placement time Site Days   Peripheral IV 08/09/22 18 G Anterior;Distal;Left Forearm 08/09/22  2100  Forearm  1            Intake/Output Last 24 hours  Intake/Output Summary (Last 24 hours) at 08/10/2022 1154 Last data filed at 08/10/2022 [id number] Gross per 24 hour  Intake 120 ml  Output 200 ml  Net -80 ml    Labs/Imaging Results for orders placed or performed during the hospital encounter of 08/09/22 (from the past 48 hour(s))  Troponin I (High Sensitivity)     Status: Abnormal   Collection Time: 08/09/22  6:41 PM  Result Value Ref Range   Troponin I (High Sensitivity) 2,108 (HH) <18 ng/L    Comment: CRITICAL RESULT CALLED TO, READ BACK BY AND VERIFIED WITH Valeda Malm. GELMANN, MD, 2155 08/09/22, A. RAMSEY (NOTE) Elevated high sensitivity troponin I (hsTnI) values and significant  changes across serial measurements may suggest ACS but many other  chronic and acute conditions are known to elevate hsTnI results.  Refer to the "Links" section for chest pain algorithms and additional  guidance. Performed at [facility], [address] [place], [place] [address]   CBC with Differential     Status: Abnormal   Collection Time: 08/09/22  6:41 PM  Result Value Ref Range   WBC 6.6 4.0 - 10.5 K/uL   RBC 3.48 (L) 4.22 - 5.81 MIL/uL   Hemoglobin 12.0 (L) 13.0 - 17.0 g/dL   HCT 65.4 (L) 65.0 - 35.4 %   MCV 100.3 (H) 80.0 - 100.0 fL   MCH 34.5 (H) 26.0 - 34.0 pg   MCHC 34.4 30.0 - 36.0 g/dL    RDW 65.6 81.2 - 75.1 %   Platelets 149 (L) 150 - 400 K/uL   nRBC 0.0 0.0 - 0.2 %   Neutrophils Relative % 70 %   Neutro Abs 4.6 1.7 - 7.7 K/uL   Lymphocytes Relative 23 %   Lymphs Abs 1.5 0.7 - 4.0 K/uL   Monocytes Relative 5 %   Monocytes Absolute 0.4 0.1 - 1.0 K/uL   Eosinophils Relative 1 %   Eosinophils Absolute 0.1 0.0 - 0.5 K/uL   Basophils Relative 1 %   Basophils Absolute 0.1 0.0 - 0.1 K/uL   Immature Granulocytes 0 %   Abs Immature Granulocytes 0.02 0.00 - 0.07 K/uL    Comment: Performed at [facility], [address], [place], [place] [address]  Comprehensive metabolic panel     Status: Abnormal   Collection Time: 08/09/22  6:41 PM  Result Value Ref Range   Sodium 139 135 - 145 mmol/L   Potassium 4.6 3.5 - 5.1 mmol/L   Chloride 106 98 - 111 mmol/L   CO2 22 22 - 32 mmol/L   Glucose, Bld 148 (H) 70 - 99 mg/dL    Comment: Glucose reference range applies only to samples taken after fasting for at least 8 hours.   BUN 25 (H) 8 - 23 mg/dL   Creatinine, Ser 4.94 0.61 - 1.24 mg/dL   Calcium 8.5 (L) 8.9 - 10.3 mg/dL   Total Protein 5.7 (L) 6.5 - 8.1 g/dL   Albumin 3.0 (L) 3.5 - 5.0 g/dL   AST 40 15 - 41 U/L   ALT 19 0 - 44 U/L   Alkaline Phosphatase 75 38 - 126 U/L   Total Bilirubin 1.6 (H) 0.3 - 1.2 mg/dL   GFR, Estimated >49 >67 mL/min    Comment: (NOTE) Calculated using the CKD-EPI Creatinine Equation (2021)    Anion gap 11 5 - 15    Comment: Performed at [facility], [address], [place], [place] [address]  Magnesium     Status: None   Collection Time: 08/09/22  6:41 PM  Result Value Ref Range   Magnesium 2.0 1.7 - 2.4 mg/dL    Comment: Performed at [facility], [address], [place], [place] [address]  T4, free     Status: None   Collection Time: 08/09/22  6:41 PM  Result Value Ref Range   Free T4 0.95 0.61 - 1.12 ng/dL    Comment: (NOTE) Biotin ingestion may interfere with free T4 tests. If the results are inconsistent with the TSH  level, previous test results, or the clinical presentation, then consider biotin interference. If needed, order repeat testing after stopping biotin. Performed at [facility], [address] [place], [place] [address]   Protime-INR (coagulopathy lab panel)     Status: None   Collection Time: 08/09/22  6:41 PM  Result Value Ref Range   Prothrombin Time 13.7 11.4 -  15.2 seconds   INR 1.1 0.8 - 1.2    Comment: (NOTE) INR goal varies based on device and disease states. Performed at [facility], [address], [place], [place] [address]   APTT (coagulopathy lab panel)     Status: None   Collection Time: 08/09/22  6:41 PM  Result Value Ref Range   aPTT 28 24 - 36 seconds    Comment: Performed at [facility], [address] [place], [place] [address]  CBG monitoring, ED     Status: Abnormal   Collection Time: 08/09/22  6:42 PM  Result Value Ref Range   Glucose-Capillary 154 (H) 70 - 99 mg/dL    Comment: Glucose reference range applies only to samples taken after fasting for at least 8 hours.  TSH     Status: None   Collection Time: 08/09/22  6:43 PM  Result Value Ref Range   TSH 2.157 0.350 - 4.500 uIU/mL    Comment: Performed by a 3rd Generation assay with a functional sensitivity of <=0.01 uIU/mL. Performed at [facility], [address] [place], [place] [address]   Brain natriuretic peptide     Status: Abnormal   Collection Time: 08/09/22  6:43 PM  Result Value Ref Range   B Natriuretic Peptide 486.3 (H) 0.0 - 100.0 pg/mL    Comment: Performed at [facility], [address], [place], [place] [address]  Troponin I (High Sensitivity)     Status: Abnormal   Collection Time: 08/09/22  8:47 PM  Result Value Ref Range   Troponin I (High Sensitivity) 2,044 (HH) <18 ng/L    Comment: CRITICAL VALUE NOTED.  VALUE IS CONSISTENT WITH PREVIOUSLY REPORTED AND CALLED VALUE. (NOTE) Elevated high sensitivity troponin I (hsTnI) values and significant   changes across serial measurements may suggest ACS but many other  chronic and acute conditions are known to elevate hsTnI results.  Refer to the "Links" section for chest pain algorithms and additional  guidance. Performed at [facility], [address] [place], [place] [address]   Urinalysis, Routine w reflex microscopic Urine, Clean Catch     Status: None   Collection Time: 08/10/22  2:35 AM  Result Value Ref Range   Color, Urine YELLOW YELLOW   APPearance CLEAR CLEAR   Specific Gravity, Urine 1.026 1.005 - 1.030   pH 6.0 5.0 - 8.0   Glucose, UA NEGATIVE NEGATIVE mg/dL   Hgb urine dipstick NEGATIVE NEGATIVE   Bilirubin Urine NEGATIVE NEGATIVE   Ketones, ur NEGATIVE NEGATIVE mg/dL   Protein, ur NEGATIVE NEGATIVE mg/dL   Nitrite NEGATIVE NEGATIVE   Leukocytes,Ua NEGATIVE NEGATIVE    Comment: Performed at [facility], [address] [place], [place] [address]  Heparin level (unfractionated)     Status: Abnormal   Collection Time: 08/10/22  4:26 AM  Result Value Ref Range   Heparin Unfractionated 0.15 (L) 0.30 - 0.70 IU/mL    Comment: (NOTE) The clinical reportable range upper limit is being lowered to >1.10 to align with the FDA approved guidance for the current laboratory assay.  If heparin results are below expected values, and patient dosage has  been confirmed, suggest follow up testing of antithrombin III levels. Performed at [facility], [address] [place], [place] [address]    DG Chest Portable 1 View  Result Date: 08/09/2022 CLINICAL DATA:  Chest pain and syncope EXAM: PORTABLE CHEST 1 VIEW COMPARISON:  Chest  x-ray 11/25/2018 FINDINGS: The heart size and mediastinal contours are within normal limits. Both lungs are clear. The visualized skeletal structures are unremarkable. IMPRESSION: No active disease. Electronically Signed   By: Darliss Cheney M.D.   On: 08/09/2022 20:04    Pending Labs Unresulted Labs (From admission, onward)      Start     Ordered   08/11/22 0500  Heparin level (unfractionated)  Daily at 5am,   R      08/09/22 2153   08/10/22 1300  Heparin level (unfractionated)  Once-Timed,   TIMED        08/10/22 0522   08/10/22 0554  Lipid panel  Add-on,   AD        08/10/22 0553   08/10/22 0554  Hemoglobin A1c  Add-on,   AD        08/10/22 0553   08/10/22 0500  Lipoprotein A (LPA)  Tomorrow morning,   R        08/09/22 2253            Vitals/Pain Today's Vitals   08/10/22 0945 08/10/22 1030 08/10/22 1115 08/10/22 1131  BP: 122/77 116/78 116/74   Pulse: 96 95 95   Resp: 13 18 16    Temp:    98 F (36.7 C)  TempSrc:    Oral  SpO2: 95% 96% 95%   Weight:      Height:      PainSc:        Isolation Precautions No active isolations  Medications Medications  heparin ADULT infusion 100 units/mL (25000 units/259mL) (1,000 Units/hr Intravenous Rate/Dose Change 08/10/22 0524)  aspirin EC tablet 81 mg (81 mg Oral Given 08/10/22 1052)  nitroGLYCERIN (NITROSTAT) SL tablet 0.4 mg (has no administration in time range)  acetaminophen (TYLENOL) tablet 650 mg (has no administration in time range)  ondansetron (ZOFRAN) injection 4 mg (has no administration in time range)  metoprolol tartrate (LOPRESSOR) tablet 12.5 mg (12.5 mg Oral Given 08/10/22 1052)  sodium chloride flush (NS) 0.9 % injection 3 mL (3 mLs Intravenous Given 08/10/22 1051)  sodium chloride flush (NS) 0.9 % injection 3 mL (has no administration in time range)  0.9 %  sodium chloride infusion (has no administration in time range)  0.9% sodium chloride infusion (has no administration in time range)    Followed by  0.9% sodium chloride infusion (has no administration in time range)  heparin bolus via infusion 4,000 Units (4,000 Units Intravenous Bolus from Bag 08/09/22 2204)  heparin bolus via infusion 2,000 Units (2,000 Units Intravenous Bolus from Bag 08/10/22 0526)    Mobility walks Low fall risk   Focused Assessments Cardiac  Assessment Handoff:  Cardiac Rhythm: Sinus tachycardia Lab Results  Component Value Date   TROPONINI <0.03 11/25/2018   No results found for: "DDIMER" Does the Patient currently have chest pain? No    R Recommendations: See Admitting Provider Note  Report given to:   Additional Notes:

## 2022-08-10 NOTE — Progress Notes (Signed)
ANTICOAGULATION CONSULT NOTE   Pharmacy Consult for Heparin Indication: chest pain/ACS  Allergies  Allergen Reactions   Statins     Patient Measurements: Height: 5\' 10"  (177.8 cm) Weight: 70.5 kg (155 lb 6.4 oz) IBW/kg (Calculated) : 73 Heparin Dosing Weight: 70.5 kg  Vital Signs: Temp: 98.5 F (36.9 C) (11/19 0350) Temp Source: Oral (11/19 0350) BP: 133/87 (11/19 0350) Pulse Rate: 99 (11/19 0350)  Labs: Recent Labs    08/09/22 1841 08/09/22 2047 08/10/22 0426  HGB 12.0*  --   --   HCT 34.9*  --   --   PLT 149*  --   --   APTT 28  --   --   LABPROT 13.7  --   --   INR 1.1  --   --   HEPARINUNFRC  --   --  0.15*  CREATININE 1.12  --   --   TROPONINIHS 2,108* 2,044*  --      Estimated Creatinine Clearance: 54.2 mL/min (by C-G formula based on SCr of 1.12 mg/dL).   Medical History: Past Medical History:  Diagnosis Date   Cellulitis    Dyslipidemia    Hyperlipidemia 2013   LDL particle number 3135 with LDL CALULATED at 151 and 2300 in 1988 was the small LDL particle number,which is very high- not tinterested in taking medications   Systolic murmur     Medications:  (Not in a hospital admission)  Scheduled:  Infusions:  PRN:   Assessment: 62 yom with an unk pmh. Patient is presenting with chest pain and Syncope s/p cardioversion by EMS. Heparin per pharmacy consult placed for chest pain/ACS.  Patient is not on anticoagulation prior to arrival.  Hgb 12; plt 149 [id number] aPTT 28 PT/INR 28/1.1  11/19 AM update:  Heparin level sub-therapeutic   Goal of Therapy:  Heparin level 0.3-0.7 units/ml Monitor platelets by anticoagulation protocol: Yes   Plan:  Give IV heparin 2000 units bolus x 1 Inc heparin to 1000 units/hr Check anti-Xa level in 8 hours and daily while on heparin Continue to monitor H&H and platelets  12/19, PharmD, BCPS Clinical Pharmacist Phone: [phone]

## 2022-08-11 ENCOUNTER — Encounter (HOSPITAL_COMMUNITY): Payer: Self-pay | Admitting: Certified Registered Nurse Anesthetist

## 2022-08-11 ENCOUNTER — Encounter (HOSPITAL_COMMUNITY)
Admission: EM | Disposition: A | Discharge status: Home / Self Care | Payer: Self-pay | Source: Home / Self Care | Attending: Cardiology

## 2022-08-11 ENCOUNTER — Encounter (HOSPITAL_COMMUNITY): Payer: Self-pay | Admitting: Interventional Cardiology

## 2022-08-11 ENCOUNTER — Inpatient Hospital Stay (HOSPITAL_COMMUNITY): Payer: Medicare Other

## 2022-08-11 DIAGNOSIS — R079 Chest pain, unspecified: Secondary | ICD-10-CM

## 2022-08-11 DIAGNOSIS — R55 Syncope and collapse: Secondary | ICD-10-CM

## 2022-08-11 DIAGNOSIS — R002 Palpitations: Secondary | ICD-10-CM

## 2022-08-11 DIAGNOSIS — R7989 Other specified abnormal findings of blood chemistry: Secondary | ICD-10-CM

## 2022-08-11 DIAGNOSIS — I2510 Atherosclerotic heart disease of native coronary artery without angina pectoris: Secondary | ICD-10-CM

## 2022-08-11 DIAGNOSIS — I350 Nonrheumatic aortic (valve) stenosis: Secondary | ICD-10-CM

## 2022-08-11 DIAGNOSIS — Z01810 Encounter for preprocedural cardiovascular examination: Secondary | ICD-10-CM

## 2022-08-11 HISTORY — PX: RIGHT/LEFT HEART CATH AND CORONARY ANGIOGRAPHY: CATH118266

## 2022-08-11 LAB — POCT I-STAT EG7
Acid-base deficit: 1 mmol/L (ref 0.0–2.0)
Acid-base deficit: 1 mmol/L (ref 0.0–2.0)
Bicarbonate: 23.5 mmol/L (ref 20.0–28.0)
Bicarbonate: 24 mmol/L (ref 20.0–28.0)
Calcium, Ion: 1.21 mmol/L (ref 1.15–1.40)
Calcium, Ion: 1.22 mmol/L (ref 1.15–1.40)
HCT: 36 % — ABNORMAL LOW (ref 39.0–52.0)
HCT: 37 % — ABNORMAL LOW (ref 39.0–52.0)
Hemoglobin: 12.2 g/dL — ABNORMAL LOW (ref 13.0–17.0)
Hemoglobin: 12.6 g/dL — ABNORMAL LOW (ref 13.0–17.0)
O2 Saturation: 57 %
O2 Saturation: 57 %
Potassium: 3.7 mmol/L (ref 3.5–5.1)
Potassium: 3.7 mmol/L (ref 3.5–5.1)
Sodium: 141 mmol/L (ref 135–145)
Sodium: 142 mmol/L (ref 135–145)
TCO2: 25 mmol/L (ref 22–32)
TCO2: 25 mmol/L (ref 22–32)
pCO2, Ven: 38.5 mmHg — ABNORMAL LOW (ref 44–60)
pCO2, Ven: 39.4 mmHg — ABNORMAL LOW (ref 44–60)
pH, Ven: 7.393 (ref 7.25–7.43)
pH, Ven: 7.393 (ref 7.25–7.43)
pO2, Ven: 30 mmHg — CL (ref 32–45)
pO2, Ven: 30 mmHg — CL (ref 32–45)

## 2022-08-11 LAB — BASIC METABOLIC PANEL
Anion gap: 9 (ref 5–15)
BUN: 24 mg/dL — ABNORMAL HIGH (ref 8–23)
CO2: 23 mmol/L (ref 22–32)
Calcium: 8.5 mg/dL — ABNORMAL LOW (ref 8.9–10.3)
Chloride: 109 mmol/L (ref 98–111)
Creatinine, Ser: 1.11 mg/dL (ref 0.61–1.24)
GFR, Estimated: >60 mL/min (ref >60)
Glucose, Bld: 147 mg/dL — ABNORMAL HIGH (ref 70–99)
Potassium: 3.8 mmol/L (ref 3.5–5.1)
Sodium: 141 mmol/L (ref 135–145)

## 2022-08-11 LAB — GLUCOSE, CAPILLARY: Glucose-Capillary: 179 mg/dL — ABNORMAL HIGH (ref 70–99)

## 2022-08-11 LAB — POCT I-STAT 7, (LYTES, BLD GAS, ICA,H+H)
Acid-base deficit: 3 mmol/L — ABNORMAL HIGH (ref 0.0–2.0)
Bicarbonate: 21.6 mmol/L (ref 20.0–28.0)
Calcium, Ion: 1.21 mmol/L (ref 1.15–1.40)
HCT: 35 % — ABNORMAL LOW (ref 39.0–52.0)
Hemoglobin: 11.9 g/dL — ABNORMAL LOW (ref 13.0–17.0)
O2 Saturation: 87 %
Potassium: 3.8 mmol/L (ref 3.5–5.1)
Sodium: 140 mmol/L (ref 135–145)
TCO2: 23 mmol/L (ref 22–32)
pCO2 arterial: 35 mmHg (ref 32–48)
pH, Arterial: 7.399 (ref 7.35–7.45)
pO2, Arterial: 53 mmHg — ABNORMAL LOW (ref 83–108)

## 2022-08-11 LAB — CBC
HCT: 38.6 % — ABNORMAL LOW (ref 39.0–52.0)
Hemoglobin: 12.8 g/dL — ABNORMAL LOW (ref 13.0–17.0)
MCH: 33.6 pg (ref 26.0–34.0)
MCHC: 33.2 g/dL (ref 30.0–36.0)
MCV: 101.3 fL — ABNORMAL HIGH (ref 80.0–100.0)
Platelets: 147 10*3/uL — ABNORMAL LOW (ref 150–400)
RBC: 3.81 MIL/uL — ABNORMAL LOW (ref 4.22–5.81)
RDW: 13.4 % (ref 11.5–15.5)
WBC: 9.2 10*3/uL (ref 4.0–10.5)
nRBC: 0 % (ref 0.0–0.2)

## 2022-08-11 LAB — HEPARIN LEVEL (UNFRACTIONATED): Heparin Unfractionated: 0.27 IU/mL — ABNORMAL LOW (ref 0.30–0.70)

## 2022-08-11 LAB — HEMOGLOBIN A1C
Hgb A1c MFr Bld: 5.1 % (ref 4.8–5.6)
Mean Plasma Glucose: 99.67 mg/dL

## 2022-08-11 SURGERY — RIGHT/LEFT HEART CATH AND CORONARY ANGIOGRAPHY
Anesthesia: LOCAL

## 2022-08-11 MED ORDER — OXYCODONE HCL 5 MG PO TABS: 5.0000 mg | ORAL_TABLET | ORAL | Status: DC | PRN

## 2022-08-11 MED ORDER — MIDAZOLAM HCL 2 MG/2ML IJ SOLN
INTRAMUSCULAR | Status: DC | PRN
  Administered 2022-08-11: 1 mg via INTRAVENOUS

## 2022-08-11 MED ORDER — HYDRALAZINE HCL 20 MG/ML IJ SOLN: 10.0000 mg | INTRAMUSCULAR | Status: AC | PRN

## 2022-08-11 MED ORDER — ASPIRIN 81 MG PO CHEW: 81.0000 mg | CHEWABLE_TABLET | Freq: Every day | ORAL | Status: DC

## 2022-08-11 MED ORDER — VERAPAMIL HCL 2.5 MG/ML IV SOLN
INTRAVENOUS | Status: DC | PRN
  Administered 2022-08-11: 10 mL via INTRA_ARTERIAL

## 2022-08-11 MED ORDER — ONDANSETRON HCL 4 MG/2ML IJ SOLN: 4.0000 mg | Freq: Four times a day (QID) | INTRAMUSCULAR | Status: DC | PRN

## 2022-08-11 MED ORDER — SODIUM CHLORIDE 0.9 % IV SOLN: 250.0000 mL | INTRAVENOUS | Status: DC | PRN

## 2022-08-11 MED ORDER — ACETAMINOPHEN 325 MG PO TABS: 650.0000 mg | ORAL_TABLET | ORAL | Status: DC | PRN

## 2022-08-11 MED ORDER — HEPARIN (PORCINE) IN NACL 1000-0.9 UT/500ML-% IV SOLN
INTRAVENOUS | Status: DC | PRN
  Administered 2022-08-11 (×2): 500 mL

## 2022-08-11 MED ORDER — FENTANYL CITRATE (PF) 100 MCG/2ML IJ SOLN
INTRAMUSCULAR | Status: DC | PRN
  Administered 2022-08-11: 25 ug via INTRAVENOUS

## 2022-08-11 MED ORDER — MIDAZOLAM HCL 2 MG/2ML IJ SOLN
INTRAMUSCULAR | Status: AC
  Filled 2022-08-11: qty 2

## 2022-08-11 MED ORDER — HEPARIN SODIUM (PORCINE) 1000 UNIT/ML IJ SOLN
INTRAMUSCULAR | Status: AC
  Filled 2022-08-11: qty 10

## 2022-08-11 MED ORDER — LABETALOL HCL 5 MG/ML IV SOLN: 10.0000 mg | INTRAVENOUS | Status: AC | PRN

## 2022-08-11 MED ORDER — SODIUM CHLORIDE 0.9 % IV SOLN: INTRAVENOUS | Status: AC

## 2022-08-11 MED ORDER — HEPARIN SODIUM (PORCINE) 1000 UNIT/ML IJ SOLN
INTRAMUSCULAR | Status: DC | PRN
  Administered 2022-08-11: 3500 [IU] via INTRAVENOUS
  Administered 2022-08-11: 2000 [IU] via INTRAVENOUS

## 2022-08-11 MED ORDER — SODIUM CHLORIDE 0.9% FLUSH
3.0000 mL | INTRAVENOUS | Status: DC | PRN
  Administered 2022-08-11: 3 mL via INTRAVENOUS

## 2022-08-11 MED ORDER — HEPARIN (PORCINE) 25000 UT/250ML-% IV SOLN
1650.0000 [IU]/h | INTRAVENOUS | Status: DC
  Administered 2022-08-11: 1200 [IU]/h via INTRAVENOUS
  Administered 2022-08-12: 1350 [IU]/h via INTRAVENOUS
  Filled 2022-08-11 (×3): qty 250

## 2022-08-11 MED ORDER — VERAPAMIL HCL 2.5 MG/ML IV SOLN
INTRAVENOUS | Status: AC
  Filled 2022-08-11: qty 2

## 2022-08-11 MED ORDER — LIDOCAINE HCL (PF) 1 % IJ SOLN
INTRAMUSCULAR | Status: DC | PRN
  Administered 2022-08-11 (×2): 2 mL via INTRADERMAL

## 2022-08-11 MED ORDER — SODIUM CHLORIDE 0.9% FLUSH
3.0000 mL | Freq: Two times a day (BID) | INTRAVENOUS | Status: DC
  Administered 2022-08-11 – 2022-08-12 (×3): 3 mL via INTRAVENOUS

## 2022-08-11 MED ORDER — IOHEXOL 350 MG/ML SOLN
INTRAVENOUS | Status: DC | PRN
  Administered 2022-08-11: 70 mL via INTRA_ARTERIAL

## 2022-08-11 MED ORDER — FENTANYL CITRATE (PF) 100 MCG/2ML IJ SOLN
INTRAMUSCULAR | Status: AC
  Filled 2022-08-11: qty 2

## 2022-08-11 SURGICAL SUPPLY — 16 items
CATH 5FR JL3.5 JR4 ANG PIG MP (CATHETERS) IMPLANT
CATH INFINITI 5 FR AR1 MOD (CATHETERS) IMPLANT
CATH INFINITI 5FR AL1 (CATHETERS) IMPLANT
CATH INFINITI 5FR MPB2 (CATHETERS) IMPLANT
CATH SWAN GANZ 7F STRAIGHT (CATHETERS) IMPLANT
DEVICE RAD COMP TR BAND LRG (VASCULAR PRODUCTS) IMPLANT
GLIDESHEATH SLEND A-KIT 6F 22G (SHEATH) IMPLANT
GLIDESHEATH SLENDER 7FR .021G (SHEATH) IMPLANT
GUIDEWIRE .025 260CM (WIRE) IMPLANT
GUIDEWIRE INQWIRE 1.5J.035X260 (WIRE) IMPLANT
INQWIRE 1.5J .035X260CM (WIRE) ×1
KIT HEART LEFT (KITS) ×1 IMPLANT
PACK CARDIAC CATHETERIZATION (CUSTOM PROCEDURE TRAY) ×1 IMPLANT
TRANSDUCER W/STOPCOCK (MISCELLANEOUS) ×1 IMPLANT
TUBING CIL FLEX 10 FLL-RA (TUBING) ×1 IMPLANT
WIRE EMERALD ST .035X150CM (WIRE) IMPLANT

## 2022-08-11 NOTE — Consult Note (Addendum)
[address]       [place] [address]             [phone]        Omario W Veley [facility] Medical Record [id number] Date of Birth: 1946/09/16  Referring: Tamala Julian Primary Care: Sharilyn Sites, MD Primary Cardiologist:None  Chief Complaint:    Chief Complaint  Patient presents with   Irregular Heart Beat    History of Present Illness:      Dalson Pizarro is a 76 yo male with no known previous medical history as he does not routinely obtain medical care.  He presented to the Emergency Department with complaint of syncope on 08/09/2022.  The patient stated he had been feeling out of sorts and under the weather for the past 3 days or so.  The patient was carrying a box of cat food that weighed about 30 lbs from his porch.  While doing this he developed complaints of heart burn, shortness of breath, and ultimately passed out.  He suffered a similar episode of syncope while walking up the stairs.  EMS was called and on arrival the patient appeared diaphoretic, pale, and unwell on arrival.  He was noted to be hypotensive.  The patients EKG had irregular tachycardia and he underwent cardioversion with improvement in his symptoms.  Workup in the ED revealed a murmur on physical exam.  EKG obtained shows NSR with ST depressions.  His initial Troponin level was elevated at 2108.  CXR showed no evidence of pneumothorax, pleural effusion.  Cardiology consult was obtained for admission.  They placed patient on a Heparin drip.  He was started on low dose BB with pre-admit SVT.  Echocardiogram obtained showed EF of 40-45% and severe aortic stenosis.  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  Cardiothoracic surgery consultation has been requested for CABG and Aortic Valve Replacement.  The patient is currently symptom free.  He denies previous medical history, however he does not routinely seek medical care.  He has dentures on his top teeth.   He has some of his own teeth on the bottom.  He does not know the last time he went to the Dentist.  He is a former smoker.  He smoked less than 1 pack per day for about 16 years when he was younger.  He was active prior to surgery.  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  He was up blowing leaves prior to his syncopal episode.  He has children that are able to help provide care.  His father had an enlarged heart.    Current Activity/ Functional Status: Patient is independent with mobility/ambulation, transfers, ADL's, IADL's.   Zubrod Score: At the time of surgery this patient's most appropriate activity status/level should be described as: []     0    Normal activity, no symptoms [x]     1    Restricted in physical strenuous activity but ambulatory, able to do out light work []     2    Ambulatory and capable of self care, unable to do work activities, up and about                 more than 50%  Of the time                            []     3  Only limited self care, in bed greater than 50% of waking hours []     4    Completely disabled, no self care, confined to bed or chair []     5    Moribund  Past Medical History:  Diagnosis Date   Cellulitis    Dyslipidemia    Hyperlipidemia 2013   LDL particle number [id number] with LDL CALULATED at 151 and 2300 in 1988 was the small LDL particle number,which is very high- not tinterested in taking medications   Systolic murmur     Past Surgical History:  Procedure Laterality Date   CATARACT EXTRACTION     DOPPLER ECHOCARDIOGRAPHY  12/21/2012   EF 55-60%,   DOPPLER ECHOCARDIOGRAPHY  12/16/2011   MILD AORTIC STENOSIS,PEAK AND MEAN GRADIENTS OF 18 AND 8 mmHg and a valve area of around 2 square cm.    RIGHT/LEFT HEART CATH AND CORONARY ANGIOGRAPHY N/A 08/11/2022   Procedure: RIGHT/LEFT HEART CATH AND CORONARY ANGIOGRAPHY;  Surgeon: Belva Crome, MD;  Location: [facility];  Service: Cardiovascular;  Laterality: N/A;    Social History    Tobacco Use  Smoking Status Former   Types: Cigarettes   Quit date: 04/15/1983   Years since quitting: 39.3  Smokeless Tobacco Never    Social History   Substance and Sexual Activity  Alcohol Use Yes   Comment: beer occasionally     Allergies  Allergen Reactions   Statins     Current Facility-Administered Medications  Medication Dose Route Frequency Provider Last Rate Last Admin   0.9 %  sodium chloride infusion   Intravenous Continuous Belva Crome, MD 75 mL/hr at 08/11/22 1015 Rate Change at 08/11/22 1015   0.9 %  sodium chloride infusion  250 mL Intravenous PRN Belva Crome, MD       acetaminophen (TYLENOL) tablet 650 mg  650 mg Oral Q4H PRN Belva Crome, MD       aspirin EC tablet 81 mg  81 mg Oral Daily Belva Crome, MD   81 mg at 08/11/22 [id number]   heparin ADULT infusion 100 units/mL (25000 units/261mL)  1,200 Units/hr Intravenous Continuous Lyndee Leo, RPH       hydrALAZINE (APRESOLINE) injection 10 mg  10 mg Intravenous Q20 Min PRN Belva Crome, MD       labetalol (NORMODYNE) injection 10 mg  10 mg Intravenous Q10 min PRN Belva Crome, MD       metoprolol tartrate (LOPRESSOR) tablet 12.5 mg  12.5 mg Oral BID Belva Crome, MD   12.5 mg at 08/11/22 1100   nitroGLYCERIN (NITROSTAT) SL tablet 0.4 mg  0.4 mg Sublingual Q5 Min x 3 PRN Belva Crome, MD       ondansetron [facility]) injection 4 mg  4 mg Intravenous Q6H PRN Belva Crome, MD       oxyCODONE (Oxy IR/ROXICODONE) immediate release tablet 5-10 mg  5-10 mg Oral Q4H PRN Belva Crome, MD       sodium chloride flush (NS) 0.9 % injection 3 mL  3 mL Intravenous Q12H Belva Crome, MD   3 mL at 08/11/22 1232   sodium chloride flush (NS) 0.9 % injection 3 mL  3 mL Intravenous PRN Belva Crome, MD   3 mL at 08/11/22 1231    Medications Prior to Admission  Medication Sig Dispense Refill Last Dose   aspirin EC 325 MG tablet Take 325 mg by mouth once.   08/09/2022  aspirin EC 81 MG EC tablet Take 1  tablet (81 mg total) by mouth daily. 60 tablet 0 08/09/2022   hydrochlorothiazide (HYDRODIURIL) 25 MG tablet Take 1 tablet (25 mg total) by mouth daily. (Patient not taking: Reported on 08/09/2022) 30 tablet 0 More than a month    Family History  Problem Relation Age of Onset   Diabetes Mother    Heart failure Father    Review of Systems:   ROS    Cardiac Review of Systems: Y or  [    ]= no  Chest Pain [ Y   ]  Resting SOB [ N  ] Exertional SOB  [Y  ]  Orthopnea [  ]   Pedal Edema [   ]    Palpitations [Y  ] Syncope  [ Y ]   Presyncope [   ]   General Review of Systems: [Y] = yes [  ]=no Constitional: recent weight change [Y, without trying.. lack of appetite  ]; anorexia [  ]; fatigue Klaus.Mock  ]; nausea [ N ]; night sweats [  ]; fever [  ]; or chills [  ]                                                               Dental: Last Dentist visit: unknown, dentures on top, several teeth remaining on botton.. no evidence of necrosis, dental caries  Eye : blurred vision [  ]; diplopia [   ]; vision changes Klaus.Mock  ];  Amaurosis fugax[  ]; Resp: cough [  ];  wheezing[  ];  hemoptysis[  ]; shortness of breath[  ]; paroxysmal nocturnal dyspnea[  ]; dyspnea on exertion[Y  ]; or orthopnea[  ];  GI:  gallstones[  ], vomiting[ N ];  dysphagia[  ]; melena[  ];  hematochezia [  ]; heartburn[  ];   Hx of  Colonoscopy[  ]; GU: kidney stones [  ]; hematuria[  ];   dysuria [  ];  nocturia[  ];  history of     obstruction [  ]; urinary frequency [  ]             Skin: rash, swelling[N  ];, hair loss[  ];  peripheral edema[Y  ];  or itching[  ]; Musculosketetal: myalgias[  ];  joint swelling[  ];  joint erythema[  ];  joint pain[  ];  back pain[  ];  Heme/Lymph: bruising[  ];  bleeding[  ];  anemia[  ];  Neuro: TIA[  ];  headaches[ N ];  stroke[N  ];  vertigo[  ];  seizures[  ];   paresthesias[  ];  difficulty walking[N  ];  Psych:depression[  ]; anxiety[  ];  Endocrine: diabetes[ N ];  thyroid dysfunction[   ];  Physical Exam: BP 123/77   Pulse 81   Temp 98.5 F (36.9 C) (Oral)   Resp 20   Ht 5\' 10"  (1.778 m)   Wt 69.2 kg   SpO2 97%   BMI 21.88 kg/m   General appearance: alert, cooperative, and no distress Head: Normocephalic, without obvious abnormality, atraumatic Neck: no adenopathy, no JVD, supple, symmetrical, trachea midline, thyroid not enlarged, symmetric, no tenderness/mass/nodules, and + carotid bruit likely due to AS Resp: clear  to auscultation bilaterally Cardio: regular rate and rhythm and +III/IV systolic murmur GI: soft, non-tender; bowel sounds normal; no masses,  no organomegaly Extremities: extremities normal, atraumatic, no cyanosis or edema Neurologic: Grossly normal  Diagnostic Studies & Laboratory data:     Recent Radiology Findings:   CARDIAC CATHETERIZATION  Addendum Date: 08/11/2022   CONCLUSIONS: Severe calcific aortic stenosis with calculated aortic valve area less than 0.75 cm. 95% proximal to mid LAD followed by 70% further down the vessel.  The proximal lesion is within a calcified segment. 70 to 80% mid nondominant RCA which supplies faint collaterals to the LAD. Mild pulmonary hypertension.  Capillary wedge pressure 15 mmHg. RECOMMENDATIONS: Surgical aortic valve replacement and LIMA to LAD and possibly the acute marginal branch of the RCA.  Result Date: 08/11/2022   Mid LAD lesion is 70% stenosed.   Prox LAD to Mid LAD lesion is 95% stenosed.   1st Sept lesion is 70% stenosed.   Prox RCA lesion is 75% stenosed.   There is mild left ventricular systolic dysfunction.   LV end diastolic pressure is normal.   LV end diastolic pressure is normal.   The left ventricular ejection fraction is 45-50% by visual estimate.   Hemodynamic findings consistent with mild pulmonary hypertension and aortic valve stenosis.   There is severe aortic valve stenosis.   ECHOCARDIOGRAM COMPLETE  Result Date: 08/10/2022    ECHOCARDIOGRAM REPORT   Patient Name:   SEANN MANELLA  Date of Exam: 08/10/2022 Medical Rec #:  [id number]      Height:       70.0 in Accession #:    [id number]     Weight:       155.4 lb Date of Birth:  1945-12-10      BSA:          1.875 m Patient Age:    36 years       BP:           155/87 mmHg Patient Gender: M              HR:           84 bpm. Exam Location:  Inpatient Procedure: 2D Echo, Color Doppler and Cardiac Doppler Indications:    NSTEMI i21.4  History:        Patient has prior history of Echocardiogram examinations, most                 recent 11/25/2018. Risk Factors:Dyslipidemia.  Sonographer:    Raquel Sarna Senior RDCS Referring Phys: [id number] [place]  1. Left ventricular ejection fraction, by estimation, is 40 to 45%. The left ventricle has mildly decreased function. The left ventricle demonstrates global hypokinesis. Left ventricular diastolic parameters are consistent with Grade III diastolic dysfunction (restrictive). Elevated left ventricular end-diastolic pressure.  2. Right ventricular systolic function is normal. The right ventricular size is normal. There is mildly elevated pulmonary artery systolic pressure. The estimated right ventricular systolic pressure is 123XX123 mmHg.  3. Left atrial size was mildly dilated.  4. The mitral valve is degenerative. Mild mitral valve regurgitation. No evidence of mitral stenosis. The mean mitral valve gradient is 3.0 mmHg.  5. The aortic valve has an indeterminant number of cusps. There is severe calcifcation of the aortic valve. There is severe thickening of the aortic valve. Aortic valve regurgitation is not visualized. Severe aortic valve stenosis. Aortic valve area, by  VTI measures 0.53 cm. Aortic valve mean gradient measures 45.0 mmHg. Aortic  valve Vmax measures 4.10 m/s.  6. The inferior vena cava is dilated in size with <50% respiratory variability, suggesting right atrial pressure of 15 mmHg. FINDINGS  Left Ventricle: Left ventricular ejection fraction, by estimation, is 40 to 45%. The  left ventricle has mildly decreased function. The left ventricle demonstrates global hypokinesis. The left ventricular internal cavity size was normal in size. There is  no left ventricular hypertrophy. Left ventricular diastolic parameters are consistent with Grade III diastolic dysfunction (restrictive). Elevated left ventricular end-diastolic pressure. Right Ventricle: The right ventricular size is normal. No increase in right ventricular wall thickness. Right ventricular systolic function is normal. There is mildly elevated pulmonary artery systolic pressure. The tricuspid regurgitant velocity is 2.36  m/s, and with an assumed right atrial pressure of 15 mmHg, the estimated right ventricular systolic pressure is 123XX123 mmHg. Left Atrium: Left atrial size was mildly dilated. Right Atrium: Right atrial size was normal in size. Pericardium: There is no evidence of pericardial effusion. Mitral Valve: The mitral valve is degenerative in appearance. There is moderate thickening of the mitral valve leaflet(s). There is mild calcification of the mitral valve leaflet(s). Mild to moderate mitral annular calcification. Mild mitral valve regurgitation. No evidence of mitral valve stenosis. MV peak gradient, 9.5 mmHg. The mean mitral valve gradient is 3.0 mmHg. Tricuspid Valve: The tricuspid valve is normal in structure. Tricuspid valve regurgitation is not demonstrated. No evidence of tricuspid stenosis. Aortic Valve: The aortic valve has an indeterminant number of cusps. There is severe calcifcation of the aortic valve. There is severe thickening of the aortic valve. Aortic valve regurgitation is not visualized. Severe aortic stenosis is present. Aortic  valve mean gradient measures 45.0 mmHg. Aortic valve peak gradient measures 67.2 mmHg. Aortic valve area, by VTI measures 0.53 cm. Pulmonic Valve: The pulmonic valve was normal in structure. Pulmonic valve regurgitation is not visualized. No evidence of pulmonic stenosis.  Aorta: The aortic root is normal in size and structure. Venous: The inferior vena cava is dilated in size with less than 50% respiratory variability, suggesting right atrial pressure of 15 mmHg. IAS/Shunts: No atrial level shunt detected by color flow Doppler.  LEFT VENTRICLE PLAX 2D LVIDd:         4.75 cm      Diastology LVIDs:         3.60 cm      LV e' medial:    4.57 cm/s LV PW:         0.95 cm      LV E/e' medial:  30.6 LV IVS:        0.90 cm      LV e' lateral:   5.22 cm/s LVOT diam:     1.95 cm      LV E/e' lateral: 26.8 LV SV:         48 LV SV Index:   26 LVOT Area:     2.99 cm  LV Volumes (MOD) LV vol d, MOD A2C: 140.0 ml LV vol d, MOD A4C: 102.0 ml LV vol s, MOD A2C: 73.1 ml LV vol s, MOD A4C: 60.1 ml LV SV MOD A2C:     66.9 ml LV SV MOD A4C:     102.0 ml LV SV MOD BP:      53.5 ml RIGHT VENTRICLE RV S prime:     10.80 cm/s TAPSE (M-mode): 1.8 cm LEFT ATRIUM             Index  RIGHT ATRIUM           Index LA diam:        4.05 cm 2.16 cm/m   RA Area:     17.20 cm LA Vol (A2C):   56.0 ml 29.87 ml/m  RA Volume:   48.70 ml  25.97 ml/m LA Vol (A4C):   65.9 ml 35.15 ml/m LA Biplane Vol: 64.9 ml 34.61 ml/m  AORTIC VALVE AV Area (Vmax):    0.53 cm AV Area (Vmean):   0.51 cm AV Area (VTI):     0.53 cm AV Vmax:           410.00 cm/s AV Vmean:          322.000 cm/s AV VTI:            0.911 m AV Peak Grad:      67.2 mmHg AV Mean Grad:      45.0 mmHg LVOT Vmax:         72.30 cm/s LVOT Vmean:        55.000 cm/s LVOT VTI:          0.162 m LVOT/AV VTI ratio: 0.18  AORTA Ao Root diam: 3.05 cm MITRAL VALVE                TRICUSPID VALVE MV Area (PHT): 4.68 cm     TR Peak grad:   22.3 mmHg MV Area VTI:   2.14 cm     TR Vmax:        236.00 cm/s MV Peak grad:  9.5 mmHg MV Mean grad:  3.0 mmHg     SHUNTS MV Vmax:       1.54 m/s     Systemic VTI:  0.16 m MV Vmean:      81.8 cm/s    Systemic Diam: 1.95 cm MV Decel Time: 162 msec MV E velocity: 140.00 cm/s MV A velocity: 53.40 cm/s MV E/A ratio:  2.62 Fransico Him MD Electronically signed by Fransico Him MD Signature Date/Time: 08/10/2022/6:05:45 PM    Final    DG Chest Portable 1 View  Result Date: 08/09/2022 CLINICAL DATA:  Chest pain and syncope EXAM: PORTABLE CHEST 1 VIEW COMPARISON:  Chest x-ray 11/25/2018 FINDINGS: The heart size and mediastinal contours are within normal limits. Both lungs are clear. The visualized skeletal structures are unremarkable. IMPRESSION: No active disease. Electronically Signed   By: Ronney Asters M.D.   On: 08/09/2022 20:04     I have independently reviewed the above radiologic studies and discussed with the patient   Recent Lab Findings: Lab Results  Component Value Date   WBC 9.2 08/11/2022   HGB 12.8 (L) 08/11/2022   HCT 38.6 (L) 08/11/2022   PLT 147 (L) 08/11/2022   GLUCOSE 147 (H) 08/11/2022   CHOL 215 (H) 08/10/2022   TRIG 49 08/10/2022   HDL 45 08/10/2022   LDLCALC 160 (H) 08/10/2022   ALT 19 08/09/2022   AST 40 08/09/2022   NA 141 08/11/2022   K 3.8 08/11/2022   CL 109 08/11/2022   CREATININE 1.11 08/11/2022   BUN 24 (H) 08/11/2022   CO2 23 08/11/2022   TSH 2.157 08/09/2022   INR 1.1 08/09/2022   HGBA1C 5.1 08/10/2022    Assessment / Plan:      Severe Aortic Stenosis- patient with syncopal episode on presentation CAD- single vessel LAD HLD- intolerant of statins, referral to Lipid clinic at discharge for possible PKSC9 Inhibitor Weight loss of 90 lbs-  per patient was told this is normal, ? Cause? However doesn't appear malnourished Dispo- patient stable, has no symptoms currently.. he is agreeable to proceed with surgery.  Have ordered CT of chest to evaluate Aorta.  His remaining teeth appear free of dental caries/infection.. no further dental clearance indicated.  Dr. Lavonna Monarch to evaluate for CABG/AVR this Wednesday.  I  spent 60 minutes counseling the patient face to face.   Ellwood Handler, PA-C 08/11/2022 12:36 PM  I have reviewed the above and agree with the findings.  A very  healthy 76 yo wm with syncope and SVT found to have severe AS and CAD. Pt has been in NSR and echo with normal LV function and severe AS with a mean gradient of 26mmHg and cath with LAD and RCA stenosis. We are awaiting the noncontrast CT scan of chest and if no significant calcification, we will offer SAVR/CAB instead of TAVR/PCI. I have discussed with the pt and with the daughter present the risks and goals of surgery and the expected recovery and restrictions that will follow. He understands and wishes to proceed.

## 2022-08-11 NOTE — Progress Notes (Signed)
ANTICOAGULATION CONSULT NOTE  Pharmacy Consult for Heparin Indication: chest pain/ACS   Allergies  Allergen Reactions   Statins     Patient Measurements: Height: 5\' 10"  (177.8 cm) Weight: 69.2 kg (152 lb 8 oz) IBW/kg (Calculated) : 73 Heparin Dosing Weight: 70.5 kg  Vital Signs: Temp: 98.5 F (36.9 C) (11/20 0353) Temp Source: Oral (11/20 0353) BP: 92/50 (11/20 0927) Pulse Rate: 74 (11/20 0927)  Labs: Recent Labs    08/09/22 1841 08/09/22 2047 08/10/22 0426 08/10/22 1231 08/10/22 2136 08/11/22 0047  HGB 12.0*  --   --   --   --  12.8*  HCT 34.9*  --   --   --   --  38.6*  PLT 149*  --   --   --   --  147*  APTT 28  --   --   --   --   --   LABPROT 13.7  --   --   --   --   --   INR 1.1  --   --   --   --   --   HEPARINUNFRC  --   --    < > 0.22* 0.32 0.27*  CREATININE 1.12  --   --   --   --  1.11  TROPONINIHS 2,108* 2,044*  --   --   --   --    < > = values in this interval not displayed.     Estimated Creatinine Clearance: 53.7 mL/min (by C-G formula based on SCr of 1.11 mg/dL).  Assessment: 76 year old male admitted for nstemi workup. 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 Patient being evaluated for surgical aortic valve replacement and bypass grafts. Orders to resume heparin this afternoon.   Goal of Therapy:  Heparin level 0.3-0.7 units/ml Monitor platelets by anticoagulation protocol: Yes   Plan:  Restart heparin at 1200 units/hr this afternoon Heparin level in am  70 PharmD., BCPS Clinical Pharmacist 08/11/2022 9:30 AM

## 2022-08-11 NOTE — Progress Notes (Addendum)
   Heart Failure Stewardship Pharmacist Progress Note   PCP: Assunta Found, MD PCP-Cardiologist: None    HPI:  Jonathan Romero is a 76 year old male with a PMH of HLD and mild aortic stenosis who presented with sudden onset of heartburn on the left side of his chest with shortness of breath, diaphoresis, then syncope. Patient initially refused EMS, however when he had a second episode in the afternoon associated with left arm numbness EMS arrived to find him in SVT in the 280s. He was cardioverted to NSR. Troponin was elevated on admission and the patient was diagnosed with NSTEMI. Echocardiogram on 08/10/22 showed LVEF 40-45% with grade III diastolic dysfunction. Underwent LHC 08/11/22 that showed severe aortic stenosis, 95% proximal to mid LAD, 70% distal LAD, 70-80% mid RCA, mild PH, and LVEDP of 20. RHC showed CO of 4.73, CI of 2.55, PW of 27, PA of 27, RAP of 7, and PAPi of 3.85. Surgery was consulted to evaluate for AVR and CABG.   Current HF Medications: Beta Blocker: metoprolol tartrate 12.5 mg BID  Prior to admission HF Medications: None  Pertinent Lab Values: Serum creatinine 1.11, BUN 24, Potassium 3.8, Sodium 141, BNP 486, Magnesium 2, A1c IP  Vital Signs: Weight: 152.5 lbs (admission weight: 155.4 lbs) Blood pressure: 100-150-70-80s, SBP 90s after cath  Heart rate: 80-90s  I/O: +0.1L yesterday; net -0.4L  Medication Assistance / Insurance Benefits Check: Does the patient have prescription insurance?  yes Type of insurance plan: Medicare  Does the patient qualify for medication assistance through manufacturers or grants?   Pending Eligible grants and/or patient assistance programs: pending Medication assistance applications in progress: pending  Medication assistance applications approved: pending Approved medication assistance renewals will be completed by: pending  Outpatient Pharmacy:  Prior to admission outpatient pharmacy: [facility] Is the patient willing to use [facility]  TOC pharmacy at discharge? Pending Is the patient willing to transition their outpatient pharmacy to utilize a [facility] outpatient pharmacy?   Pending    Assessment: 1. 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 NYHA class I-II symptoms. - Maintain strict I/Os, daily weights, Mg >2, and K >4.  - Patient hypotensive after cath, expect BP will improve this afternoon - Euvolemic on exam with no shortness of breath or peripheral edema. - Continue metoprolol during surgical work up for now, if patient becomes symptomatic, can hold BB given severe AS. - Will consider GDMT pending surgical plans.    Plan: 1) Medication changes recommended at this time: - None  2) Patient assistance: - Pending  3)  Education  - To be completed prior to discharge  Thank you for allowing pharmacy to participate in this patient's care.  Enos Fling, PharmD PGY2 Pharmacy Resident 08/11/2022 7:50 AM Check [email or web address] for unit specific pharmacy number

## 2022-08-11 NOTE — Progress Notes (Signed)
Heart Failure Navigator Progress Note  Following this hospitalization to assess for HV TOC readiness.   EF 40-45% Right / Left heart cath 08/11/22: Recommend per MD- Surgery for Aortic Valve replacement with LIMA to LAD.   Rhae Hammock, BSN, Scientist, clinical (histocompatibility and immunogenetics) Only

## 2022-08-11 NOTE — CV Procedure (Signed)
Severe aortic valve stenosis with peak to peak gradient 67 mmHg and mean gradient 53 mmHg.  Calculated aortic valve area 0.68 cm. Calcified proximal to mid 95% LAD stenosis followed by a 70% LAD stenosis for the distal. Nondominant right coronary with 85% mid stenosis.  Acute marginal branch of RCA supplies collaterals to the mid LAD. Echo LVEF 40 to 45%. Right heart pressures demonstrate moderate pulmonary hypertension, pulmonary wedge pressure 15 mmHg, mixed venous O2 saturation 57%.  Recommend heart team approach with likely treatment being surgical aortic valve replacement with LIMA to LAD.

## 2022-08-11 NOTE — Progress Notes (Signed)
VASCULAR LAB    Pre CABG Dopplers have been performed.  See CV proc for preliminary results.   Tremain Rucinski, RVT 08/11/2022, 2:07 PM

## 2022-08-11 NOTE — Interval H&P Note (Signed)
Cath Lab Visit (complete for each Cath Lab visit)  Clinical Evaluation Leading to the Procedure:   ACS: Yes.    Non-ACS:    Anginal Classification: CCS III  Anti-ischemic medical therapy: Minimal Therapy (1 class of medications)  Non-Invasive Test Results: No non-invasive testing performed  Prior CABG: No previous CABG      History and Physical Interval Note:  08/11/2022 7:44 AM  Jonathan Romero  has presented today for surgery, with the diagnosis of NSTEMI.  The various methods of treatment have been discussed with the patient and family. After consideration of risks, benefits and other options for treatment, the patient has consented to  Procedure(s): RIGHT/LEFT HEART CATH AND CORONARY ANGIOGRAPHY (N/A) as a surgical intervention.  The patient's history has been reviewed, patient examined, no change in status, stable for surgery.  I have reviewed the patient's chart and labs.  Questions were answered to the patient's satisfaction.     Lyn Records III

## 2022-08-12 ENCOUNTER — Inpatient Hospital Stay (HOSPITAL_COMMUNITY): Payer: Medicare Other

## 2022-08-12 DIAGNOSIS — R7989 Other specified abnormal findings of blood chemistry: Secondary | ICD-10-CM

## 2022-08-12 DIAGNOSIS — R55 Syncope and collapse: Secondary | ICD-10-CM

## 2022-08-12 LAB — CBC
HCT: 33.9 % — ABNORMAL LOW (ref 39.0–52.0)
Hemoglobin: 11.6 g/dL — ABNORMAL LOW (ref 13.0–17.0)
MCH: 34.5 pg — ABNORMAL HIGH (ref 26.0–34.0)
MCHC: 34.2 g/dL (ref 30.0–36.0)
MCV: 100.9 fL — ABNORMAL HIGH (ref 80.0–100.0)
Platelets: 127 10*3/uL — ABNORMAL LOW (ref 150–400)
RBC: 3.36 MIL/uL — ABNORMAL LOW (ref 4.22–5.81)
RDW: 13.6 % (ref 11.5–15.5)
WBC: 10.3 10*3/uL (ref 4.0–10.5)
nRBC: 0 % (ref 0.0–0.2)

## 2022-08-12 LAB — HEPARIN LEVEL (UNFRACTIONATED)
Heparin Unfractionated: <0.1 IU/mL — ABNORMAL LOW (ref 0.30–0.70)
Heparin Unfractionated: 0.13 IU/mL — ABNORMAL LOW (ref 0.30–0.70)
Heparin Unfractionated: 0.26 IU/mL — ABNORMAL LOW (ref 0.30–0.70)

## 2022-08-12 LAB — BASIC METABOLIC PANEL
Anion gap: 9 (ref 5–15)
BUN: 18 mg/dL (ref 8–23)
CO2: 21 mmol/L — ABNORMAL LOW (ref 22–32)
Calcium: 8.1 mg/dL — ABNORMAL LOW (ref 8.9–10.3)
Chloride: 108 mmol/L (ref 98–111)
Creatinine, Ser: 0.9 mg/dL (ref 0.61–1.24)
GFR, Estimated: >60 mL/min (ref >60)
Glucose, Bld: 106 mg/dL — ABNORMAL HIGH (ref 70–99)
Potassium: 4 mmol/L (ref 3.5–5.1)
Sodium: 138 mmol/L (ref 135–145)

## 2022-08-12 LAB — LIPOPROTEIN A (LPA): Lipoprotein (a): 41.6 nmol/L — ABNORMAL HIGH (ref <75.0)

## 2022-08-12 MED ORDER — IOHEXOL 350 MG/ML SOLN
95.0000 mL | Freq: Once | INTRAVENOUS | Status: AC | PRN
  Administered 2022-08-12: 95 mL via INTRAVENOUS

## 2022-08-12 MED ORDER — CLOPIDOGREL BISULFATE 75 MG PO TABS
300.0000 mg | ORAL_TABLET | Freq: Every day | ORAL | Status: DC
  Administered 2022-08-12: 300 mg via ORAL
  Filled 2022-08-12: qty 4

## 2022-08-12 MED ORDER — SODIUM CHLORIDE 0.9% FLUSH
3.0000 mL | Freq: Two times a day (BID) | INTRAVENOUS | Status: DC
  Administered 2022-08-12 – 2022-08-14 (×4): 3 mL via INTRAVENOUS

## 2022-08-12 MED ORDER — METOPROLOL SUCCINATE ER 25 MG PO TB24
25.0000 mg | ORAL_TABLET | Freq: Every day | ORAL | Status: DC
  Administered 2022-08-13 – 2022-08-14 (×2): 25 mg via ORAL
  Filled 2022-08-12 (×2): qty 1

## 2022-08-12 MED ORDER — CLOPIDOGREL BISULFATE 75 MG PO TABS
75.0000 mg | ORAL_TABLET | Freq: Every day | ORAL | Status: DC
  Administered 2022-08-13 – 2022-08-14 (×2): 75 mg via ORAL
  Filled 2022-08-12 (×2): qty 1

## 2022-08-12 NOTE — Progress Notes (Signed)
ANTICOAGULATION CONSULT NOTE  Pharmacy Consult for Heparin Indication: chest pain/ACS Brief A/P: Heparin level subtherapeutic Increase Heparin rate   Allergies  Allergen Reactions   Statins     Patient Measurements: Height: 5\' 10"  (177.8 cm) Weight: (P) 70.9 kg (156 lb 6.4 oz) IBW/kg (Calculated) : 73 Heparin Dosing Weight: 70.5 kg  Vital Signs: Temp: 98.3 F (36.8 C) (11/21 1918) Temp Source: Oral (11/21 1918) BP: 109/79 (11/21 1918) Pulse Rate: 75 (11/21 1918)  Labs: Recent Labs    08/11/22 0047 08/11/22 0813 08/11/22 0814 08/11/22 0824 08/12/22 0043 08/12/22 1039 08/12/22 2148  HGB 12.8*   < > 12.6* 11.9* 11.6*  --   --   HCT 38.6*   < > 37.0* 35.0* 33.9*  --   --   PLT 147*  --   --   --  127*  --   --   HEPARINUNFRC 0.27*  --   --   --  <0.10* 0.13* 0.26*  CREATININE 1.11  --   --   --  0.90  --   --    < > = values in this interval not displayed.     Estimated Creatinine Clearance: 66.2 mL/min (by C-G formula based on SCr of 0.9 mg/dL).  Assessment: 76 year old male with NSTEMI awaiting PCI for heparin  Goal of Therapy:  Heparin level 0.3-0.7 units/ml Monitor platelets by anticoagulation protocol: Yes   Plan:  Increase Heparin 1650 units/hr  70, PharmD, BCPS

## 2022-08-12 NOTE — TOC Progression Note (Signed)
Transition of Care [facility]) - Progression Note    Patient Details  Name: Jonathan Romero MRN: [id number] Date of Birth: 02-01-1946  Transition of Care [facility]) CM/SW Contact  Leone Haven, RN Phone Number: 08/12/2022, 10:12 AM  Clinical Narrative:    rom home with wife, hypotensive, 3 vessel dz, severe AS, CVT consulted, CABG and TAVR tomorrow, conts on hep drip.  TOC following.         Expected Discharge Plan and Services                                                 Social Determinants of Health (SDOH) Interventions    Readmission Risk Interventions     No data to display

## 2022-08-12 NOTE — Progress Notes (Signed)
I appreciate Dr Karolee Ohs input. CT of aorta shows significant calcification making open heart surgery higher risk. Agree with TAVR approach. I did review cath films again. 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 This is followed by a 70% lesion in the mid vessel. I do believe PCI of the LAD is indicated and should be done prior to TAVR. I discussed with Dr Leafy Ro and Dr Excell Seltzer who agree. Will load with Plavix today.  I discussed all this with the patient, his son and daughter. Procedure described in detail. Possible modalities discussed including atherectomy, lithotripsy, imaging and stents. The procedure and risks were reviewed including but not limited to death, myocardial infarction, stroke, arrythmias, bleeding, transfusion, emergency surgery, dye allergy, or renal dysfunction. The patient voices understanding and is agreeable to proceed.   Will schedule for tomorrow afternoon with Dr Excell Seltzer.  Maddox Bratcher [place] MD, [facility] 2:33 PM

## 2022-08-12 NOTE — Progress Notes (Addendum)
   Heart Failure Stewardship Pharmacist Progress Note   PCP: Assunta Found, MD PCP-Cardiologist: None    HPI:  Jonathan Romero is a 76 year old male with a PMH of HLD and mild aortic stenosis who presented with sudden onset of heartburn on the left side of his chest with shortness of breath, diaphoresis, then syncope. Patient initially refused EMS, however when he had a second episode in the afternoon associated with left arm numbness EMS arrived to find him in SVT in the 280s. He was cardioverted to NSR. Troponin was elevated on admission and the patient was diagnosed with NSTEMI. Echocardiogram on 08/10/22 showed LVEF 40-45% with grade III diastolic dysfunction. Underwent LHC 08/11/22 that showed severe aortic stenosis, 95% proximal to mid LAD, 70% distal LAD, 70-80% mid RCA, mild PH, and LVEDP of 20. RHC showed CO of 4.73, CI of 2.55, PW of 27, PA of 27, RAP of 7, and PAPi of 3.85. Surgery was consulted and is planning for AVR and CABG tomorrow.   Current HF Medications: Beta Blocker: metoprolol tartrate 12.5 mg BID  Prior to admission HF Medications: None  Pertinent Lab Values: Serum creatinine 0.9, BUN 18, Potassium 4, Sodium 138, BNP 486, Magnesium 2, A1c 5.1  Vital Signs: Weight: none today (admission weight: 155.4 lbs) Blood pressure: 90-110/60s  Heart rate: 80-90s  I/O: not charted  Medication Assistance / Insurance Benefits Check: Does the patient have prescription insurance?  yes Type of insurance plan: Medicare  Does the patient qualify for medication assistance through manufacturers or grants?   Pending Eligible grants and/or patient assistance programs: pending Medication assistance applications in progress: pending  Medication assistance applications approved: pending Approved medication assistance renewals will be completed by: pending  Outpatient Pharmacy:  Prior to admission outpatient pharmacy: [facility] Is the patient willing to use [facility] TOC pharmacy at discharge?  Pending Is the patient willing to transition their outpatient pharmacy to utilize a [facility] outpatient pharmacy?   Pending    Assessment: 1. 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 NYHA class I-II symptoms. - Maintain strict I/Os, daily weights, Mg >2, and K >4.  - Patient hypotensive after cath, expect BP will improve this afternoon - Euvolemic on exam with no shortness of breath or peripheral edema. - Continue metoprolol during surgical work up for now, if patient becomes symptomatic, can hold BB given severe AS. - BP is low but stable. Creatnine is WNL. Will consider GDMT when stable after surgery.   Plan: 1) Medication changes recommended at this time: - None, will follow up when hemodynamically stable after surgery  2) Patient assistance: - Pending  3)  Education  - Initial education done, to be completed prior to discharge  Thank you for allowing pharmacy to participate in this patient's care.  Enos Fling, PharmD PGY2 Pharmacy Resident 08/12/2022 8:50 AM Check [email or web address] for unit specific pharmacy number

## 2022-08-12 NOTE — Progress Notes (Signed)
Rounding Note    Patient Name: Jonathan Romero Date of Encounter: 08/12/2022  [place] Cardiologist: None   Subjective   Feeling well this morning. No chest pain. Family at the bedside.   Inpatient Medications    Scheduled Meds:  aspirin EC  81 mg Oral Daily   metoprolol tartrate  12.5 mg Oral BID   sodium chloride flush  3 mL Intravenous Q12H   Continuous Infusions:  sodium chloride Stopped (08/11/22 1319)   heparin 1,200 Units/hr (08/12/22 0412)   PRN Meds: sodium chloride, acetaminophen, nitroGLYCERIN, ondansetron (ZOFRAN) IV, oxyCODONE, sodium chloride flush   Vital Signs    Vitals:   08/11/22 1932 08/12/22 0010 08/12/22 0535 08/12/22 0727  BP: (!) 91/58 109/69 113/69 110/66  Pulse: 77 74 80 74  Resp: 18 18 (P) 18 18  Temp: 97.6 F (36.4 C) 98 F (36.7 C) (P) 98.4 F (36.9 C) 98.7 F (37.1 C)  TempSrc:  Oral (P) Oral Oral  SpO2: 96% 96% 96% 96%  Weight:   (P) 70.9 kg   Height:        Intake/Output Summary (Last 24 hours) at 08/12/2022 0935 Last data filed at 08/12/2022 0759 Gross per 24 hour  Intake 1729.31 ml  Output --  Net 1729.31 ml      08/12/2022    5:35 AM 08/11/2022   12:05 AM 08/09/2022    9:52 PM  Last 3 Weights  Weight (lbs) 156 lb 6.4 oz 152 lb 8 oz 155 lb 6.4 oz  Weight (kg) 70.943 kg 69.174 kg 70.489 kg      Telemetry    Sinus Rhythm - Personally Reviewed  ECG    No new tracing  Physical Exam   GEN: No acute distress.   Neck: No JVD Cardiac: RRR, 4/6 systolic murmur RUSB, rubs, or gallops.  Respiratory: Clear to auscultation bilaterally. GI: Soft, nontender, non-distended  MS: No edema; No deformity. Right radial cath site stable.  Neuro:  Nonfocal  Psych: Normal affect   Labs    High Sensitivity Troponin:   Recent Labs  Lab 08/09/22 1841 08/09/22 2047  TROPONINIHS 2,108* 2,044*     Chemistry Recent Labs  Lab 08/09/22 1841 08/11/22 0047 08/11/22 0813 08/11/22 0814 08/11/22 0824  08/12/22 0043  NA 139 141   < > 142 140 138  K 4.6 3.8   < > 3.7 3.8 4.0  CL 106 109  --   --   --  108  CO2 22 23  --   --   --  21*  GLUCOSE 148* 147*  --   --   --  106*  BUN 25* 24*  --   --   --  18  CREATININE 1.12 1.11  --   --   --  0.90  CALCIUM 8.5* 8.5*  --   --   --  8.1*  MG 2.0  --   --   --   --   --   PROT 5.7*  --   --   --   --   --   ALBUMIN 3.0*  --   --   --   --   --   AST 40  --   --   --   --   --   ALT 19  --   --   --   --   --   ALKPHOS 75  --   --   --   --   --  BILITOT 1.6*  --   --   --   --   --   GFRNONAA >60 >60  --   --   --  >60  ANIONGAP 11 9  --   --   --  9   < > = values in this interval not displayed.    Lipids  Recent Labs  Lab 08/10/22 2136  CHOL 215*  TRIG 49  HDL 45  LDLCALC 160*  CHOLHDL 4.8    Hematology Recent Labs  Lab 08/09/22 1841 08/11/22 0047 08/11/22 0813 08/11/22 0814 08/11/22 0824 08/12/22 0043  WBC 6.6 9.2  --   --   --  10.3  RBC 3.48* 3.81*  --   --   --  3.36*  HGB 12.0* 12.8*   < > 12.6* 11.9* 11.6*  HCT 34.9* 38.6*   < > 37.0* 35.0* 33.9*  MCV 100.3* 101.3*  --   --   --  100.9*  MCH 34.5* 33.6  --   --   --  34.5*  MCHC 34.4 33.2  --   --   --  34.2  RDW 13.2 13.4  --   --   --  13.6  PLT 149* 147*  --   --   --  127*   < > = values in this interval not displayed.   Thyroid  Recent Labs  Lab 08/09/22 1841 08/09/22 1843  TSH  --  2.157  FREET4 0.95  --     BNP Recent Labs  Lab 08/09/22 1843  BNP 486.3*    DDimer No results for input(s): "DDIMER" in the last 168 hours.   Radiology    CT CHEST WO CONTRAST  Result Date: 08/11/2022 CLINICAL DATA:  Aortic atherosclerosis, aortic stenosis, pre preop aortic assessment. EXAM: CT CHEST WITHOUT CONTRAST TECHNIQUE: Multidetector CT imaging of the chest was performed following the standard protocol without IV contrast. RADIATION DOSE REDUCTION: This exam was performed according to the departmental dose-optimization program which includes  automated exposure control, adjustment of the mA and/or kV according to patient size and/or use of iterative reconstruction technique. COMPARISON:  None Available. FINDINGS: Cardiovascular: The heart is normal in size and there is no significant pericardial effusion. Three-vessel coronary artery calcifications are noted. There is atherosclerotic calcification of the aorta and aortic valve annulus. The ascending aorta measures 3.6 cm in diameter, the aortic arch measures 2.9 cm in the descending aorta measures 2.5 cm. The pulmonary trunk is normal in caliber. Mediastinum/Nodes: Calcified lymph nodes are noted in the mediastinum and right hilum. Few prominent lymph nodes are noted in the mediastinum measuring up to 1 cm in the subcarinal space. No axillary lymphadenopathy. Evaluation of the hilar regions is limited due to lack of IV contrast. The thyroid gland, trachea, and esophagus are within normal limits. Lungs/Pleura: There are small bilateral pleural effusions. Interstitial prominence and patchy airspace disease is noted bilaterally. Subpleural reticulations are present bilaterally. No pneumothorax. Upper Abdomen: Renal calculi are noted bilaterally. No acute abnormality. Musculoskeletal: Degenerative changes are present in the thoracic spine. No acute osseous abnormality. IMPRESSION: 1. Aortic atherosclerosis without evidence of aneurysm. 2. Coronary artery calcifications and atherosclerotic calcification of the aortic annulus. 3. Interstitial and airspace opacities bilaterally, possible edema versus pneumonitis. 4. Small bilateral pleural effusions. Electronically Signed   By: Brett Fairy M.D.   On: 08/11/2022 21:11   VAS US DOPPLER PRE CABG  Result Date: 08/11/2022 PREOPERATIVE VASCULAR EVALUATION Patient Name:  Jonathan Romero [facility]  Date of Exam:   08/11/2022 Medical Rec #: [id number]       Accession #:    [id number] Date of Birth: June 24, 1946       Patient Gender: M Patient Age:   76 years Exam Location:   [facility] Procedure:      VAS US DOPPLER PRE CABG Referring Phys: Coralie Common --------------------------------------------------------------------------------  Indications:      Pre-CABG. Risk Factors:     Hyperlipidemia, coronary artery disease. Other Factors:    Severe aortic stenosis. Limitations:      Status post radial cath today. Bandages, IV Comparison Study: No prior study Performing Technologist: Sharion Dove RVS  Examination Guidelines: A complete evaluation includes B-mode imaging, spectral Doppler, color Doppler, and power Doppler as needed of all accessible portions of each vessel. Bilateral testing is considered an integral part of a complete examination. Limited examinations for reoccurring indications may be performed as noted.  Right Carotid Findings: +----------+--------+--------+--------+------------+------------------+           PSV cm/sEDV cm/sStenosisDescribe    Comments           +----------+--------+--------+--------+------------+------------------+ CCA Prox  57      14                          intimal thickening +----------+--------+--------+--------+------------+------------------+ CCA Distal65      12                          intimal thickening +----------+--------+--------+--------+------------+------------------+ ICA Prox  72      11              heterogenous                   +----------+--------+--------+--------+------------+------------------+ ICA Mid   53      18                                             +----------+--------+--------+--------+------------+------------------+ ICA Distal78      24                                             +----------+--------+--------+--------+------------+------------------+ ECA       81      11                                             +----------+--------+--------+--------+------------+------------------+ +----------+--------+-------+--------+------------+           PSV cm/sEDV  cmsDescribeArm Pressure +----------+--------+-------+--------+------------+ Subclavian98                                  +----------+--------+-------+--------+------------+ +---------+--------+--+--------+--+ VertebralPSV cm/s52EDV cm/s14 +---------+--------+--+--------+--+ Left Carotid Findings: +----------+--------+--------+--------+------------+------------------+           PSV cm/sEDV cm/sStenosisDescribe    Comments           +----------+--------+--------+--------+------------+------------------+ CCA Prox  74      12  intimal thickening +----------+--------+--------+--------+------------+------------------+ CCA Distal80      16                          intimal thickening +----------+--------+--------+--------+------------+------------------+ ICA Prox  97      25              heterogenous                   +----------+--------+--------+--------+------------+------------------+ ICA Mid   87      26                                             +----------+--------+--------+--------+------------+------------------+ ICA Distal78      21                                             +----------+--------+--------+--------+------------+------------------+ ECA       106     10                                             +----------+--------+--------+--------+------------+------------------+  +----------+--------+--------+---------+------------+ SubclavianPSV cm/sEDV cm/sDescribe Arm Pressure +----------+--------+--------+---------+------------+           233             Turbulent             +----------+--------+--------+---------+------------+ +---------+--------+--+--------+--+ VertebralPSV cm/s55EDV cm/s15 +---------+--------+--+--------+--+  Right Doppler Findings: +------+--------+-----+---------+--------+ Site  PressureIndexDoppler  Comments +------+--------+-----+---------+--------+ Radial              triphasic         +------+--------+-----+---------+--------+ Ulnar              triphasic         +------+--------+-----+---------+--------+  Left Doppler Findings: +------+--------+-----+---------+--------+ Site  PressureIndexDoppler  Comments +------+--------+-----+---------+--------+ Radial             triphasic         +------+--------+-----+---------+--------+ Ulnar              triphasic         +------+--------+-----+---------+--------+  Technologist Notes: Unable to perform Allen's test secondary to radial cath done today.  Summary: Right Carotid: The extracranial vessels were near-normal with only minimal wall                thickening or plaque. Left Carotid: The extracranial vessels were near-normal with only minimal wall               thickening or plaque. Vertebrals:  Bilateral vertebral arteries demonstrate antegrade flow. Subclavians: Left subclavian artery flow was disturbed. Normal flow hemodynamics              were seen in the right subclavian artery. Right Upper Extremity: Doppler waveforms decrease <50% with right radial compression. Doppler waveforms decrease >50% with right ulnar compression.  Electronically signed by Servando Snare MD on 08/11/2022 at 5:32:04 PM.    Final    CARDIAC CATHETERIZATION  Addendum Date: 08/11/2022   CONCLUSIONS: Severe calcific aortic stenosis with calculated aortic valve area less than 0.75 cm. 95% proximal to mid LAD followed by 70% further down the vessel.  The proximal lesion is within a calcified segment. 70 to 80% mid nondominant RCA which supplies faint collaterals to the LAD. Mild pulmonary hypertension.  Capillary wedge pressure 15 mmHg. RECOMMENDATIONS: Surgical aortic valve replacement and LIMA to LAD and possibly the acute marginal branch of the RCA.  Result Date: 08/11/2022   Mid LAD lesion is 70% stenosed.   Prox LAD to Mid LAD lesion is 95% stenosed.   1st Sept lesion is 70% stenosed.   Prox RCA lesion is 75%  stenosed.   There is mild left ventricular systolic dysfunction.   LV end diastolic pressure is normal.   LV end diastolic pressure is normal.   The left ventricular ejection fraction is 45-50% by visual estimate.   Hemodynamic findings consistent with mild pulmonary hypertension and aortic valve stenosis.   There is severe aortic valve stenosis.   ECHOCARDIOGRAM COMPLETE  Result Date: 08/10/2022    ECHOCARDIOGRAM REPORT   Patient Name:   Jonathan Romero Date of Exam: 08/10/2022 Medical Rec #:  [id number]      Height:       70.0 in Accession #:    [id number]     Weight:       155.4 lb Date of Birth:  1945/10/30      BSA:          1.875 m Patient Age:    12 years       BP:           155/87 mmHg Patient Gender: M              HR:           84 bpm. Exam Location:  Inpatient Procedure: 2D Echo, Color Doppler and Cardiac Doppler Indications:    NSTEMI i21.4  History:        Patient has prior history of Echocardiogram examinations, most                 recent 11/25/2018. Risk Factors:Dyslipidemia.  Sonographer:    Raquel Sarna Senior RDCS Referring Phys: [id number] [place]  1. Left ventricular ejection fraction, by estimation, is 40 to 45%. The left ventricle has mildly decreased function. The left ventricle demonstrates global hypokinesis. Left ventricular diastolic parameters are consistent with Grade III diastolic dysfunction (restrictive). Elevated left ventricular end-diastolic pressure.  2. Right ventricular systolic function is normal. The right ventricular size is normal. There is mildly elevated pulmonary artery systolic pressure. The estimated right ventricular systolic pressure is 123XX123 mmHg.  3. Left atrial size was mildly dilated.  4. The mitral valve is degenerative. Mild mitral valve regurgitation. No evidence of mitral stenosis. The mean mitral valve gradient is 3.0 mmHg.  5. The aortic valve has an indeterminant number of cusps. There is severe calcifcation of the aortic valve. There is  severe thickening of the aortic valve. Aortic valve regurgitation is not visualized. Severe aortic valve stenosis. Aortic valve area, by  VTI measures 0.53 cm. Aortic valve mean gradient measures 45.0 mmHg. Aortic valve Vmax measures 4.10 m/s.  6. The inferior vena cava is dilated in size with <50% respiratory variability, suggesting right atrial pressure of 15 mmHg. FINDINGS  Left Ventricle: Left ventricular ejection fraction, by estimation, is 40 to 45%. The left ventricle has mildly decreased function. The left ventricle demonstrates global hypokinesis. The left ventricular internal cavity size was normal in size. There is  no left ventricular hypertrophy. Left ventricular diastolic parameters are consistent with Grade III diastolic dysfunction (restrictive). Elevated  left ventricular end-diastolic pressure. Right Ventricle: The right ventricular size is normal. No increase in right ventricular wall thickness. Right ventricular systolic function is normal. There is mildly elevated pulmonary artery systolic pressure. The tricuspid regurgitant velocity is 2.36  m/s, and with an assumed right atrial pressure of 15 mmHg, the estimated right ventricular systolic pressure is 37.3 mmHg. Left Atrium: Left atrial size was mildly dilated. Right Atrium: Right atrial size was normal in size. Pericardium: There is no evidence of pericardial effusion. Mitral Valve: The mitral valve is degenerative in appearance. There is moderate thickening of the mitral valve leaflet(s). There is mild calcification of the mitral valve leaflet(s). Mild to moderate mitral annular calcification. Mild mitral valve regurgitation. No evidence of mitral valve stenosis. MV peak gradient, 9.5 mmHg. The mean mitral valve gradient is 3.0 mmHg. Tricuspid Valve: The tricuspid valve is normal in structure. Tricuspid valve regurgitation is not demonstrated. No evidence of tricuspid stenosis. Aortic Valve: The aortic valve has an indeterminant number of  cusps. There is severe calcifcation of the aortic valve. There is severe thickening of the aortic valve. Aortic valve regurgitation is not visualized. Severe aortic stenosis is present. Aortic  valve mean gradient measures 45.0 mmHg. Aortic valve peak gradient measures 67.2 mmHg. Aortic valve area, by VTI measures 0.53 cm. Pulmonic Valve: The pulmonic valve was normal in structure. Pulmonic valve regurgitation is not visualized. No evidence of pulmonic stenosis. Aorta: The aortic root is normal in size and structure. Venous: The inferior vena cava is dilated in size with less than 50% respiratory variability, suggesting right atrial pressure of 15 mmHg. IAS/Shunts: No atrial level shunt detected by color flow Doppler.  LEFT VENTRICLE PLAX 2D LVIDd:         4.75 cm      Diastology LVIDs:         3.60 cm      LV e' medial:    4.57 cm/s LV PW:         0.95 cm      LV E/e' medial:  30.6 LV IVS:        0.90 cm      LV e' lateral:   5.22 cm/s LVOT diam:     1.95 cm      LV E/e' lateral: 26.8 LV SV:         48 LV SV Index:   26 LVOT Area:     2.99 cm  LV Volumes (MOD) LV vol d, MOD A2C: 140.0 ml LV vol d, MOD A4C: 102.0 ml LV vol s, MOD A2C: 73.1 ml LV vol s, MOD A4C: 60.1 ml LV SV MOD A2C:     66.9 ml LV SV MOD A4C:     102.0 ml LV SV MOD BP:      53.5 ml RIGHT VENTRICLE RV S prime:     10.80 cm/s TAPSE (M-mode): 1.8 cm LEFT ATRIUM             Index        RIGHT ATRIUM           Index LA diam:        4.05 cm 2.16 cm/m   RA Area:     17.20 cm LA Vol (A2C):   56.0 ml 29.87 ml/m  RA Volume:   48.70 ml  25.97 ml/m LA Vol (A4C):   65.9 ml 35.15 ml/m LA Biplane Vol: 64.9 ml 34.61 ml/m  AORTIC VALVE AV Area (Vmax):    0.53 cm  AV Area (Vmean):   0.51 cm AV Area (VTI):     0.53 cm AV Vmax:           410.00 cm/s AV Vmean:          322.000 cm/s AV VTI:            0.911 m AV Peak Grad:      67.2 mmHg AV Mean Grad:      45.0 mmHg LVOT Vmax:         72.30 cm/s LVOT Vmean:        55.000 cm/s LVOT VTI:          0.162 m LVOT/AV  VTI ratio: 0.18  AORTA Ao Root diam: 3.05 cm MITRAL VALVE                TRICUSPID VALVE MV Area (PHT): 4.68 cm     TR Peak grad:   22.3 mmHg MV Area VTI:   2.14 cm     TR Vmax:        236.00 cm/s MV Peak grad:  9.5 mmHg MV Mean grad:  3.0 mmHg     SHUNTS MV Vmax:       1.54 m/s     Systemic VTI:  0.16 m MV Vmean:      81.8 cm/s    Systemic Diam: 1.95 cm MV Decel Time: 162 msec MV E velocity: 140.00 cm/s MV A velocity: 53.40 cm/s MV E/A ratio:  2.62 Fransico Him MD Electronically signed by Fransico Him MD Signature Date/Time: 08/10/2022/6:05:45 PM    Final     Cardiac Studies   Cath: 08/11/22  CONCLUSIONS: Severe calcific aortic stenosis with calculated aortic valve area less than 0.75 cm. 95% proximal to mid LAD followed by 70% further down the vessel.  The proximal lesion is within a calcified segment. 70 to 80% mid nondominant RCA which supplies faint collaterals to the LAD. Mild pulmonary hypertension.  Capillary wedge pressure 15 mmHg.   RECOMMENDATIONS: Surgical aortic valve replacement and LIMA to LAD and possibly the acute marginal branch of the RCA.  Diagnostic Dominance: Left  Echo: 08/10/22  IMPRESSIONS     1. Left ventricular ejection fraction, by estimation, is 40 to 45%. The  left ventricle has mildly decreased function. The left ventricle  demonstrates global hypokinesis. Left ventricular diastolic parameters are  consistent with Grade III diastolic  dysfunction (restrictive). Elevated left ventricular end-diastolic  pressure.   2. Right ventricular systolic function is normal. The right ventricular  size is normal. There is mildly elevated pulmonary artery systolic  pressure. The estimated right ventricular systolic pressure is 123XX123 mmHg.   3. Left atrial size was mildly dilated.   4. The mitral valve is degenerative. Mild mitral valve regurgitation. No  evidence of mitral stenosis. The mean mitral valve gradient is 3.0 mmHg.   5. The aortic valve has an  indeterminant number of cusps. There is severe  calcifcation of the aortic valve. There is severe thickening of the aortic  valve. Aortic valve regurgitation is not visualized. Severe aortic valve  stenosis. Aortic valve area, by   VTI measures 0.53 cm. Aortic valve mean gradient measures 45.0 mmHg.  Aortic valve Vmax measures 4.10 m/s.   6. The inferior vena cava is dilated in size with <50% respiratory  variability, suggesting right atrial pressure of 15 mmHg.   FINDINGS   Left Ventricle: Left ventricular ejection fraction, by estimation, is 40  to 45%. The left ventricle has mildly  decreased function. The left  ventricle demonstrates global hypokinesis. The left ventricular internal  cavity size was normal in size. There is   no left ventricular hypertrophy. Left ventricular diastolic parameters  are consistent with Grade III diastolic dysfunction (restrictive).  Elevated left ventricular end-diastolic pressure.   Right Ventricle: The right ventricular size is normal. No increase in  right ventricular wall thickness. Right ventricular systolic function is  normal. There is mildly elevated pulmonary artery systolic pressure. The  tricuspid regurgitant velocity is 2.36   m/s, and with an assumed right atrial pressure of 15 mmHg, the estimated  right ventricular systolic pressure is 123XX123 mmHg.   Left Atrium: Left atrial size was mildly dilated.   Right Atrium: Right atrial size was normal in size.   Pericardium: There is no evidence of pericardial effusion.   Mitral Valve: The mitral valve is degenerative in appearance. There is  moderate thickening of the mitral valve leaflet(s). There is mild  calcification of the mitral valve leaflet(s). Mild to moderate mitral  annular calcification. Mild mitral valve  regurgitation. No evidence of mitral valve stenosis. MV peak gradient, 9.5  mmHg. The mean mitral valve gradient is 3.0 mmHg.   Tricuspid Valve: The tricuspid valve is normal  in structure. Tricuspid  valve regurgitation is not demonstrated. No evidence of tricuspid  stenosis.   Aortic Valve: The aortic valve has an indeterminant number of cusps. There  is severe calcifcation of the aortic valve. There is severe thickening of  the aortic valve. Aortic valve regurgitation is not visualized. Severe  aortic stenosis is present. Aortic   valve mean gradient measures 45.0 mmHg. Aortic valve peak gradient  measures 67.2 mmHg. Aortic valve area, by VTI measures 0.53 cm.   Pulmonic Valve: The pulmonic valve was normal in structure. Pulmonic valve  regurgitation is not visualized. No evidence of pulmonic stenosis.   Aorta: The aortic root is normal in size and structure.   Venous: The inferior vena cava is dilated in size with less than 50%  respiratory variability, suggesting right atrial pressure of 15 mmHg.   IAS/Shunts: No atrial level shunt detected by color flow Doppler.       Patient Profile     76 y.o. male with PMH of HLD and moderate to severe AS who presented with chest pain.   Assessment & Plan    NSTEMI -- High-sensitivity troponin peaked at 2108.  Underwent cardiac catheterization noted above with calcified proximal/mid 95% LAD stenosis followed by 70% LAD stenosis, nondominant RCA with 85% mid stenosis marginal branch of RCA supplying collaterals to mid LAD.  Severe aortic valve stenosis with peak to peak gradient 67 mmHg and mean gradient of 53 mmHg.  TCTS consulted with plans for CABG plus SAVR 11/22 -- Continue aspirin, metoprolol 12.5 mg twice daily and IV heparin  HFrEF ICM -- LVEF of 40-45% global hypokinesis, grade 3 diastolic dysfunction, normal RV size and function -- GDMT limited in the setting of soft blood pressure, currently tolerating metoprolol 25 mg twice daily, will consolidated Toprol-XL 25 mg daily.  Consider further optimization of medication postsurgery  Severe AS Mild MR -- Echo 11/19 aortic valve mean gradient measures  45 mmHg, aortic valve peak gradient measures 67.2 mmHg, aortic valve V-max 4.1 m/s -- Cath noted above with severe aortic valve stenosis with peak to peak gradient of 67 mmHg and mean gradient of 53 mmHg -- As above evaluated by TCTS with plans for CABG plus SAVR  HLD -- History of  statin intolerant, has tried several different types with significant myalgias and fatigue.  Also had issues with Zetia -- Would plan for outpatient lipid clinic referral for consideration of PCSK9i  For questions or updates, please contact [place] Please consult [email or web address] for contact info under        Signed, Reino Bellis, NP  08/12/2022, 9:35 AM

## 2022-08-12 NOTE — Progress Notes (Signed)
Heart Failure Navigator Progress Note  Following this hospitalization to assess for HV TOC readiness.   EF 40-45% AVR / CABG 08/13/22  Rhae Hammock, BSN, RN Heart Failure Nurse Navigator Secure Chat Only

## 2022-08-12 NOTE — Progress Notes (Signed)
      [address]       [place] [address]             [phone]      1 Day Post-Op  Procedure(s) (LRB): RIGHT/LEFT HEART CATH AND CORONARY ANGIOGRAPHY (N/A)   Total Length of Stay:  LOS: 3 days    SUBJECTIVE: No new events Pt Noncontrast CT yesterday with significant ascending aortic calcification that precluded SAVR/CABG. Had presented to structural heart mtg this am and plan is to proceed with TAVR with medical management of CAD. Vitals:   08/12/22 0535 08/12/22 0727  BP: 113/69 110/66  Pulse: 80 74  Resp: (P) 18 18  Temp: (P) 98.4 F (36.9 C) 98.7 F (37.1 C)  SpO2: 96% 96%    Intake/Output      11/20 0701 11/21 0700 11/21 0701 11/22 0700   P.O. 1194 540   I.V. (mL/kg) 175.3 (2.5)    Total Intake(mL/kg) 1369.3 (19.3) 540 (7.6)   Urine (mL/kg/hr)     Total Output     Net +1369.3 +540        Urine Occurrence 1 x        sodium chloride Stopped (08/11/22 1319)   heparin 1,350 Units/hr (08/12/22 1132)    CBC    Component Value Date/Time   WBC 10.3 08/12/2022 0043   RBC 3.36 (L) 08/12/2022 0043   HGB 11.6 (L) 08/12/2022 0043   HCT 33.9 (L) 08/12/2022 0043   PLT 127 (L) 08/12/2022 0043   MCV 100.9 (H) 08/12/2022 0043   MCH 34.5 (H) 08/12/2022 0043   MCHC 34.2 08/12/2022 0043   RDW 13.6 08/12/2022 0043   LYMPHSABS 1.5 08/09/2022 1841   MONOABS 0.4 08/09/2022 1841   EOSABS 0.1 08/09/2022 1841   BASOSABS 0.1 08/09/2022 1841   CMP     Component Value Date/Time   NA 138 08/12/2022 0043   K 4.0 08/12/2022 0043   CL 108 08/12/2022 0043   CO2 21 (L) 08/12/2022 0043   GLUCOSE 106 (H) 08/12/2022 0043   BUN 18 08/12/2022 0043   CREATININE 0.90 08/12/2022 0043   CREATININE 0.82 03/21/2013 1142   CALCIUM 8.1 (L) 08/12/2022 0043   PROT 5.7 (L) 08/09/2022 1841   ALBUMIN 3.0 (L) 08/09/2022 1841   AST 40 08/09/2022 1841   ALT 19 08/09/2022 1841   ALKPHOS 75 08/09/2022 1841   BILITOT 1.6 (H) 08/09/2022 1841   GFRNONAA >60 08/12/2022  0043   GFRAA >60 12/08/2018 1552   ABG    Component Value Date/Time   PHART 7.399 08/11/2022 0824   PCO2ART 35.0 08/11/2022 0824   PO2ART 53 (L) 08/11/2022 0824   HCO3 21.6 08/11/2022 0824   TCO2 23 08/11/2022 0824   ACIDBASEDEF 3.0 (H) 08/11/2022 0824   O2SAT 87 08/11/2022 0824   CBG (last 3)  Recent Labs    08/09/22 1842 08/11/22 1113  GLUCAP 154* 179*     ASSESSMENT: AS/CAD Will proceed with TAVR next week and assess for valve size/access with TAVR CTA today    Eugenio Hoes, MD @DATE @

## 2022-08-12 NOTE — Progress Notes (Signed)
ANTICOAGULATION CONSULT NOTE  Pharmacy Consult for Heparin Indication: chest pain/ACS   Allergies  Allergen Reactions   Statins     Patient Measurements: Height: 5\' 10"  (177.8 cm) Weight: 69.2 kg (152 lb 8 oz) IBW/kg (Calculated) : 73 Heparin Dosing Weight: 70.5 kg  Vital Signs: Temp: 98 F (36.7 C) (11/21 0010) Temp Source: Oral (11/21 0010) BP: 109/69 (11/21 0010) Pulse Rate: 74 (11/21 0010)  Labs: Recent Labs    08/09/22 1841 08/09/22 2047 08/10/22 0426 08/10/22 2136 08/11/22 0047 08/11/22 0813 08/11/22 0814 08/11/22 0824 08/12/22 0043  HGB 12.0*  --   --   --  12.8*   < > 12.6* 11.9* 11.6*  HCT 34.9*  --   --   --  38.6*   < > 37.0* 35.0* 33.9*  PLT 149*  --   --   --  147*  --   --   --  127*  APTT 28  --   --   --   --   --   --   --   --   LABPROT 13.7  --   --   --   --   --   --   --   --   INR 1.1  --   --   --   --   --   --   --   --   HEPARINUNFRC  --   --    < > 0.32 0.27*  --   --   --  <0.10*  CREATININE 1.12  --   --   --  1.11  --   --   --  0.90  TROPONINIHS 2,108* 2,044*  --   --   --   --   --   --   --    < > = values in this interval not displayed.     Estimated Creatinine Clearance: 66.2 mL/min (by C-G formula based on SCr of 0.9 mg/dL).  Assessment: 76 year old male admitted for nstemi workup. 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 Patient being evaluated for surgical aortic valve replacement and bypass grafts. Orders to resume heparin this afternoon.   11/21 AM update:  Heparin level low after re-start Likely CABG/AVR 11/22  Goal of Therapy:  Heparin level 0.3-0.7 units/ml Monitor platelets by anticoagulation protocol: Yes   Plan:  Inc heparin to 1350 units/hr 1100 heparin level  12/22, PharmD, BCPS Clinical Pharmacist Phone: [phone]

## 2022-08-12 NOTE — Progress Notes (Addendum)
ANTICOAGULATION CONSULT NOTE  Pharmacy Consult for Heparin Indication: chest pain/ACS   Allergies  Allergen Reactions   Statins     Patient Measurements: Height: 5\' 10"  (177.8 cm) Weight: (P) 70.9 kg (156 lb 6.4 oz) IBW/kg (Calculated) : 73 Heparin Dosing Weight: 70.5 kg  Vital Signs: Temp: (P) 98.4 F (36.9 C) (11/21 0535) Temp Source: (P) Oral (11/21 0535) BP: 113/69 (11/21 0535) Pulse Rate: 80 (11/21 0535)  Labs: Recent Labs    08/09/22 1841 08/09/22 2047 08/10/22 0426 08/10/22 2136 08/11/22 0047 08/11/22 0813 08/11/22 0814 08/11/22 0824 08/12/22 0043  HGB 12.0*  --   --   --  12.8*   < > 12.6* 11.9* 11.6*  HCT 34.9*  --   --   --  38.6*   < > 37.0* 35.0* 33.9*  PLT 149*  --   --   --  147*  --   --   --  127*  APTT 28  --   --   --   --   --   --   --   --   LABPROT 13.7  --   --   --   --   --   --   --   --   INR 1.1  --   --   --   --   --   --   --   --   HEPARINUNFRC  --   --    < > 0.32 0.27*  --   --   --  <0.10*  CREATININE 1.12  --   --   --  1.11  --   --   --  0.90  TROPONINIHS 2,108* 2,044*  --   --   --   --   --   --   --    < > = values in this interval not displayed.     Estimated Creatinine Clearance: 66.2 mL/min (by C-G formula based on SCr of 0.9 mg/dL).  Assessment: 76 year old male admitted for nstemi workup. 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 Patient being evaluated for surgical aortic valve replacement and bypass grafts. Orders to resume heparin post cath  11/21 update:  Heparin level still low after adjustments overnight. No bleeding or IV issues noted.   Surgery plans are currently pending.   Goal of Therapy:  Heparin level 0.3-0.7 units/ml Monitor platelets by anticoagulation protocol: Yes   Plan:  Increase heparin to 1550 units/hr 2200 heparin level  12/21 PharmD., BCPS Clinical Pharmacist 08/12/2022 7:25 AM

## 2022-08-12 NOTE — Progress Notes (Signed)
CARDIAC REHAB PHASE I     Pre [facility] education including sternal precautions, move in the tub, [facility] booklet, [facility] handout, IS use, home needs at discharge and pain management reviewed  with pt and family. All questions and concerns addressed. Plan for surgery tomorrow.   [id number]  Woodroe Chen, RN BSN 08/12/2022 12:17 PM

## 2022-08-13 ENCOUNTER — Encounter (HOSPITAL_COMMUNITY): Payer: Self-pay | Admitting: Student in an Organized Health Care Education/Training Program

## 2022-08-13 ENCOUNTER — Encounter (HOSPITAL_COMMUNITY)
Admission: EM | Disposition: A | Discharge status: Home / Self Care | Payer: Self-pay | Source: Home / Self Care | Attending: Cardiology

## 2022-08-13 HISTORY — PX: CORONARY STENT INTERVENTION: CATH118234

## 2022-08-13 HISTORY — PX: CORONARY ATHERECTOMY: CATH118238

## 2022-08-13 LAB — POCT ACTIVATED CLOTTING TIME
Activated Clotting Time: 275 seconds
Activated Clotting Time: 281 seconds
Activated Clotting Time: 305 seconds

## 2022-08-13 LAB — CBC
HCT: 36.3 % — ABNORMAL LOW (ref 39.0–52.0)
Hemoglobin: 12.2 g/dL — ABNORMAL LOW (ref 13.0–17.0)
MCH: 33.2 pg (ref 26.0–34.0)
MCHC: 33.6 g/dL (ref 30.0–36.0)
MCV: 98.9 fL (ref 80.0–100.0)
Platelets: 138 10*3/uL — ABNORMAL LOW (ref 150–400)
RBC: 3.67 MIL/uL — ABNORMAL LOW (ref 4.22–5.81)
RDW: 13.2 % (ref 11.5–15.5)
WBC: 8.6 10*3/uL (ref 4.0–10.5)
nRBC: 0 % (ref 0.0–0.2)

## 2022-08-13 LAB — HEPARIN LEVEL (UNFRACTIONATED): Heparin Unfractionated: 0.33 IU/mL (ref 0.30–0.70)

## 2022-08-13 SURGERY — CORONARY STENT INTERVENTION
Anesthesia: LOCAL

## 2022-08-13 SURGERY — CORONARY ARTERY BYPASS GRAFTING (CABG)
Anesthesia: General | Site: Chest

## 2022-08-13 MED ORDER — LIDOCAINE HCL (PF) 1 % IJ SOLN
INTRAMUSCULAR | Status: DC | PRN
  Administered 2022-08-13: 2 mL via INTRADERMAL

## 2022-08-13 MED ORDER — NOREPINEPHRINE 4 MG/250ML-% IV SOLN
INTRAVENOUS | Status: AC
  Filled 2022-08-13: qty 250

## 2022-08-13 MED ORDER — SODIUM CHLORIDE 0.9 % WEIGHT BASED INFUSION: 3.0000 mL/kg/h | INTRAVENOUS | Status: DC

## 2022-08-13 MED ORDER — MIDAZOLAM HCL 2 MG/2ML IJ SOLN
INTRAMUSCULAR | Status: DC | PRN
  Administered 2022-08-13: 2 mg via INTRAVENOUS

## 2022-08-13 MED ORDER — FENTANYL CITRATE (PF) 100 MCG/2ML IJ SOLN
INTRAMUSCULAR | Status: AC
  Filled 2022-08-13: qty 2

## 2022-08-13 MED ORDER — SODIUM CHLORIDE 0.9 % IV SOLN
INTRAVENOUS | Status: AC | PRN
  Administered 2022-08-13: 250 mL via INTRAVENOUS

## 2022-08-13 MED ORDER — SODIUM CHLORIDE 0.9 % WEIGHT BASED INFUSION: 1.0000 mL/kg/h | INTRAVENOUS | Status: DC

## 2022-08-13 MED ORDER — SODIUM CHLORIDE 0.9% FLUSH: 3.0000 mL | Freq: Two times a day (BID) | INTRAVENOUS | Status: DC

## 2022-08-13 MED ORDER — SODIUM CHLORIDE 0.9% FLUSH: 3.0000 mL | INTRAVENOUS | Status: DC | PRN

## 2022-08-13 MED ORDER — SODIUM CHLORIDE 0.9 % WEIGHT BASED INFUSION
1.0000 mL/kg/h | INTRAVENOUS | Status: DC
  Administered 2022-08-13: 1 mL/kg/h via INTRAVENOUS

## 2022-08-13 MED ORDER — LIDOCAINE HCL (PF) 1 % IJ SOLN
INTRAMUSCULAR | Status: AC
  Filled 2022-08-13: qty 30

## 2022-08-13 MED ORDER — FENTANYL CITRATE (PF) 100 MCG/2ML IJ SOLN
INTRAMUSCULAR | Status: DC | PRN
  Administered 2022-08-13: 25 ug via INTRAVENOUS

## 2022-08-13 MED ORDER — VERAPAMIL HCL 2.5 MG/ML IV SOLN
INTRAVENOUS | Status: AC
  Filled 2022-08-13: qty 2

## 2022-08-13 MED ORDER — SODIUM CHLORIDE 0.9 % IV SOLN: 250.0000 mL | INTRAVENOUS | Status: DC | PRN

## 2022-08-13 MED ORDER — VERAPAMIL HCL 2.5 MG/ML IV SOLN
INTRAVENOUS | Status: DC | PRN
  Administered 2022-08-13: 10 mL via INTRA_ARTERIAL

## 2022-08-13 MED ORDER — HEPARIN SODIUM (PORCINE) 1000 UNIT/ML IJ SOLN
INTRAMUSCULAR | Status: DC | PRN
  Administered 2022-08-13: 3000 [IU] via INTRAVENOUS
  Administered 2022-08-13: 7000 [IU] via INTRAVENOUS
  Administered 2022-08-13: 4000 [IU] via INTRAVENOUS

## 2022-08-13 MED ORDER — HYDROCORTISONE 1 % EX CREA
1.0000 | TOPICAL_CREAM | Freq: Three times a day (TID) | CUTANEOUS | Status: DC | PRN
  Administered 2022-08-13: 1 via TOPICAL
  Filled 2022-08-13: qty 28

## 2022-08-13 MED ORDER — NITROGLYCERIN 1 MG/10 ML FOR IR/CATH LAB
INTRA_ARTERIAL | Status: DC | PRN
  Administered 2022-08-13 (×2): 100 ug via INTRACORONARY

## 2022-08-13 MED ORDER — HEPARIN (PORCINE) IN NACL 1000-0.9 UT/500ML-% IV SOLN
INTRAVENOUS | Status: AC
  Filled 2022-08-13: qty 1000

## 2022-08-13 MED ORDER — SODIUM CHLORIDE 0.9 % WEIGHT BASED INFUSION
1.0000 mL/kg/h | INTRAVENOUS | Status: AC
  Administered 2022-08-13: 1 mL/kg/h via INTRAVENOUS

## 2022-08-13 MED ORDER — LABETALOL HCL 5 MG/ML IV SOLN: 10.0000 mg | INTRAVENOUS | Status: AC | PRN

## 2022-08-13 MED ORDER — SODIUM CHLORIDE 0.9 % WEIGHT BASED INFUSION
3.0000 mL/kg/h | INTRAVENOUS | Status: DC
  Administered 2022-08-13: 3 mL/kg/h via INTRAVENOUS

## 2022-08-13 MED ORDER — HEPARIN SODIUM (PORCINE) 1000 UNIT/ML IJ SOLN
INTRAMUSCULAR | Status: AC
  Filled 2022-08-13: qty 10

## 2022-08-13 MED ORDER — IOHEXOL 350 MG/ML SOLN
INTRAVENOUS | Status: DC | PRN
  Administered 2022-08-13: 142 mL

## 2022-08-13 MED ORDER — NITROGLYCERIN 1 MG/10 ML FOR IR/CATH LAB
INTRA_ARTERIAL | Status: AC
  Filled 2022-08-13: qty 10

## 2022-08-13 MED ORDER — HYDRALAZINE HCL 20 MG/ML IJ SOLN: 10.0000 mg | INTRAMUSCULAR | Status: AC | PRN

## 2022-08-13 MED ORDER — HEPARIN (PORCINE) IN NACL 1000-0.9 UT/500ML-% IV SOLN
INTRAVENOUS | Status: DC | PRN
  Administered 2022-08-13 (×2): 500 mL

## 2022-08-13 MED ORDER — MIDAZOLAM HCL 2 MG/2ML IJ SOLN
INTRAMUSCULAR | Status: AC
  Filled 2022-08-13: qty 2

## 2022-08-13 SURGICAL SUPPLY — 28 items
BALL SAPPHIRE NC24 3.25X15 (BALLOONS) ×1
BALLN SAPPHIRE 2.5X12 (BALLOONS) ×1
BALLN ~~LOC~~ EMERGE MR 3.75X12 (BALLOONS) ×1
BALLOON SAPPHIRE 2.5X12 (BALLOONS) IMPLANT
BALLOON SAPPHIRE NC24 3.25X15 (BALLOONS) IMPLANT
BALLOON ~~LOC~~ EMERGE MR 3.75X12 (BALLOONS) IMPLANT
CATH DRAGONFLY OPSTAR (CATHETERS) IMPLANT
CATH VISTA GUIDE 6FR XBLAD3.5 (CATHETERS) IMPLANT
CROWN DIAMONDBACK CLASSIC 1.25 (BURR) IMPLANT
DEVICE RAD COMP TR BAND LRG (VASCULAR PRODUCTS) IMPLANT
ELECT DEFIB PAD ADLT CADENCE (PAD) IMPLANT
GLIDESHEATH SLEND SS 6F .021 (SHEATH) IMPLANT
GUIDEWIRE INQWIRE 1.5J.035X260 (WIRE) IMPLANT
INQWIRE 1.5J .035X260CM (WIRE) ×1
KIT ENCORE 26 ADVANTAGE (KITS) IMPLANT
KIT HEART LEFT (KITS) ×1 IMPLANT
KIT HEMO VALVE WATCHDOG (MISCELLANEOUS) IMPLANT
PACK CARDIAC CATHETERIZATION (CUSTOM PROCEDURE TRAY) ×1 IMPLANT
STENT SYNERGY XD 2.75X12 (Permanent Stent) IMPLANT
STENT SYNERGY XD 3.0X20 (Permanent Stent) IMPLANT
STENT SYNERGY XD 3.50X16 (Permanent Stent) IMPLANT
SYNERGY XD 2.75X12 (Permanent Stent) ×1 IMPLANT
SYNERGY XD 3.0X20 (Permanent Stent) ×1 IMPLANT
SYNERGY XD 3.50X16 (Permanent Stent) ×1 IMPLANT
TRANSDUCER W/STOPCOCK (MISCELLANEOUS) ×1 IMPLANT
TUBING CIL FLEX 10 FLL-RA (TUBING) ×1 IMPLANT
WIRE COUGAR XT STRL 190CM (WIRE) IMPLANT
WIRE VIPERWIRE COR FLEX .012 (WIRE) IMPLANT

## 2022-08-13 NOTE — H&P (View-Only) (Signed)
Rounding Note    Patient Name: Jonathan Romero Date of Encounter: 08/13/2022  [place] Cardiologist: None   Subjective   Feeling well this morning. No chest pain. Family at the bedside.   Inpatient Medications    Scheduled Meds:  aspirin EC  81 mg Oral Daily   clopidogrel  75 mg Oral Daily   metoprolol succinate  25 mg Oral Daily   sodium chloride flush  3 mL Intravenous Q12H   sodium chloride flush  3 mL Intravenous Q12H   Continuous Infusions:  sodium chloride Stopped (08/11/22 1319)   sodium chloride     sodium chloride 1 mL/kg/hr (08/13/22 0723)   heparin 1,650 Units/hr (08/12/22 2357)   PRN Meds: sodium chloride, sodium chloride, acetaminophen, nitroGLYCERIN, ondansetron (ZOFRAN) IV, oxyCODONE, sodium chloride flush, sodium chloride flush   Vital Signs    Vitals:   08/12/22 1918 08/13/22 0045 08/13/22 0419 08/13/22 0553  BP: 109/79 123/73 121/70   Pulse: 75 94 84   Resp: 20     Temp: 98.3 F (36.8 C) 98.3 F (36.8 C) 98.7 F (37.1 C)   TempSrc: Oral Oral Oral   SpO2: 97% 96% 95%   Weight:  71.3 kg  71.3 kg  Height:        Intake/Output Summary (Last 24 hours) at 08/13/2022 0748 Last data filed at 08/12/2022 2300 Gross per 24 hour  Intake 840 ml  Output --  Net 840 ml      08/13/2022    5:53 AM 08/13/2022   12:45 AM 08/12/2022    5:35 AM  Last 3 Weights  Weight (lbs) 157 lb 3 oz 157 lb 3.2 oz 156 lb 6.4 oz  Weight (kg) 71.3 kg 71.305 kg 70.943 kg      Telemetry    Sinus Rhythm, brief episodes of NSVT - Personally Reviewed  ECG    No new tracing  Physical Exam   GEN: No acute distress.   Neck: No JVD Cardiac: RRR, 4/6 systolic murmur RUSB, no rubs, or gallops.  Respiratory: Clear to auscultation bilaterally. GI: Soft, nontender, non-distended  MS: No edema; No deformity. Neuro:  Nonfocal  Psych: Normal affect   Labs    High Sensitivity Troponin:   Recent Labs  Lab 08/09/22 1841 08/09/22 2047  TROPONINIHS  2,108* 2,044*     Chemistry Recent Labs  Lab 08/09/22 1841 08/11/22 0047 08/11/22 0813 08/11/22 0814 08/11/22 0824 08/12/22 0043  NA 139 141   < > 142 140 138  K 4.6 3.8   < > 3.7 3.8 4.0  CL 106 109  --   --   --  108  CO2 22 23  --   --   --  21*  GLUCOSE 148* 147*  --   --   --  106*  BUN 25* 24*  --   --   --  18  CREATININE 1.12 1.11  --   --   --  0.90  CALCIUM 8.5* 8.5*  --   --   --  8.1*  MG 2.0  --   --   --   --   --   PROT 5.7*  --   --   --   --   --   ALBUMIN 3.0*  --   --   --   --   --   AST 40  --   --   --   --   --   ALT 19  --   --   --   --   --  ALKPHOS 75  --   --   --   --   --   BILITOT 1.6*  --   --   --   --   --   GFRNONAA >60 >60  --   --   --  >60  ANIONGAP 11 9  --   --   --  9   < > = values in this interval not displayed.    Lipids  Recent Labs  Lab 08/10/22 2136  CHOL 215*  TRIG 49  HDL 45  LDLCALC 160*  CHOLHDL 4.8    Hematology Recent Labs  Lab 08/11/22 0047 08/11/22 0813 08/11/22 0824 08/12/22 0043 08/13/22 0129  WBC 9.2  --   --  10.3 8.6  RBC 3.81*  --   --  3.36* 3.67*  HGB 12.8*   < > 11.9* 11.6* 12.2*  HCT 38.6*   < > 35.0* 33.9* 36.3*  MCV 101.3*  --   --  100.9* 98.9  MCH 33.6  --   --  34.5* 33.2  MCHC 33.2  --   --  34.2 33.6  RDW 13.4  --   --  13.6 13.2  PLT 147*  --   --  127* 138*   < > = values in this interval not displayed.   Thyroid  Recent Labs  Lab 08/09/22 1841 08/09/22 1843  TSH  --  2.157  FREET4 0.95  --     BNP Recent Labs  Lab 08/09/22 1843  BNP 486.3*    DDimer No results for input(s): "DDIMER" in the last 168 hours.   Radiology    CT CORONARY MORPH W/CTA COR W/SCORE W/CA W/CM &/OR WO/CM  Addendum Date: 08/12/2022   ADDENDUM REPORT: 08/12/2022 15:24 CLINICAL DATA:  Aortic Valve pathology with assessment for TAVR EXAM: Cardiac TAVR CT TECHNIQUE: The patient was scanned on a Siemens Force 192 slice scanner. A 120 kV retrospective scan was triggered in the descending thoracic  aorta at 111 HU's. Gantry rotation speed was 270 msecs and collimation was .9 mm. No beta blockade or nitro were given. The 3D data set was reconstructed in 5% intervals of the R-R cycle. Systolic and diastolic phases were analyzed on a dedicated work station using MPR, MIP and VRT modes. The patient received 95 cc of contrast. FINDINGS: Aortic Valve: Severely thickened tri-leaflet aortic valve with heavy calcification and reduced excursion the planimeter valve area is 0.924 Sq cm consistent with severe aortic stenosis Number of leaflets: 3 LVOT calcification: Presence of LVOT calcification into the intervalvular fibrosa Annular calcification: Severe due to presence of LVOT calcification, moderate otherwise Aortic Valve Calcium Score: 2749 Presence of basal septal hypertrophy: Non severe Perimembranous septal diameter: 7 mm Mitral Valve: No significant mitral annular calcification Aortic Annulus Measurements- 30% phase Major annulus diameter: 29 mm Minor annulus diameter: 21 mm Annular perimeter: 79 mm Annular area: 4.67 cm2 Aortic Root Measurements- 70% phase Sinotubular Junction: 30 mm Ascending Thoracic Aorta: 36 mm Aortic Arch: 28 mm Descending Thoracic Aorta: 25 mm Aortic atherosclerosis. Sinus of Valsalva Measurements: Right coronary cusp width: 29 mm Left coronary cusp width: 29 mm Non coronary cusp width: 28 mm Coronary Artery Height above Annulus: Left Main: 13 mm Left SoV height: 20 mm Right Coronary: 14 mm Right SoV height: 20 mm Optimum Fluoroscopic Angle for Delivery: LAO 5, CAU 8 Cusp overlay view angle: RAO 0 CAU 11 Valves for structural team consideration: 26 mm Sapien Valve 29 mm Evolut Non TAVR  Valve Findings: Coronary Arteries: Normal coronary origin. Study not completed with nitroglycerin. Coronary Calcium Score: Left main: 504 Left anterior descending artery: 951 Left circumflex artery: 237 Right coronary artery: 34 Total: 1726 Percentile: 83rd for age, sex, and race matched control. Systemic  veins: Normal anatomy Main Pulmonary artery: Normal caliber Pulmonary veins: Normal anatomy Left atrial appendage: Patent Interatrial septum: No communications Left ventricle: Normal size Left atrium: Mild dilation Right ventricle: Normal size Right atrium: Mild dilation Pericardium: Trivial anterior pericardial effusion Extra Cardiac Findings as per separate reporting. IMPRESSION: 1. Severe Aortic stenosis. Findings pertinent to TAVR procedure are detailed above. RECOMMENDATIONS: The proposed cut-off value of 1,651 AU yielded a 93 % sensitivity and 75 % specificity in grading AS severity in patients with classical low-flow, low-gradient AS. Proposed different cut-off values to define severe AS for men and women as 2,065 AU and 1,274 AU, respectively. The joint European and American recommendations for the assessment of AS consider the aortic valve calcium score as a continuum - a very high calcium score suggests severe AS and a low calcium score suggests severe AS is unlikely. Kerman Passey, et al. 2017 ESC/EACTS Guidelines for the management of valvular heart disease. Eur Heart J [phone] Coronary artery calcium (CAC) score is a strong predictor of incident coronary heart disease (CHD) and provides predictive information beyond traditional risk factors. CAC scoring is reasonable to use in the decision to withhold, postpone, or initiate statin therapy in intermediate-risk or selected borderline-risk asymptomatic adults (age 83-75 years and LDL-C >=70 to <190 mg/dL) who do not have diabetes or established atherosclerotic cardiovascular disease (ASCVD).* In intermediate-risk (10-year ASCVD risk >=7.5% to <20%) adults or selected borderline-risk (10-year ASCVD risk >=5% to <7.5%) adults in whom a CAC score is measured for the purpose of making a treatment decision the following recommendations have been made: If CAC = 0, it is reasonable to withhold statin therapy and reassess in 5 to 10 years,  as long as higher risk conditions are absent (diabetes mellitus, family history of premature CHD in first degree relatives (males <55 years; females <65 years), cigarette smoking, LDL >=190 mg/dL or other independent risk factors). If CAC is 1 to 99, it is reasonable to initiate statin therapy for patients >=48 years of age. If CAC is >=100 or >=75th percentile, it is reasonable to initiate statin therapy at any age. Cardiology referral should be considered for patients with CAC scores >=400 or >=75th percentile. *2018 AHA/ACC/AACVPR/AAPA/ABC/ACPM/ADA/AGS/APhA/ASPC/NLA/PCNA Guideline on the Management of Blood Cholesterol: A Report of the American College of Cardiology/American Heart Association Task Force on Clinical Practice Guidelines. J Am Coll Cardiol. 2019;73(24):3168-3209. Mahesh  Chandrasekhar Electronically Signed   By: Rudean Haskell M.D.   On: 08/12/2022 15:24   Result Date: 08/12/2022 EXAM: OVER-READ INTERPRETATION  CT CHEST The following report is a limited chest CT over-read performed by radiologist Dr. Yetta Glassman of [facility], [place] on 08/12/2022. This over-read does not include interpretation of cardiac or coronary anatomy or pathology. The cardiac TAVR interpretation by the cardiologist is attached. COMPARISON:  None Available. FINDINGS: Extracardiac findings will be described separately under dictation for contemporaneously obtained CTA chest, abdomen and pelvis. IMPRESSION: Please see separate dictation for contemporaneously obtained CTA chest, abdomen and pelvis dated 08/12/2022 for full description of relevant extracardiac findings. Electronically Signed: By: Yetta Glassman M.D. On: 08/12/2022 14:00   CT Angio Abd/Pel w/ and/or w/o  Result Date: 08/12/2022 CLINICAL DATA:  Aortic valve replacement preop evaluation EXAM: CT  ANGIOGRAPHY CHEST, ABDOMEN AND PELVIS TECHNIQUE: Non-contrast CT of the chest was initially obtained. Multidetector CT imaging through the chest,  abdomen and pelvis was performed using the standard protocol during bolus administration of intravenous contrast. Multiplanar reconstructed images and MIPs were obtained and reviewed to evaluate the vascular anatomy. RADIATION DOSE REDUCTION: This exam was performed according to the departmental dose-optimization program which includes automated exposure control, adjustment of the mA and/or kV according to patient size and/or use of iterative reconstruction technique. CONTRAST:  35mL OMNIPAQUE IOHEXOL 350 MG/ML SOLN COMPARISON:  Chest CT dated August 11, 2022 FINDINGS: CTA CHEST FINDINGS Cardiovascular: Normal heart size. Aortic valve thickening and calcifications. No pericardial effusion. Normal caliber thoracic aorta with severe atherosclerotic disease. Severe three-vessel coronary artery calcifications Mediastinum/Nodes: Esophagus and thyroid are unremarkable. No pathologically enlarged lymph nodes seen in the chest. Calcified mediastinal and right hilar lymph nodes, likely sequela of prior granulomatous infection. Lungs/Pleura: Central airways are patent. Bilateral ground-glass opacities which are most pronounced in the upper lobes. Mild septal thickening. Small bilateral pleural effusions and bibasilar atelectasis. Musculoskeletal: No chest wall abnormality. No acute or significant osseous findings. CTA ABDOMEN AND PELVIS FINDINGS Hepatobiliary: No focal liver abnormality is seen. No gallstones, gallbladder wall thickening, or biliary dilatation. Pancreas: Unremarkable. No pancreatic ductal dilatation or surrounding inflammatory changes. Spleen: Normal in size without focal abnormality. Adrenals/Urinary Tract: Bilateral adrenal glands are unremarkable. No hydronephrosis or nephrolithiasis. Excreted contrast material is seen in the bladder. Stomach/Bowel: Stomach is within normal limits. Appendix appears normal. No evidence of bowel wall thickening, distention, or inflammatory changes. Vascular/lymphatic:  Normal caliber abdominal aorta with severe calcified and noncalcified plaque. No pathologically enlarged lymph nodes seen in the abdomen or pelvis. Reproductive: Prostatomegaly with focal area of hyperenhancement of the left prostate measuring 13 x 8 mm on series 4, image 27. Other: No abdominal wall hernia or abnormality. No abdominopelvic ascites. Musculoskeletal: No acute or significant osseous findings. VASCULAR MEASUREMENTS PERTINENT TO TAVR: AORTA: Minimal Aortic Diameter-12.3 mm Severity of Aortic Calcification-severe RIGHT PELVIS: Right Common Iliac Artery - Minimal Diameter-9.8 mm Tortuosity-mild Calcification-moderate Right External Iliac Artery - Minimal Diameter-9.0 mm Tortuosity-moderate Calcification-mild Right Common Femoral Artery - Minimal Diameter-7.2 mm Tortuosity-none Calcification-mild LEFT PELVIS: Left Common Iliac Artery - Minimal Diameter-7.3 mm Tortuosity-mild Calcification-moderate Left External Iliac Artery - Minimal Diameter-9.3 mm Tortuosity-moderate Calcification-mild Left Common Femoral Artery - Minimal Diameter-7.2 mm Tortuosity-none Calcification-moderate Review of the MIP images confirms the above findings. IMPRESSION: 1. Vascular findings and measurements pertinent to potential TAVR procedure, as detailed above. 2. Thickening and calcification of the aortic valve, compatible with reported clinical history of aortic stenosis. 3. 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 4. Bilateral ground-glass opacities which are most pronounced in the upper lobes, likely due to pulmonary edema. 5. Small bilateral pleural effusions and bibasilar atelectasis. 6. Prostatomegaly with focal area of hyperenhancement of the left prostate, recommend correlation with PSA. Electronically Signed   By: Yetta Glassman M.D.   On: 08/12/2022 14:44   CT ANGIO CHEST AORTA W/CM & OR WO/CM  Result Date: 08/12/2022 CLINICAL DATA:  Aortic valve replacement preop  evaluation EXAM: CT ANGIOGRAPHY CHEST, ABDOMEN AND PELVIS TECHNIQUE: Non-contrast CT of the chest was initially obtained. Multidetector CT imaging through the chest, abdomen and pelvis was performed using the standard protocol during bolus administration of intravenous contrast. Multiplanar reconstructed images and MIPs were obtained and reviewed to evaluate the vascular anatomy. RADIATION DOSE REDUCTION: This exam was performed according to the departmental dose-optimization program  which includes automated exposure control, adjustment of the mA and/or kV according to patient size and/or use of iterative reconstruction technique. CONTRAST:  50mL OMNIPAQUE IOHEXOL 350 MG/ML SOLN COMPARISON:  Chest CT dated August 11, 2022 FINDINGS: CTA CHEST FINDINGS Cardiovascular: Normal heart size. Aortic valve thickening and calcifications. No pericardial effusion. Normal caliber thoracic aorta with severe atherosclerotic disease. Severe three-vessel coronary artery calcifications Mediastinum/Nodes: Esophagus and thyroid are unremarkable. No pathologically enlarged lymph nodes seen in the chest. Calcified mediastinal and right hilar lymph nodes, likely sequela of prior granulomatous infection. Lungs/Pleura: Central airways are patent. Bilateral ground-glass opacities which are most pronounced in the upper lobes. Mild septal thickening. Small bilateral pleural effusions and bibasilar atelectasis. Musculoskeletal: No chest wall abnormality. No acute or significant osseous findings. CTA ABDOMEN AND PELVIS FINDINGS Hepatobiliary: No focal liver abnormality is seen. No gallstones, gallbladder wall thickening, or biliary dilatation. Pancreas: Unremarkable. No pancreatic ductal dilatation or surrounding inflammatory changes. Spleen: Normal in size without focal abnormality. Adrenals/Urinary Tract: Bilateral adrenal glands are unremarkable. No hydronephrosis or nephrolithiasis. Excreted contrast material is seen in the bladder.  Stomach/Bowel: Stomach is within normal limits. Appendix appears normal. No evidence of bowel wall thickening, distention, or inflammatory changes. Vascular/lymphatic: Normal caliber abdominal aorta with severe calcified and noncalcified plaque. No pathologically enlarged lymph nodes seen in the abdomen or pelvis. Reproductive: Prostatomegaly with focal area of hyperenhancement of the left prostate measuring 13 x 8 mm on series 4, image 27. Other: No abdominal wall hernia or abnormality. No abdominopelvic ascites. Musculoskeletal: No acute or significant osseous findings. VASCULAR MEASUREMENTS PERTINENT TO TAVR: AORTA: Minimal Aortic Diameter-12.3 mm Severity of Aortic Calcification-severe RIGHT PELVIS: Right Common Iliac Artery - Minimal Diameter-9.8 mm Tortuosity-mild Calcification-moderate Right External Iliac Artery - Minimal Diameter-9.0 mm Tortuosity-moderate Calcification-mild Right Common Femoral Artery - Minimal Diameter-7.2 mm Tortuosity-none Calcification-mild LEFT PELVIS: Left Common Iliac Artery - Minimal Diameter-7.3 mm Tortuosity-mild Calcification-moderate Left External Iliac Artery - Minimal Diameter-9.3 mm Tortuosity-moderate Calcification-mild Left Common Femoral Artery - Minimal Diameter-7.2 mm Tortuosity-none Calcification-moderate Review of the MIP images confirms the above findings. IMPRESSION: 1. Vascular findings and measurements pertinent to potential TAVR procedure, as detailed above. 2. Thickening and calcification of the aortic valve, compatible with reported clinical history of aortic stenosis. 3. 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 4. Bilateral ground-glass opacities which are most pronounced in the upper lobes, likely due to pulmonary edema. 5. Small bilateral pleural effusions and bibasilar atelectasis. 6. Prostatomegaly with focal area of hyperenhancement of the left prostate, recommend correlation with PSA. Electronically Signed   By:  Yetta Glassman M.D.   On: 08/12/2022 14:44   CT CHEST WO CONTRAST  Result Date: 08/11/2022 CLINICAL DATA:  Aortic atherosclerosis, aortic stenosis, pre preop aortic assessment. EXAM: CT CHEST WITHOUT CONTRAST TECHNIQUE: Multidetector CT imaging of the chest was performed following the standard protocol without IV contrast. RADIATION DOSE REDUCTION: This exam was performed according to the departmental dose-optimization program which includes automated exposure control, adjustment of the mA and/or kV according to patient size and/or use of iterative reconstruction technique. COMPARISON:  None Available. FINDINGS: Cardiovascular: The heart is normal in size and there is no significant pericardial effusion. Three-vessel coronary artery calcifications are noted. There is atherosclerotic calcification of the aorta and aortic valve annulus. The ascending aorta measures 3.6 cm in diameter, the aortic arch measures 2.9 cm in the descending aorta measures 2.5 cm. The pulmonary trunk is normal in caliber. Mediastinum/Nodes: Calcified lymph nodes are noted in the mediastinum  and right hilum. Few prominent lymph nodes are noted in the mediastinum measuring up to 1 cm in the subcarinal space. No axillary lymphadenopathy. Evaluation of the hilar regions is limited due to lack of IV contrast. The thyroid gland, trachea, and esophagus are within normal limits. Lungs/Pleura: There are small bilateral pleural effusions. Interstitial prominence and patchy airspace disease is noted bilaterally. Subpleural reticulations are present bilaterally. No pneumothorax. Upper Abdomen: Renal calculi are noted bilaterally. No acute abnormality. Musculoskeletal: Degenerative changes are present in the thoracic spine. No acute osseous abnormality. IMPRESSION: 1. Aortic atherosclerosis without evidence of aneurysm. 2. Coronary artery calcifications and atherosclerotic calcification of the aortic annulus. 3. Interstitial and airspace opacities  bilaterally, possible edema versus pneumonitis. 4. Small bilateral pleural effusions. Electronically Signed   By: Brett Fairy M.D.   On: 08/11/2022 21:11   VAS US DOPPLER PRE CABG  Result Date: 08/11/2022 PREOPERATIVE VASCULAR EVALUATION Patient Name:  Jonathan Romero  Date of Exam:   08/11/2022 Medical Rec #: [id number]       Accession #:    [id number] Date of Birth: [birth year]       Patient Gender: M Patient Age:   27 years Exam Location:  [facility] Procedure:      VAS US DOPPLER PRE CABG Referring Phys: Coralie Common --------------------------------------------------------------------------------  Indications:      Pre-CABG. Risk Factors:     Hyperlipidemia, coronary artery disease. Other Factors:    Severe aortic stenosis. Limitations:      Status post radial cath today. Bandages, IV Comparison Study: No prior study Performing Technologist: Sharion Dove RVS  Examination Guidelines: A complete evaluation includes B-mode imaging, spectral Doppler, color Doppler, and power Doppler as needed of all accessible portions of each vessel. Bilateral testing is considered an integral part of a complete examination. Limited examinations for reoccurring indications may be performed as noted.  Right Carotid Findings: +----------+--------+--------+--------+------------+------------------+           PSV cm/sEDV cm/sStenosisDescribe    Comments           +----------+--------+--------+--------+------------+------------------+ CCA Prox  57      14                          intimal thickening +----------+--------+--------+--------+------------+------------------+ CCA Distal65      12                          intimal thickening +----------+--------+--------+--------+------------+------------------+ ICA Prox  72      11              heterogenous                   +----------+--------+--------+--------+------------+------------------+ ICA Mid   53      18                                              +----------+--------+--------+--------+------------+------------------+ ICA Distal78      24                                             +----------+--------+--------+--------+------------+------------------+ ECA       81      11                                             +----------+--------+--------+--------+------------+------------------+ +----------+--------+-------+--------+------------+  PSV cm/sEDV cmsDescribeArm Pressure +----------+--------+-------+--------+------------+ Subclavian98                                  +----------+--------+-------+--------+------------+ +---------+--------+--+--------+--+ VertebralPSV cm/s52EDV cm/s14 +---------+--------+--+--------+--+ Left Carotid Findings: +----------+--------+--------+--------+------------+------------------+           PSV cm/sEDV cm/sStenosisDescribe    Comments           +----------+--------+--------+--------+------------+------------------+ CCA Prox  74      12                          intimal thickening +----------+--------+--------+--------+------------+------------------+ CCA Distal80      16                          intimal thickening +----------+--------+--------+--------+------------+------------------+ ICA Prox  97      25              heterogenous                   +----------+--------+--------+--------+------------+------------------+ ICA Mid   87      26                                             +----------+--------+--------+--------+------------+------------------+ ICA Distal78      21                                             +----------+--------+--------+--------+------------+------------------+ ECA       106     10                                             +----------+--------+--------+--------+------------+------------------+  +----------+--------+--------+---------+------------+ SubclavianPSV cm/sEDV cm/sDescribe Arm  Pressure +----------+--------+--------+---------+------------+           233             Turbulent             +----------+--------+--------+---------+------------+ +---------+--------+--+--------+--+ VertebralPSV cm/s55EDV cm/s15 +---------+--------+--+--------+--+  Right Doppler Findings: +------+--------+-----+---------+--------+ Site  PressureIndexDoppler  Comments +------+--------+-----+---------+--------+ Radial             triphasic         +------+--------+-----+---------+--------+ Ulnar              triphasic         +------+--------+-----+---------+--------+  Left Doppler Findings: +------+--------+-----+---------+--------+ Site  PressureIndexDoppler  Comments +------+--------+-----+---------+--------+ Radial             triphasic         +------+--------+-----+---------+--------+ Ulnar              triphasic         +------+--------+-----+---------+--------+  Technologist Notes: Unable to perform Allen's test secondary to radial cath done today.  Summary: Right Carotid: The extracranial vessels were near-normal with only minimal wall                thickening or plaque. Left Carotid: The extracranial vessels were near-normal with only minimal wall               thickening or  plaque. Vertebrals:  Bilateral vertebral arteries demonstrate antegrade flow. Subclavians: Left subclavian artery flow was disturbed. Normal flow hemodynamics              were seen in the right subclavian artery. Right Upper Extremity: Doppler waveforms decrease <50% with right radial compression. Doppler waveforms decrease >50% with right ulnar compression.  Electronically signed by Servando Snare MD on 08/11/2022 at 5:32:04 PM.    Final    CARDIAC CATHETERIZATION  Addendum Date: 08/11/2022   CONCLUSIONS: Severe calcific aortic stenosis with calculated aortic valve area less than 0.75 cm. 95% proximal to mid LAD followed by 70% further down the vessel.  The proximal lesion is  within a calcified segment. 70 to 80% mid nondominant RCA which supplies faint collaterals to the LAD. Mild pulmonary hypertension.  Capillary wedge pressure 15 mmHg. RECOMMENDATIONS: Surgical aortic valve replacement and LIMA to LAD and possibly the acute marginal branch of the RCA.  Result Date: 08/11/2022   Mid LAD lesion is 70% stenosed.   Prox LAD to Mid LAD lesion is 95% stenosed.   1st Sept lesion is 70% stenosed.   Prox RCA lesion is 75% stenosed.   There is mild left ventricular systolic dysfunction.   LV end diastolic pressure is normal.   LV end diastolic pressure is normal.   The left ventricular ejection fraction is 45-50% by visual estimate.   Hemodynamic findings consistent with mild pulmonary hypertension and aortic valve stenosis.   There is severe aortic valve stenosis.    Cardiac Studies   Cath: 08/11/22   CONCLUSIONS: Severe calcific aortic stenosis with calculated aortic valve area less than 0.75 cm. 95% proximal to mid LAD followed by 70% further down the vessel.  The proximal lesion is within a calcified segment. 70 to 80% mid nondominant RCA which supplies faint collaterals to the LAD. Mild pulmonary hypertension.  Capillary wedge pressure 15 mmHg.   RECOMMENDATIONS: Surgical aortic valve replacement and LIMA to LAD and possibly the acute marginal branch of the RCA.   Diagnostic Dominance: Left  Echo: 08/10/22   IMPRESSIONS     1. Left ventricular ejection fraction, by estimation, is 40 to 45%. The  left ventricle has mildly decreased function. The left ventricle  demonstrates global hypokinesis. Left ventricular diastolic parameters are  consistent with Grade III diastolic  dysfunction (restrictive). Elevated left ventricular end-diastolic  pressure.   2. Right ventricular systolic function is normal. The right ventricular  size is normal. There is mildly elevated pulmonary artery systolic  pressure. The estimated right ventricular systolic pressure is 123XX123  mmHg.   3. Left atrial size was mildly dilated.   4. The mitral valve is degenerative. Mild mitral valve regurgitation. No  evidence of mitral stenosis. The mean mitral valve gradient is 3.0 mmHg.   5. The aortic valve has an indeterminant number of cusps. There is severe  calcifcation of the aortic valve. There is severe thickening of the aortic  valve. Aortic valve regurgitation is not visualized. Severe aortic valve  stenosis. Aortic valve area, by   VTI measures 0.53 cm. Aortic valve mean gradient measures 45.0 mmHg.  Aortic valve Vmax measures 4.10 m/s.   6. The inferior vena cava is dilated in size with <50% respiratory  variability, suggesting right atrial pressure of 15 mmHg.   FINDINGS   Left Ventricle: Left ventricular ejection fraction, by estimation, is 40  to 45%. The left ventricle has mildly decreased function. The left  ventricle demonstrates global hypokinesis. The left ventricular  internal  cavity size was normal in size. There is   no left ventricular hypertrophy. Left ventricular diastolic parameters  are consistent with Grade III diastolic dysfunction (restrictive).  Elevated left ventricular end-diastolic pressure.   Right Ventricle: The right ventricular size is normal. No increase in  right ventricular wall thickness. Right ventricular systolic function is  normal. There is mildly elevated pulmonary artery systolic pressure. The  tricuspid regurgitant velocity is 2.36   m/s, and with an assumed right atrial pressure of 15 mmHg, the estimated  right ventricular systolic pressure is 123XX123 mmHg.   Left Atrium: Left atrial size was mildly dilated.   Right Atrium: Right atrial size was normal in size.   Pericardium: There is no evidence of pericardial effusion.   Mitral Valve: The mitral valve is degenerative in appearance. There is  moderate thickening of the mitral valve leaflet(s). There is mild  calcification of the mitral valve leaflet(s). Mild to moderate  mitral  annular calcification. Mild mitral valve  regurgitation. No evidence of mitral valve stenosis. MV peak gradient, 9.5  mmHg. The mean mitral valve gradient is 3.0 mmHg.   Tricuspid Valve: The tricuspid valve is normal in structure. Tricuspid  valve regurgitation is not demonstrated. No evidence of tricuspid  stenosis.   Aortic Valve: The aortic valve has an indeterminant number of cusps. There  is severe calcifcation of the aortic valve. There is severe thickening of  the aortic valve. Aortic valve regurgitation is not visualized. Severe  aortic stenosis is present. Aortic   valve mean gradient measures 45.0 mmHg. Aortic valve peak gradient  measures 67.2 mmHg. Aortic valve area, by VTI measures 0.53 cm.   Pulmonic Valve: The pulmonic valve was normal in structure. Pulmonic valve  regurgitation is not visualized. No evidence of pulmonic stenosis.   Aorta: The aortic root is normal in size and structure.   Venous: The inferior vena cava is dilated in size with less than 50%  respiratory variability, suggesting right atrial pressure of 15 mmHg.   IAS/Shunts: No atrial level shunt detected by color flow Doppler.    Patient Profile     76 y.o. male with PMH of HLD and moderate to severe AS who presented with chest pain.    Assessment & Plan    NSTEMI -- High-sensitivity troponin peaked at 2108.  Underwent cardiac catheterization noted above with calcified proximal/mid 95% LAD stenosis followed by 70% LAD stenosis, nondominant RCA with 85% mid stenosis marginal branch of RCA supplying collaterals to mid LAD.  Severe aortic valve stenosis with peak to peak gradient 67 mmHg and mean gradient of 53 mmHg.  TCTS consulted for possible CABG/AVR but due to significant ascending aortic calcification he is not felt to be a candidate for surgery. Planned for cath with PCI of LAD today. Received plavix 300mg  load 11/21.  -- Continue aspirin, plavix, Toprol 25mg  daily and IV heparin    HFrEF ICM -- LVEF of 40-45% global hypokinesis, grade 3 diastolic dysfunction, normal RV size and function -- GDMT limited in the setting of soft blood pressure, tolerating Toprol XL 25mg  daily. Consider addition of low dose ARB post cath, +/- spiro   Severe AS Mild MR Syncope -- Echo 11/19 aortic valve mean gradient measures 45 mmHg, aortic valve peak gradient measures 67.2 mmHg, aortic valve V-max 4.1 m/s -- Cath noted above with severe aortic valve stenosis with peak to peak gradient of 67 mmHg and mean gradient of 53 mmHg -- As above evaluated by TCTS, not  felt to be a candidate for surgery given aortic calcifications. Plan for potential TAVR next week   HLD -- History of statin intolerant, has tried several different types with significant myalgias and fatigue.  Also had issues with Zetia -- Would plan for outpatient lipid clinic referral for consideration of [id number]  For questions or updates, please contact Ruby Please consult [email or web address] for contact info under        Signed, Reino Bellis, NP  08/13/2022, 7:48 AM

## 2022-08-13 NOTE — Progress Notes (Incomplete)
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 in 1988 was the small LDL particle number,which is very high- not tinterested in taking medications   Systolic murmur      Social History   Socioeconomic History   Marital status: Married    Spouse name: Henrita   Number of children: 3   Years of education: Not on file   Highest education level: High school graduate  Occupational History   Occupation: retired  Tobacco Use   Smoking status: Former    Types: Cigarettes    Quit date: 04/15/1983    Years since quitting: 39.3   Smokeless tobacco: Never  Vaping Use   Vaping Use: Never used  Substance and Sexual Activity   Alcohol use: Yes    Comment: beer occasionally   Drug use: No   Sexual activity: Not on file  Other Topics Concern   Not on file  Social History Narrative   Not on file   Social Determinants of Health   Financial Resource Strain: Low Risk  (08/13/2022)   Overall Financial Resource Strain (CARDIA)    Difficulty of Paying Living Expenses: Not hard at all  Food Insecurity: No Food Insecurity (08/10/2022)   Hunger Vital Sign    Worried About Running Out of Food in the Last Year: Never true    Ran Out of Food in the Last Year: Never true  Transportation Needs: No Transportation Needs (08/13/2022)   PRAPARE - Administrator, Civil Service (Medical): No    Lack of Transportation (Non-Medical): No  Physical Activity: Not on file  Stress: Not on file  Social Connections: Not on file    High Risk Criteria for Readmission and/or Poor Patient Outcomes: Heart failure hospital admissions (last 6 months): ***  No Show rate:  *** Difficult social situation: *** Demonstrates medication adherence: *** Primary Language: *** Literacy level: ***  Barriers of Care:   ***  Considerations/Referrals:   Referral made to Heart Failure Pharmacist Stewardship: *** Referral made to Heart Failure CSW/NCM TOC: *** Referral made to Heart & Vascular TOC clinic: ***  Items for Follow-up on DC/TOC: ***   ***

## 2022-08-13 NOTE — Interval H&P Note (Signed)
History and Physical Interval Note:  08/13/2022 4:25 PM  Jonathan Romero  has presented today for surgery, with the diagnosis of nstemi.  The various methods of treatment have been discussed with the patient and family. After consideration of risks, benefits and other options for treatment, the patient has consented to  Procedure(s): CORONARY STENT INTERVENTION (N/A) as a surgical intervention.  The patient's history has been reviewed, patient examined, no change in status, stable for surgery.  I have reviewed the patient's chart and labs.  Questions were answered to the patient's satisfaction.     Tonny Bollman

## 2022-08-13 NOTE — Consult Note (Addendum)
[place] VALVE TEAM  Cardiology Consultation:   Patient ID: Jonathan Romero MRN: [id number]; DOB: 09-26-1945  Admit date: 08/09/2022 Date of Consult: 08/13/2022  Primary Care Provider: Sharilyn Sites, MD [facility] Cardiologist: None  [facility] Electrophysiologist:  None    Patient Profile:   Jonathan Romero is a 76 y.o. male with limited known medical history of HLD due to not receiving regular medical care. He presented to the ED for chest pain, SOB, and syncope. He was found to have severe AS, NSTEMI, SVT, and CAD. He is being seen today for the evaluation of TAVR at the request of Dr. Lavonna Monarch.   History of Present Illness:   Mr. Jonathan Romero is a 76 year old male with a history of HLD and a remote smoking history. He does not receive regular medical care. He was carrying a 30lb box of food from his porch to inside his house on 08/09/22 and had a sudden onset of SOB and heartburn over the left side of his chest. His wife then witnessed an acute loss of consciousness which she thought was seizure like activity. He fell but did not hit his head. He regained consciousness quickly and had associated diaphoresis and a cold/clammy feeling. His wife then called 911 but hung up at the request of the patient who was adamantly refusing to be evaluated. Later on in the day, the patients wife witnessed another loss of consciousness while the patient was climbing stairs. This syncope episode was associated with left arm numbness and diaphoresis. The patient fell and hit his right arm but continued to avoid head trauma. His wife called 911 again and EMS responded and found the patient in SVT with a reported HR of 280. He was then cardioverted into NSR.   Upon arrival to the ED he was found to have a heart murmur, positive troponin, NSTEMI. Echo was performed 08/10/22 to evaluate murmur with findings of  severe AS. With a mean gradient of 45.0 mmHg, peak  gradient of 67.33mmHg, and AVA 0.53 cm2. EF was 40-45%. The patient was taken for [facility] 08/11/22 to evaluate CAD with findings of a 95% calcified LAD lesion. No intervention was done at that time and a consult was made to cardiac surgery to evaluate for SAVR and CABG.   The patient was not found to be a surgical candidate due to significant aortic calcification found on cardiac CTA. The patient will be going for PCI to the LAD today 08/13/22 and has been scheduled for TAVR next week.   [name] is very active at home and does yard work and other projects without issue. Before this admission and his syncope episodes he has noticed progressive fatigue for the last month but dismissed it as "getting old". He does not have regular dental cleanings. He has partial upper dentures and most of his bottom teeth are missing but those that remain appear to be in good condition. He denies any tooth or gum discomfort.    Past Medical History:  Diagnosis Date   Cellulitis    Dyslipidemia    Hyperlipidemia 2013   LDL particle number [id number] with LDL CALULATED at 151 and 2300 in 1988 was the small LDL particle number,which is very high- not tinterested in taking medications   Systolic murmur     Past Surgical History:  Procedure Laterality Date   CATARACT EXTRACTION     DOPPLER ECHOCARDIOGRAPHY  12/21/2012   EF 55-60%,   DOPPLER  ECHOCARDIOGRAPHY  12/16/2011   MILD AORTIC STENOSIS,PEAK AND MEAN GRADIENTS OF 18 AND 8 mmHg and a valve area of around 2 square cm.    RIGHT/LEFT HEART CATH AND CORONARY ANGIOGRAPHY N/A 08/11/2022   Procedure: RIGHT/LEFT HEART CATH AND CORONARY ANGIOGRAPHY;  Surgeon: Belva Crome, MD;  Location: [place] [facility];  Service: Cardiovascular;  Laterality: N/A;     Home Medications:  Prior to Admission medications   Medication Sig Start Date End Date Taking? Authorizing Provider  aspirin EC 325 MG tablet Take 325 mg by mouth once.   Yes [provider]  aspirin EC 81 MG  EC tablet Take 1 tablet (81 mg total) by mouth daily. 11/26/18  Yes Lavina Hamman, MD  hydrochlorothiazide (HYDRODIURIL) 25 MG tablet Take 1 tablet (25 mg total) by mouth daily. Patient not taking: Reported on 08/09/2022 11/26/18   Lavina Hamman, MD    Inpatient Medications: Scheduled Meds:  aspirin EC  81 mg Oral Daily   clopidogrel  75 mg Oral Daily   metoprolol succinate  25 mg Oral Daily   sodium chloride flush  3 mL Intravenous Q12H   sodium chloride flush  3 mL Intravenous Q12H   Continuous Infusions:  sodium chloride Stopped (08/11/22 1319)   sodium chloride     sodium chloride 1 mL/kg/hr (08/13/22 0723)   heparin 1,650 Units/hr (08/12/22 2357)   PRN Meds: sodium chloride, sodium chloride, acetaminophen, nitroGLYCERIN, ondansetron (ZOFRAN) IV, oxyCODONE, sodium chloride flush, sodium chloride flush  Allergies:    Allergies  Allergen Reactions   Statins     Social History:   Social History   Socioeconomic History   Marital status: Married    Spouse name: Not on file   Number of children: Not on file   Years of education: Not on file   Highest education level: Not on file  Occupational History   Not on file  Tobacco Use   Smoking status: Former    Types: Cigarettes    Quit date: 04/15/1983    Years since quitting: 39.3   Smokeless tobacco: Never  Substance and Sexual Activity   Alcohol use: Yes    Comment: beer occasionally   Drug use: No   Sexual activity: Not on file  Other Topics Concern   Not on file  Social History Narrative   Not on file   Social Determinants of Health   Financial Resource Strain: Not on file  Food Insecurity: No Food Insecurity (08/10/2022)   Hunger Vital Sign    Worried About Running Out of Food in the Last Year: Never true    Ran Out of Food in the Last Year: Never true  Transportation Needs: No Transportation Needs (08/10/2022)   PRAPARE - Hydrologist (Medical): No    Lack of Transportation  (Non-Medical): No  Physical Activity: Not on file  Stress: Not on file  Social Connections: Not on file  Intimate Partner Violence: Not At Risk (08/10/2022)   Humiliation, Afraid, Rape, and Kick questionnaire    Fear of Current or Ex-Partner: No    Emotionally Abused: No    Physically Abused: No    Sexually Abused: No    Family History:    Family History  Problem Relation Age of Onset   Diabetes Mother    Heart failure Father      ROS:  Please see the history of present illness.   All other ROS reviewed and negative.  Physical Exam/Data:   Vitals:   08/13/22 0419 08/13/22 0553 08/13/22 0816 08/13/22 0921  BP: 121/70  (!) 131/93 (!) 143/73  Pulse: 84  91 78  Resp:   19 18  Temp: 98.7 F (37.1 C)  98.7 F (37.1 C)   TempSrc: Oral  Oral   SpO2: 95%  100% 98%  Weight:  71.3 kg    Height:        Intake/Output Summary (Last 24 hours) at 08/13/2022 0953 Last data filed at 08/13/2022 [id number] Gross per 24 hour  Intake 490 ml  Output --  Net 490 ml      08/13/2022    5:53 AM 08/13/2022   12:45 AM 08/12/2022    5:35 AM  Last 3 Weights  Weight (lbs) 157 lb 3 oz 157 lb 3.2 oz 156 lb 6.4 oz  Weight (kg) 71.3 kg 71.305 kg 70.943 kg     Body mass index is 22.55 kg/m.  General:  Well nourished, well developed, in no acute distress Neck: no JVD  Cardiac:  normal S1, S2; RRR; loud harsh systolic murmur.  Lungs:  clear to auscultation bilaterally, no wheezing, rhonchi or rales  Ext: mild +1 edema, bilateral  Skin: warm and dry  Neuro:  AOOx3 Psych:  Normal affect   EKG:  The EKG  08/09/22 was personally reviewed and demonstrates:  NSR  Telemetry:  Telemetry was personally reviewed and demonstrates:  NSR  Relevant CV Studies: Echo 08/10/22 1. Left ventricular ejection fraction, by estimation, is 40 to 45%. The  left ventricle has mildly decreased function. The left ventricle  demonstrates global hypokinesis. Left ventricular diastolic parameters are  consistent  with Grade III diastolic  dysfunction (restrictive). Elevated left ventricular end-diastolic  pressure.   2. Right ventricular systolic function is normal. The right ventricular  size is normal. There is mildly elevated pulmonary artery systolic  pressure. The estimated right ventricular systolic pressure is 123XX123 mmHg.   3. Left atrial size was mildly dilated.   4. The mitral valve is degenerative. Mild mitral valve regurgitation. No  evidence of mitral stenosis. The mean mitral valve gradient is 3.0 mmHg.   5. The aortic valve has an indeterminant number of cusps. There is severe  calcifcation of the aortic valve. There is severe thickening of the aortic  valve. Aortic valve regurgitation is not visualized. Severe aortic valve  stenosis. Aortic valve area, by   VTI measures 0.53 cm. Aortic valve mean gradient measures 45.0 mmHg.  Aortic valve Vmax measures 4.10 m/s.   6. The inferior vena cava is dilated in size with <50% respiratory  variability, suggesting right atrial pressure of 15 mmHg.   L/RHC 08/11/22  CONCLUSIONS: Severe calcific aortic stenosis with calculated aortic valve area less than 0.75 cm. 95% proximal to mid LAD followed by 70% further down the vessel.  The proximal lesion is within a calcified segment. 70 to 80% mid nondominant RCA which supplies faint collaterals to the LAD. Mild pulmonary hypertension.  Capillary wedge pressure 15 mmHg.   RECOMMENDATIONS: Surgical aortic valve replacement and LIMA to LAD and possibly the acute marginal branch of the RCA.   Cardiac CTA 08/12/22  FINDINGS: Aortic Valve: Severely thickened tri-leaflet aortic valve with heavy calcification and reduced excursion the planimeter valve area is 0.924 Sq cm consistent with severe aortic stenosis   Number of leaflets: 3   LVOT calcification: Presence of LVOT calcification into the intervalvular fibrosa   Annular calcification: Severe due to presence of LVOT  calcification, moderate otherwise   Aortic Valve Calcium Score: 2749  Laboratory Data:  High Sensitivity Troponin:   Recent Labs  Lab 08/09/22 1841 08/09/22 2047  TROPONINIHS 2,108* 2,044*     Chemistry Recent Labs  Lab 08/09/22 1841 08/11/22 0047 08/11/22 0813 08/11/22 0814 08/11/22 0824 08/12/22 0043  NA 139 141   < > 142 140 138  K 4.6 3.8   < > 3.7 3.8 4.0  CL 106 109  --   --   --  108  CO2 22 23  --   --   --  21*  GLUCOSE 148* 147*  --   --   --  106*  BUN 25* 24*  --   --   --  18  CREATININE 1.12 1.11  --   --   --  0.90  CALCIUM 8.5* 8.5*  --   --   --  8.1*  GFRNONAA >60 >60  --   --   --  >60  ANIONGAP 11 9  --   --   --  9   < > = values in this interval not displayed.    Recent Labs  Lab 08/09/22 1841  PROT 5.7*  ALBUMIN 3.0*  AST 40  ALT 19  ALKPHOS 75  BILITOT 1.6*   Hematology Recent Labs  Lab 08/11/22 0047 08/11/22 0813 08/11/22 0824 08/12/22 0043 08/13/22 0129  WBC 9.2  --   --  10.3 8.6  RBC 3.81*  --   --  3.36* 3.67*  HGB 12.8*   < > 11.9* 11.6* 12.2*  HCT 38.6*   < > 35.0* 33.9* 36.3*  MCV 101.3*  --   --  100.9* 98.9  MCH 33.6  --   --  34.5* 33.2  MCHC 33.2  --   --  34.2 33.6  RDW 13.4  --   --  13.6 13.2  PLT 147*  --   --  127* 138*   < > = values in this interval not displayed.   BNP Recent Labs  Lab 08/09/22 1843  BNP 486.3*     Radiology/Studies:  CT CORONARY MORPH W/CTA COR W/SCORE W/CA W/CM &/OR WO/CM  Addendum Date: 08/12/2022   ADDENDUM REPORT: 08/12/2022 15:24 CLINICAL DATA:  Aortic Valve pathology with assessment for TAVR EXAM: Cardiac TAVR CT TECHNIQUE: The patient was scanned on a Siemens Force 192 slice scanner. A 120 kV retrospective scan was triggered in the descending thoracic aorta at 111 HU's. Gantry rotation speed was 270 msecs and collimation was .9 mm. No beta blockade or nitro were given. The 3D data set was reconstructed in 5% intervals of the R-R cycle. Systolic and diastolic phases were  analyzed on a dedicated work station using MPR, MIP and VRT modes. The patient received 95 cc of contrast. FINDINGS: Aortic Valve: Severely thickened tri-leaflet aortic valve with heavy calcification and reduced excursion the planimeter valve area is 0.924 Sq cm consistent with severe aortic stenosis Number of leaflets: 3 LVOT calcification: Presence of LVOT calcification into the intervalvular fibrosa Annular calcification: Severe due to presence of LVOT calcification, moderate otherwise Aortic Valve Calcium Score: 2749 Presence of basal septal hypertrophy: Non severe Perimembranous septal diameter: 7 mm Mitral Valve: No significant mitral annular calcification Aortic Annulus Measurements- 30% phase Major annulus diameter: 29 mm Minor annulus diameter: 21 mm Annular perimeter: 79 mm Annular area: 4.67 cm2 Aortic Root Measurements- 70% phase Sinotubular Junction: 30 mm Ascending Thoracic Aorta: 36 mm Aortic Arch: 28  mm Descending Thoracic Aorta: 25 mm Aortic atherosclerosis. Sinus of Valsalva Measurements: Right coronary cusp width: 29 mm Left coronary cusp width: 29 mm Non coronary cusp width: 28 mm Coronary Artery Height above Annulus: Left Main: 13 mm Left SoV height: 20 mm Right Coronary: 14 mm Right SoV height: 20 mm Optimum Fluoroscopic Angle for Delivery: LAO 5, CAU 8 Cusp overlay view angle: RAO 0 CAU 11 Valves for structural team consideration: 26 mm Sapien Valve 29 mm Evolut Non TAVR Valve Findings: Coronary Arteries: Normal coronary origin. Study not completed with nitroglycerin. Coronary Calcium Score: Left main: 504 Left anterior descending artery: 951 Left circumflex artery: 237 Right coronary artery: 34 Total: 1726 Percentile: 83rd for age, sex, and race matched control. Systemic veins: Normal anatomy Main Pulmonary artery: Normal caliber Pulmonary veins: Normal anatomy Left atrial appendage: Patent Interatrial septum: No communications Left ventricle: Normal size Left atrium: Mild dilation Right  ventricle: Normal size Right atrium: Mild dilation Pericardium: Trivial anterior pericardial effusion Extra Cardiac Findings as per separate reporting. IMPRESSION: 1. Severe Aortic stenosis. Findings pertinent to TAVR procedure are detailed above. RECOMMENDATIONS: The proposed cut-off value of 1,651 AU yielded a 93 % sensitivity and 75 % specificity in grading AS severity in patients with classical low-flow, low-gradient AS. Proposed different cut-off values to define severe AS for men and women as 2,065 AU and 1,274 AU, respectively. The joint European and American recommendations for the assessment of AS consider the aortic valve calcium score as a continuum - a very high calcium score suggests severe AS and a low calcium score suggests severe AS is unlikely. Kerman Passey, et al. 2017 ESC/EACTS Guidelines for the management of valvular heart disease. Eur Heart J [phone] Coronary artery calcium (CAC) score is a strong predictor of incident coronary heart disease (CHD) and provides predictive information beyond traditional risk factors. CAC scoring is reasonable to use in the decision to withhold, postpone, or initiate statin therapy in intermediate-risk or selected borderline-risk asymptomatic adults (age 82-75 years and LDL-C >=70 to <190 mg/dL) who do not have diabetes or established atherosclerotic cardiovascular disease (ASCVD).* In intermediate-risk (10-year ASCVD risk >=7.5% to <20%) adults or selected borderline-risk (10-year ASCVD risk >=5% to <7.5%) adults in whom a CAC score is measured for the purpose of making a treatment decision the following recommendations have been made: If CAC = 0, it is reasonable to withhold statin therapy and reassess in 5 to 10 years, as long as higher risk conditions are absent (diabetes mellitus, family history of premature CHD in first degree relatives (males <55 years; females <65 years), cigarette smoking, LDL >=190 mg/dL or other independent risk  factors). If CAC is 1 to 99, it is reasonable to initiate statin therapy for patients >=47 years of age. If CAC is >=100 or >=75th percentile, it is reasonable to initiate statin therapy at any age. Cardiology referral should be considered for patients with CAC scores >=400 or >=75th percentile. *2018 AHA/ACC/AACVPR/AAPA/ABC/ACPM/ADA/AGS/APhA/ASPC/NLA/PCNA Guideline on the Management of Blood Cholesterol: A Report of the American College of Cardiology/American Heart Association Task Force on Clinical Practice Guidelines. J Am Coll Cardiol. 2019;73(24):3168-3209. Mahesh  Chandrasekhar Electronically Signed   By: Rudean Haskell M.D.   On: 08/12/2022 15:24   Result Date: 08/12/2022 EXAM: OVER-READ INTERPRETATION  CT CHEST The following report is a limited chest CT over-read performed by radiologist Dr. Yetta Glassman of [facility], [place] on 08/12/2022. This over-read does not include interpretation of cardiac or coronary anatomy or pathology.  The cardiac TAVR interpretation by the cardiologist is attached. COMPARISON:  None Available. FINDINGS: Extracardiac findings will be described separately under dictation for contemporaneously obtained CTA chest, abdomen and pelvis. IMPRESSION: Please see separate dictation for contemporaneously obtained CTA chest, abdomen and pelvis dated 08/12/2022 for full description of relevant extracardiac findings. Electronically Signed: By: Yetta Glassman M.D. On: 08/12/2022 14:00   CT Angio Abd/Pel w/ and/or w/o  Result Date: 08/12/2022 CLINICAL DATA:  Aortic valve replacement preop evaluation EXAM: CT ANGIOGRAPHY CHEST, ABDOMEN AND PELVIS TECHNIQUE: Non-contrast CT of the chest was initially obtained. Multidetector CT imaging through the chest, abdomen and pelvis was performed using the standard protocol during bolus administration of intravenous contrast. Multiplanar reconstructed images and MIPs were obtained and reviewed to evaluate the vascular anatomy.  RADIATION DOSE REDUCTION: This exam was performed according to the departmental dose-optimization program which includes automated exposure control, adjustment of the mA and/or kV according to patient size and/or use of iterative reconstruction technique. CONTRAST:  75mL OMNIPAQUE IOHEXOL 350 MG/ML SOLN COMPARISON:  Chest CT dated August 11, 2022 FINDINGS: CTA CHEST FINDINGS Cardiovascular: Normal heart size. Aortic valve thickening and calcifications. No pericardial effusion. Normal caliber thoracic aorta with severe atherosclerotic disease. Severe three-vessel coronary artery calcifications Mediastinum/Nodes: Esophagus and thyroid are unremarkable. No pathologically enlarged lymph nodes seen in the chest. Calcified mediastinal and right hilar lymph nodes, likely sequela of prior granulomatous infection. Lungs/Pleura: Central airways are patent. Bilateral ground-glass opacities which are most pronounced in the upper lobes. Mild septal thickening. Small bilateral pleural effusions and bibasilar atelectasis. Musculoskeletal: No chest wall abnormality. No acute or significant osseous findings. CTA ABDOMEN AND PELVIS FINDINGS Hepatobiliary: No focal liver abnormality is seen. No gallstones, gallbladder wall thickening, or biliary dilatation. Pancreas: Unremarkable. No pancreatic ductal dilatation or surrounding inflammatory changes. Spleen: Normal in size without focal abnormality. Adrenals/Urinary Tract: Bilateral adrenal glands are unremarkable. No hydronephrosis or nephrolithiasis. Excreted contrast material is seen in the bladder. Stomach/Bowel: Stomach is within normal limits. Appendix appears normal. No evidence of bowel wall thickening, distention, or inflammatory changes. Vascular/lymphatic: Normal caliber abdominal aorta with severe calcified and noncalcified plaque. No pathologically enlarged lymph nodes seen in the abdomen or pelvis. Reproductive: Prostatomegaly with focal area of hyperenhancement of the  left prostate measuring 13 x 8 mm on series 4, image 27. Other: No abdominal wall hernia or abnormality. No abdominopelvic ascites. Musculoskeletal: No acute or significant osseous findings. VASCULAR MEASUREMENTS PERTINENT TO TAVR: AORTA: Minimal Aortic Diameter-12.3 mm Severity of Aortic Calcification-severe RIGHT PELVIS: Right Common Iliac Artery - Minimal Diameter-9.8 mm Tortuosity-mild Calcification-moderate Right External Iliac Artery - Minimal Diameter-9.0 mm Tortuosity-moderate Calcification-mild Right Common Femoral Artery - Minimal Diameter-7.2 mm Tortuosity-none Calcification-mild LEFT PELVIS: Left Common Iliac Artery - Minimal Diameter-7.3 mm Tortuosity-mild Calcification-moderate Left External Iliac Artery - Minimal Diameter-9.3 mm Tortuosity-moderate Calcification-mild Left Common Femoral Artery - Minimal Diameter-7.2 mm Tortuosity-none Calcification-moderate Review of the MIP images confirms the above findings. IMPRESSION: 1. Vascular findings and measurements pertinent to potential TAVR procedure, as detailed above. 2. Thickening and calcification of the aortic valve, compatible with reported clinical history of aortic stenosis. 3. 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 4. Bilateral ground-glass opacities which are most pronounced in the upper lobes, likely due to pulmonary edema. 5. Small bilateral pleural effusions and bibasilar atelectasis. 6. Prostatomegaly with focal area of hyperenhancement of the left prostate, recommend correlation with PSA. Electronically Signed   By: Yetta Glassman M.D.   On: 08/12/2022 14:44   CT  ANGIO CHEST AORTA W/CM & OR WO/CM  Result Date: 08/12/2022 CLINICAL DATA:  Aortic valve replacement preop evaluation EXAM: CT ANGIOGRAPHY CHEST, ABDOMEN AND PELVIS TECHNIQUE: Non-contrast CT of the chest was initially obtained. Multidetector CT imaging through the chest, abdomen and pelvis was performed using the standard protocol  during bolus administration of intravenous contrast. Multiplanar reconstructed images and MIPs were obtained and reviewed to evaluate the vascular anatomy. RADIATION DOSE REDUCTION: This exam was performed according to the departmental dose-optimization program which includes automated exposure control, adjustment of the mA and/or kV according to patient size and/or use of iterative reconstruction technique. CONTRAST:  92mL OMNIPAQUE IOHEXOL 350 MG/ML SOLN COMPARISON:  Chest CT dated August 11, 2022 FINDINGS: CTA CHEST FINDINGS Cardiovascular: Normal heart size. Aortic valve thickening and calcifications. No pericardial effusion. Normal caliber thoracic aorta with severe atherosclerotic disease. Severe three-vessel coronary artery calcifications Mediastinum/Nodes: Esophagus and thyroid are unremarkable. No pathologically enlarged lymph nodes seen in the chest. Calcified mediastinal and right hilar lymph nodes, likely sequela of prior granulomatous infection. Lungs/Pleura: Central airways are patent. Bilateral ground-glass opacities which are most pronounced in the upper lobes. Mild septal thickening. Small bilateral pleural effusions and bibasilar atelectasis. Musculoskeletal: No chest wall abnormality. No acute or significant osseous findings. CTA ABDOMEN AND PELVIS FINDINGS Hepatobiliary: No focal liver abnormality is seen. No gallstones, gallbladder wall thickening, or biliary dilatation. Pancreas: Unremarkable. No pancreatic ductal dilatation or surrounding inflammatory changes. Spleen: Normal in size without focal abnormality. Adrenals/Urinary Tract: Bilateral adrenal glands are unremarkable. No hydronephrosis or nephrolithiasis. Excreted contrast material is seen in the bladder. Stomach/Bowel: Stomach is within normal limits. Appendix appears normal. No evidence of bowel wall thickening, distention, or inflammatory changes. Vascular/lymphatic: Normal caliber abdominal aorta with severe calcified and  noncalcified plaque. No pathologically enlarged lymph nodes seen in the abdomen or pelvis. Reproductive: Prostatomegaly with focal area of hyperenhancement of the left prostate measuring 13 x 8 mm on series 4, image 27. Other: No abdominal wall hernia or abnormality. No abdominopelvic ascites. Musculoskeletal: No acute or significant osseous findings. VASCULAR MEASUREMENTS PERTINENT TO TAVR: AORTA: Minimal Aortic Diameter-12.3 mm Severity of Aortic Calcification-severe RIGHT PELVIS: Right Common Iliac Artery - Minimal Diameter-9.8 mm Tortuosity-mild Calcification-moderate Right External Iliac Artery - Minimal Diameter-9.0 mm Tortuosity-moderate Calcification-mild Right Common Femoral Artery - Minimal Diameter-7.2 mm Tortuosity-none Calcification-mild LEFT PELVIS: Left Common Iliac Artery - Minimal Diameter-7.3 mm Tortuosity-mild Calcification-moderate Left External Iliac Artery - Minimal Diameter-9.3 mm Tortuosity-moderate Calcification-mild Left Common Femoral Artery - Minimal Diameter-7.2 mm Tortuosity-none Calcification-moderate Review of the MIP images confirms the above findings. IMPRESSION: 1. Vascular findings and measurements pertinent to potential TAVR procedure, as detailed above. 2. Thickening and calcification of the aortic valve, compatible with reported clinical history of aortic stenosis. 3. 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 4. Bilateral ground-glass opacities which are most pronounced in the upper lobes, likely due to pulmonary edema. 5. Small bilateral pleural effusions and bibasilar atelectasis. 6. Prostatomegaly with focal area of hyperenhancement of the left prostate, recommend correlation with PSA. Electronically Signed   By: Yetta Glassman M.D.   On: 08/12/2022 14:44   CT CHEST WO CONTRAST  Result Date: 08/11/2022 CLINICAL DATA:  Aortic atherosclerosis, aortic stenosis, pre preop aortic assessment. EXAM: CT CHEST WITHOUT CONTRAST  TECHNIQUE: Multidetector CT imaging of the chest was performed following the standard protocol without IV contrast. RADIATION DOSE REDUCTION: This exam was performed according to the departmental dose-optimization program which includes automated exposure control, adjustment of the mA and/or  kV according to patient size and/or use of iterative reconstruction technique. COMPARISON:  None Available. FINDINGS: Cardiovascular: The heart is normal in size and there is no significant pericardial effusion. Three-vessel coronary artery calcifications are noted. There is atherosclerotic calcification of the aorta and aortic valve annulus. The ascending aorta measures 3.6 cm in diameter, the aortic arch measures 2.9 cm in the descending aorta measures 2.5 cm. The pulmonary trunk is normal in caliber. Mediastinum/Nodes: Calcified lymph nodes are noted in the mediastinum and right hilum. Few prominent lymph nodes are noted in the mediastinum measuring up to 1 cm in the subcarinal space. No axillary lymphadenopathy. Evaluation of the hilar regions is limited due to lack of IV contrast. The thyroid gland, trachea, and esophagus are within normal limits. Lungs/Pleura: There are small bilateral pleural effusions. Interstitial prominence and patchy airspace disease is noted bilaterally. Subpleural reticulations are present bilaterally. No pneumothorax. Upper Abdomen: Renal calculi are noted bilaterally. No acute abnormality. Musculoskeletal: Degenerative changes are present in the thoracic spine. No acute osseous abnormality. IMPRESSION: 1. Aortic atherosclerosis without evidence of aneurysm. 2. Coronary artery calcifications and atherosclerotic calcification of the aortic annulus. 3. Interstitial and airspace opacities bilaterally, possible edema versus pneumonitis. 4. Small bilateral pleural effusions. Electronically Signed   By: Brett Fairy M.D.   On: 08/11/2022 21:11   VAS US DOPPLER PRE CABG  Result Date:  08/11/2022 PREOPERATIVE VASCULAR EVALUATION Patient Name:  JVONTE BERRIAN  Date of Exam:   08/11/2022 Medical Rec #: [id number]       Accession #:    [id number] Date of Birth: October 29, 1945       Patient Gender: M Patient Age:   58 years Exam Location:  [facility] Procedure:      VAS US DOPPLER PRE CABG Referring Phys: Coralie Common --------------------------------------------------------------------------------  Indications:      Pre-CABG. Risk Factors:     Hyperlipidemia, coronary artery disease. Other Factors:    Severe aortic stenosis. Limitations:      Status post radial cath today. Bandages, IV Comparison Study: No prior study Performing Technologist: Sharion Dove RVS  Examination Guidelines: A complete evaluation includes B-mode imaging, spectral Doppler, color Doppler, and power Doppler as needed of all accessible portions of each vessel. Bilateral testing is considered an integral part of a complete examination. Limited examinations for reoccurring indications may be performed as noted.  Right Carotid Findings: +----------+--------+--------+--------+------------+------------------+           PSV cm/sEDV cm/sStenosisDescribe    Comments           +----------+--------+--------+--------+------------+------------------+ CCA Prox  57      14                          intimal thickening +----------+--------+--------+--------+------------+------------------+ CCA Distal65      12                          intimal thickening +----------+--------+--------+--------+------------+------------------+ ICA Prox  72      11              heterogenous                   +----------+--------+--------+--------+------------+------------------+ ICA Mid   53      18                                             +----------+--------+--------+--------+------------+------------------+  ICA Distal78      24                                              +----------+--------+--------+--------+------------+------------------+ ECA       81      11                                             +----------+--------+--------+--------+------------+------------------+ +----------+--------+-------+--------+------------+           PSV cm/sEDV cmsDescribeArm Pressure +----------+--------+-------+--------+------------+ Subclavian98                                  +----------+--------+-------+--------+------------+ +---------+--------+--+--------+--+ VertebralPSV cm/s52EDV cm/s14 +---------+--------+--+--------+--+ Left Carotid Findings: +----------+--------+--------+--------+------------+------------------+           PSV cm/sEDV cm/sStenosisDescribe    Comments           +----------+--------+--------+--------+------------+------------------+ CCA Prox  74      12                          intimal thickening +----------+--------+--------+--------+------------+------------------+ CCA Distal80      16                          intimal thickening +----------+--------+--------+--------+------------+------------------+ ICA Prox  97      25              heterogenous                   +----------+--------+--------+--------+------------+------------------+ ICA Mid   87      26                                             +----------+--------+--------+--------+------------+------------------+ ICA Distal78      21                                             +----------+--------+--------+--------+------------+------------------+ ECA       106     10                                             +----------+--------+--------+--------+------------+------------------+  +----------+--------+--------+---------+------------+ SubclavianPSV cm/sEDV cm/sDescribe Arm Pressure +----------+--------+--------+---------+------------+           233             Turbulent              +----------+--------+--------+---------+------------+ +---------+--------+--+--------+--+ VertebralPSV cm/s55EDV cm/s15 +---------+--------+--+--------+--+  Right Doppler Findings: +------+--------+-----+---------+--------+ Site  PressureIndexDoppler  Comments +------+--------+-----+---------+--------+ Radial             triphasic         +------+--------+-----+---------+--------+ Ulnar              triphasic         +------+--------+-----+---------+--------+  Left Doppler Findings: +------+--------+-----+---------+--------+ Site  PressureIndexDoppler  Comments +------+--------+-----+---------+--------+ Radial             triphasic         +------+--------+-----+---------+--------+ Ulnar              triphasic         +------+--------+-----+---------+--------+  Technologist Notes: Unable to perform Allen's test secondary to radial cath done today.  Summary: Right Carotid: The extracranial vessels were near-normal with only minimal wall                thickening or plaque. Left Carotid: The extracranial vessels were near-normal with only minimal wall               thickening or plaque. Vertebrals:  Bilateral vertebral arteries demonstrate antegrade flow. Subclavians: Left subclavian artery flow was disturbed. Normal flow hemodynamics              were seen in the right subclavian artery. Right Upper Extremity: Doppler waveforms decrease <50% with right radial compression. Doppler waveforms decrease >50% with right ulnar compression.  Electronically signed by Lemar Livings MD on 08/11/2022 at 5:32:04 PM.    Final    CARDIAC CATHETERIZATION  Addendum Date: 08/11/2022   CONCLUSIONS: Severe calcific aortic stenosis with calculated aortic valve area less than 0.75 cm. 95% proximal to mid LAD followed by 70% further down the vessel.  The proximal lesion is within a calcified segment. 70 to 80% mid nondominant RCA which supplies faint collaterals to the LAD. Mild pulmonary  hypertension.  Capillary wedge pressure 15 mmHg. RECOMMENDATIONS: Surgical aortic valve replacement and LIMA to LAD and possibly the acute marginal branch of the RCA.  Result Date: 08/11/2022   Mid LAD lesion is 70% stenosed.   Prox LAD to Mid LAD lesion is 95% stenosed.   1st Sept lesion is 70% stenosed.   Prox RCA lesion is 75% stenosed.   There is mild left ventricular systolic dysfunction.   LV end diastolic pressure is normal.   LV end diastolic pressure is normal.   The left ventricular ejection fraction is 45-50% by visual estimate.   Hemodynamic findings consistent with mild pulmonary hypertension and aortic valve stenosis.   There is severe aortic valve stenosis.   ECHOCARDIOGRAM COMPLETE  Result Date: 08/10/2022    ECHOCARDIOGRAM REPORT   Patient Name:   MICKIE KOZIKOWSKI Date of Exam: 08/10/2022 Medical Rec #:  [id number]      Height:       70.0 in Accession #:    [id number]     Weight:       155.4 lb Date of Birth:  09/04/1946      BSA:          1.875 m Patient Age:    76 years       BP:           155/87 mmHg Patient Gender: M              HR:           84 bpm. Exam Location:  Inpatient Procedure: 2D Echo, Color Doppler and Cardiac Doppler Indications:    NSTEMI i21.4  History:        Patient has prior history of Echocardiogram examinations, most                 recent 11/25/2018. Risk Factors:Dyslipidemia.  Sonographer:    Irving Burton Senior RDCS Referring Phys: [id number] MATTHEW A CARLISLE IMPRESSIONS  1. Left ventricular ejection fraction, by estimation,  is 40 to 45%. The left ventricle has mildly decreased function. The left ventricle demonstrates global hypokinesis. Left ventricular diastolic parameters are consistent with Grade III diastolic dysfunction (restrictive). Elevated left ventricular end-diastolic pressure.  2. Right ventricular systolic function is normal. The right ventricular size is normal. There is mildly elevated pulmonary artery systolic pressure. The estimated right ventricular systolic  pressure is 123XX123 mmHg.  3. Left atrial size was mildly dilated.  4. The mitral valve is degenerative. Mild mitral valve regurgitation. No evidence of mitral stenosis. The mean mitral valve gradient is 3.0 mmHg.  5. The aortic valve has an indeterminant number of cusps. There is severe calcifcation of the aortic valve. There is severe thickening of the aortic valve. Aortic valve regurgitation is not visualized. Severe aortic valve stenosis. Aortic valve area, by  VTI measures 0.53 cm. Aortic valve mean gradient measures 45.0 mmHg. Aortic valve Vmax measures 4.10 m/s.  6. The inferior vena cava is dilated in size with <50% respiratory variability, suggesting right atrial pressure of 15 mmHg. FINDINGS  Left Ventricle: Left ventricular ejection fraction, by estimation, is 40 to 45%. The left ventricle has mildly decreased function. The left ventricle demonstrates global hypokinesis. The left ventricular internal cavity size was normal in size. There is  no left ventricular hypertrophy. Left ventricular diastolic parameters are consistent with Grade III diastolic dysfunction (restrictive). Elevated left ventricular end-diastolic pressure. Right Ventricle: The right ventricular size is normal. No increase in right ventricular wall thickness. Right ventricular systolic function is normal. There is mildly elevated pulmonary artery systolic pressure. The tricuspid regurgitant velocity is 2.36  m/s, and with an assumed right atrial pressure of 15 mmHg, the estimated right ventricular systolic pressure is 123XX123 mmHg. Left Atrium: Left atrial size was mildly dilated. Right Atrium: Right atrial size was normal in size. Pericardium: There is no evidence of pericardial effusion. Mitral Valve: The mitral valve is degenerative in appearance. There is moderate thickening of the mitral valve leaflet(s). There is mild calcification of the mitral valve leaflet(s). Mild to moderate mitral annular calcification. Mild mitral valve  regurgitation. No evidence of mitral valve stenosis. MV peak gradient, 9.5 mmHg. The mean mitral valve gradient is 3.0 mmHg. Tricuspid Valve: The tricuspid valve is normal in structure. Tricuspid valve regurgitation is not demonstrated. No evidence of tricuspid stenosis. Aortic Valve: The aortic valve has an indeterminant number of cusps. There is severe calcifcation of the aortic valve. There is severe thickening of the aortic valve. Aortic valve regurgitation is not visualized. Severe aortic stenosis is present. Aortic  valve mean gradient measures 45.0 mmHg. Aortic valve peak gradient measures 67.2 mmHg. Aortic valve area, by VTI measures 0.53 cm. Pulmonic Valve: The pulmonic valve was normal in structure. Pulmonic valve regurgitation is not visualized. No evidence of pulmonic stenosis. Aorta: The aortic root is normal in size and structure. Venous: The inferior vena cava is dilated in size with less than 50% respiratory variability, suggesting right atrial pressure of 15 mmHg. IAS/Shunts: No atrial level shunt detected by color flow Doppler.  LEFT VENTRICLE PLAX 2D LVIDd:         4.75 cm      Diastology LVIDs:         3.60 cm      LV e' medial:    4.57 cm/s LV PW:         0.95 cm      LV E/e' medial:  30.6 LV IVS:        0.90 cm  LV e' lateral:   5.22 cm/s LVOT diam:     1.95 cm      LV E/e' lateral: 26.8 LV SV:         48 LV SV Index:   26 LVOT Area:     2.99 cm  LV Volumes (MOD) LV vol d, MOD A2C: 140.0 ml LV vol d, MOD A4C: 102.0 ml LV vol s, MOD A2C: 73.1 ml LV vol s, MOD A4C: 60.1 ml LV SV MOD A2C:     66.9 ml LV SV MOD A4C:     102.0 ml LV SV MOD BP:      53.5 ml RIGHT VENTRICLE RV S prime:     10.80 cm/s TAPSE (M-mode): 1.8 cm LEFT ATRIUM             Index        RIGHT ATRIUM           Index LA diam:        4.05 cm 2.16 cm/m   RA Area:     17.20 cm LA Vol (A2C):   56.0 ml 29.87 ml/m  RA Volume:   48.70 ml  25.97 ml/m LA Vol (A4C):   65.9 ml 35.15 ml/m LA Biplane Vol: 64.9 ml 34.61 ml/m  AORTIC  VALVE AV Area (Vmax):    0.53 cm AV Area (Vmean):   0.51 cm AV Area (VTI):     0.53 cm AV Vmax:           410.00 cm/s AV Vmean:          322.000 cm/s AV VTI:            0.911 m AV Peak Grad:      67.2 mmHg AV Mean Grad:      45.0 mmHg LVOT Vmax:         72.30 cm/s LVOT Vmean:        55.000 cm/s LVOT VTI:          0.162 m LVOT/AV VTI ratio: 0.18  AORTA Ao Root diam: 3.05 cm MITRAL VALVE                TRICUSPID VALVE MV Area (PHT): 4.68 cm     TR Peak grad:   22.3 mmHg MV Area VTI:   2.14 cm     TR Vmax:        236.00 cm/s MV Peak grad:  9.5 mmHg MV Mean grad:  3.0 mmHg     SHUNTS MV Vmax:       1.54 m/s     Systemic VTI:  0.16 m MV Vmean:      81.8 cm/s    Systemic Diam: 1.95 cm MV Decel Time: 162 msec MV E velocity: 140.00 cm/s MV A velocity: 53.40 cm/s MV E/A ratio:  2.62 Fransico Him MD Electronically signed by Fransico Him MD Signature Date/Time: 08/10/2022/6:05:45 PM    Final    DG Chest Portable 1 View  Result Date: 08/09/2022 CLINICAL DATA:  Chest pain and syncope EXAM: PORTABLE CHEST 1 VIEW COMPARISON:  Chest x-ray 11/25/2018 FINDINGS: The heart size and mediastinal contours are within normal limits. Both lungs are clear. The visualized skeletal structures are unremarkable. IMPRESSION: No active disease. Electronically Signed   By: Ronney Asters M.D.   On: 08/09/2022 20:04     STS Risk Calculator:   [facility] Cardiomyopathy Questionnaire     08/13/2022    1:15 PM  KCCQ-12  1 a. Ability  to shower/bathe Not at all limited  1 b. Ability to walk 1 block Not at all limited  1 c. Ability to hurry/jog Other, Did not do  2. Edema feet/ankles/legs Never over the past 2 weeks  3. Limited by fatigue 3+ times per week, not every day  4. Limited by dyspnea Never over the past 2 weeks  5. Sitting up / on 3+ pillows Never over the past 2 weeks  6. Limited enjoyment of life Slightly limited  7. Rest of life w/ symptoms Mostly dissatisfied  8 a. Participation in hobbies Moderately limited  8  b. Participation in chores Moderately limited  8 c. Visiting family/friends Slightly limited       Assessment and Plan:   PANKAJ HOUCHEN is a 76 y.o. male with symptoms of severe aortic stenosis with NYHA Class II symptoms. I have reviewed the patient's recent echocardiogram which is notable for [id number] LV systolic function and severe aortic stenosis with peak gradient of 67.14mmHg and mean transvalvular gradient of 45.0 mmHg. The patient's dimensionless index is 0.18 and calculated aortic valve area is 0.53 cm.    I have reviewed the natural history of aortic stenosis with the patient. We have discussed the limitations of medical therapy and the poor prognosis associated with symptomatic aortic stenosis. We have reviewed potential treatment options, including palliative medical therapy, conventional surgical aortic valve replacement, and transcatheter aortic valve replacement. We discussed treatment options in the context of this patient's specific comorbid medical conditions.    The patient's predicted risk of mortality with conventional aortic valve replacement is 3.48% primarily based on age, proximal LAD lesion, NSTEMI, and concurrent CABG. Other significant comorbid conditions include syncope, new SVT, severe aortic calcification . TAVR seems like a reasonable treatment option for this patient. He already underwent R/LHC, CT imaging, and surgical evaluation. He is scheduled for PCI today 08/13/22. He is scheduled for TAVR next Tuesday and will plan for him to remain inpatient until then.    For questions or updates, please contact [place] Please consult [email or web address] for contact info under    Signed, Hessie Dibble, RN, Student Nurse Practitioner   08/13/2022 9:53 AM   Patient seen, examined. Available data reviewed. Agree with findings, assessment, and plan as outlined by Ellsworth Lennox, RN, NP Student.  The patient is independently interviewed and examined.  He is alert, oriented,  elderly male in no distress.  HEENT is normal except for poor dentition, JVP is normal, carotid upstrokes are 2+ with bilateral bruits, lungs are clear bilaterally, heart is regular rate and rhythm with a 3/6 harsh crescendo decrescendo murmur at the right upper sternal border, no diastolic murmur.  Abdomen is soft and nontender with no organomegaly, extremities have no edema, skin is warm and dry with no rash, neurologic is grossly intact with 5/5 strength in the arms and legs bilaterally.  The patient's echo, cardiac catheterization, and CT angiography studies are all personally reviewed.  He has severe, symptomatic, stage D1 aortic stenosis with reduced LVEF of 45%.  He has severely elevated transaortic gradients confirmed by cardiac catheterization and echo assessment.  The patient also has severe proximal to mid LAD stenosis with heavy calcification.  He was initially evaluated by cardiac surgery and recommended to have AVR/CABG.  However, his CT scan demonstrated severe calcification of the ascending aorta and he is felt to be at high risk of crossclamping the aorta.  It is recommended that he proceed with PCI and TAVR.  I  agree and have reviewed his cardiac catheterization films.  I think his severe proximal to mid LAD stenosis is amenable to orbital atherectomy, PTCA, and stenting.  I have reviewed risks, indications, and alternatives to this procedure with the patient who understands and agrees to proceed.  He has been loaded with clopidogrel.  Once he recovers from PCI, we would anticipate moving forward with TAVR next week.  His CT angiography studies demonstrate suitable anatomy for transfemoral access with treatment using a 26 mm Edwards SAPIEN 3 ultra Resilia valve.  Serious comorbidities include presentation with non-STEMI, SVT, and frank syncope in the setting of very severe aortic stenosis and obstructive coronary artery disease.  Sherren Mocha, M.D. 08/13/2022 4:29 PM

## 2022-08-13 NOTE — Plan of Care (Signed)
  Problem: Cardiac: Goal: Ability to achieve and maintain adequate cardiovascular perfusion will improve Outcome: Progressing   Problem: Education: Goal: Understanding of CV disease, CV risk reduction, and recovery process will improve Outcome: Progressing   Problem: Cardiovascular: Goal: Vascular access site(s) Level 0-1 will be maintained Outcome: Progressing   Problem: Health Behavior/Discharge Planning: Goal: Ability to safely manage health-related needs after discharge will improve Outcome: Progressing

## 2022-08-13 NOTE — Progress Notes (Signed)
ANTICOAGULATION CONSULT NOTE  Pharmacy Consult for Heparin Indication: chest pain/ACS   Allergies  Allergen Reactions   Statins     Patient Measurements: Height: 5\' 10"  (177.8 cm) Weight: 71.3 kg (157 lb 3 oz) IBW/kg (Calculated) : 73 Heparin Dosing Weight: 70.5 kg  Vital Signs: Temp: 98.7 F (37.1 C) (11/22 0816) Temp Source: Oral (11/22 0816) BP: 143/73 (11/22 0921) Pulse Rate: 78 (11/22 0921)  Labs: Recent Labs    08/11/22 0047 08/11/22 0813 08/11/22 0824 08/12/22 0043 08/12/22 1039 08/12/22 2148 08/13/22 0129  HGB 12.8*   < > 11.9* 11.6*  --   --  12.2*  HCT 38.6*   < > 35.0* 33.9*  --   --  36.3*  PLT 147*  --   --  127*  --   --  138*  HEPARINUNFRC 0.27*  --   --  <0.10* 0.13* 0.26* 0.33  CREATININE 1.11  --   --  0.90  --   --   --    < > = values in this interval not displayed.     Estimated Creatinine Clearance: 68.2 mL/min (by C-G formula based on SCr of 0.9 mg/dL).  Assessment: 76 year old male admitted for nstemi workup. 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 Patient being evaluated for surgical aortic valve replacement and bypass grafts. Orders to resume heparin post cath  Staged PCI planned today, heparin therapeutic at 0.33, CBC stable.  Goal of Therapy:  Heparin level 0.3-0.7 units/ml Monitor platelets by anticoagulation protocol: Yes   Plan:  Continue heparin 1550 units/h F/U post/cath  68, PharmD, BCPS, [facility] Clinical Pharmacist [phone] Please check AMION for all [facility] Pharmacy numbers 08/13/2022

## 2022-08-13 NOTE — Consult Note (Addendum)
   [facility] Inpatient Consult   08/13/2022  BAYLEE MCCORKEL 06/26/1946 [id number]  [place] Organization [ACO] Patient: Medicare ACO REACH  Primary Care Provider:  Sharilyn Sites, MD, [place] was confirmed by son at bedside  Patient has been followed and screened for hospitalization with noted and discussed in unit progression meeting and to assess for potential [place] Management service needs for post hospital transition for care coordination. Patient has had ongoing procedures. Admitted with NSTEMI. Review of patient's electronic medical record MD progress notes, Inpatient TOC  reveals patient is for ongoing procedures.  SDOH reviewed and intact with no needs assessed. Met briefly at the bedside, son helping patient with personal care needs and monitor.   Plan:  Continue to follow progress and disposition to assess for post hospital community care coordination/management needs.  Referral request for community care coordination: will be further assessed pending outcomes of procedures and needs changes.  Of note, [facility] Care Management/Population Health does not replace or interfere with any arrangements made by the Inpatient Transition of Care team.  For questions contact:   Natividad Brood, RN BSN [place]  [phone] business mobile phone Toll free office [phone]  *[place]  [phone] Fax number: [phone] [email or web address]

## 2022-08-13 NOTE — Progress Notes (Addendum)
Rounding Note    Patient Name: Jonathan Romero Date of Encounter: 08/13/2022  [place] Cardiologist: None   Subjective   Feeling well this morning. No chest pain. Family at the bedside.   Inpatient Medications    Scheduled Meds:  aspirin EC  81 mg Oral Daily   clopidogrel  75 mg Oral Daily   metoprolol succinate  25 mg Oral Daily   sodium chloride flush  3 mL Intravenous Q12H   sodium chloride flush  3 mL Intravenous Q12H   Continuous Infusions:  sodium chloride Stopped (08/11/22 1319)   sodium chloride     sodium chloride 1 mL/kg/hr (08/13/22 0723)   heparin 1,650 Units/hr (08/12/22 2357)   PRN Meds: sodium chloride, sodium chloride, acetaminophen, nitroGLYCERIN, ondansetron (ZOFRAN) IV, oxyCODONE, sodium chloride flush, sodium chloride flush   Vital Signs    Vitals:   08/12/22 1918 08/13/22 0045 08/13/22 0419 08/13/22 0553  BP: 109/79 123/73 121/70   Pulse: 75 94 84   Resp: 20     Temp: 98.3 F (36.8 C) 98.3 F (36.8 C) 98.7 F (37.1 C)   TempSrc: Oral Oral Oral   SpO2: 97% 96% 95%   Weight:  71.3 kg  71.3 kg  Height:        Intake/Output Summary (Last 24 hours) at 08/13/2022 0748 Last data filed at 08/12/2022 2300 Gross per 24 hour  Intake 840 ml  Output --  Net 840 ml      08/13/2022    5:53 AM 08/13/2022   12:45 AM 08/12/2022    5:35 AM  Last 3 Weights  Weight (lbs) 157 lb 3 oz 157 lb 3.2 oz 156 lb 6.4 oz  Weight (kg) 71.3 kg 71.305 kg 70.943 kg      Telemetry    Sinus Rhythm, brief episodes of NSVT - Personally Reviewed  ECG    No new tracing  Physical Exam   GEN: No acute distress.   Neck: No JVD Cardiac: RRR, 4/6 systolic murmur RUSB, no rubs, or gallops.  Respiratory: Clear to auscultation bilaterally. GI: Soft, nontender, non-distended  MS: No edema; No deformity. Neuro:  Nonfocal  Psych: Normal affect   Labs    High Sensitivity Troponin:   Recent Labs  Lab 08/09/22 1841 08/09/22 2047  TROPONINIHS  2,108* 2,044*     Chemistry Recent Labs  Lab 08/09/22 1841 08/11/22 0047 08/11/22 0813 08/11/22 0814 08/11/22 0824 08/12/22 0043  NA 139 141   < > 142 140 138  K 4.6 3.8   < > 3.7 3.8 4.0  CL 106 109  --   --   --  108  CO2 22 23  --   --   --  21*  GLUCOSE 148* 147*  --   --   --  106*  BUN 25* 24*  --   --   --  18  CREATININE 1.12 1.11  --   --   --  0.90  CALCIUM 8.5* 8.5*  --   --   --  8.1*  MG 2.0  --   --   --   --   --   PROT 5.7*  --   --   --   --   --   ALBUMIN 3.0*  --   --   --   --   --   AST 40  --   --   --   --   --   ALT 19  --   --   --   --   --  ALKPHOS 75  --   --   --   --   --   BILITOT 1.6*  --   --   --   --   --   GFRNONAA >60 >60  --   --   --  >60  ANIONGAP 11 9  --   --   --  9   < > = values in this interval not displayed.    Lipids  Recent Labs  Lab 08/10/22 2136  CHOL 215*  TRIG 49  HDL 45  LDLCALC 160*  CHOLHDL 4.8    Hematology Recent Labs  Lab 08/11/22 0047 08/11/22 0813 08/11/22 0824 08/12/22 0043 08/13/22 0129  WBC 9.2  --   --  10.3 8.6  RBC 3.81*  --   --  3.36* 3.67*  HGB 12.8*   < > 11.9* 11.6* 12.2*  HCT 38.6*   < > 35.0* 33.9* 36.3*  MCV 101.3*  --   --  100.9* 98.9  MCH 33.6  --   --  34.5* 33.2  MCHC 33.2  --   --  34.2 33.6  RDW 13.4  --   --  13.6 13.2  PLT 147*  --   --  127* 138*   < > = values in this interval not displayed.   Thyroid  Recent Labs  Lab 08/09/22 1841 08/09/22 1843  TSH  --  2.157  FREET4 0.95  --     BNP Recent Labs  Lab 08/09/22 1843  BNP 486.3*    DDimer No results for input(s): "DDIMER" in the last 168 hours.   Radiology    CT CORONARY MORPH W/CTA COR W/SCORE W/CA W/CM &/OR WO/CM  Addendum Date: 08/12/2022   ADDENDUM REPORT: 08/12/2022 15:24 CLINICAL DATA:  Aortic Valve pathology with assessment for TAVR EXAM: Cardiac TAVR CT TECHNIQUE: The patient was scanned on a Siemens Force 192 slice scanner. A 120 kV retrospective scan was triggered in the descending thoracic  aorta at 111 HU's. Gantry rotation speed was 270 msecs and collimation was .9 mm. No beta blockade or nitro were given. The 3D data set was reconstructed in 5% intervals of the R-R cycle. Systolic and diastolic phases were analyzed on a dedicated work station using MPR, MIP and VRT modes. The patient received 95 cc of contrast. FINDINGS: Aortic Valve: Severely thickened tri-leaflet aortic valve with heavy calcification and reduced excursion the planimeter valve area is 0.924 Sq cm consistent with severe aortic stenosis Number of leaflets: 3 LVOT calcification: Presence of LVOT calcification into the intervalvular fibrosa Annular calcification: Severe due to presence of LVOT calcification, moderate otherwise Aortic Valve Calcium Score: 2749 Presence of basal septal hypertrophy: Non severe Perimembranous septal diameter: 7 mm Mitral Valve: No significant mitral annular calcification Aortic Annulus Measurements- 30% phase Major annulus diameter: 29 mm Minor annulus diameter: 21 mm Annular perimeter: 79 mm Annular area: 4.67 cm2 Aortic Root Measurements- 70% phase Sinotubular Junction: 30 mm Ascending Thoracic Aorta: 36 mm Aortic Arch: 28 mm Descending Thoracic Aorta: 25 mm Aortic atherosclerosis. Sinus of Valsalva Measurements: Right coronary cusp width: 29 mm Left coronary cusp width: 29 mm Non coronary cusp width: 28 mm Coronary Artery Height above Annulus: Left Main: 13 mm Left SoV height: 20 mm Right Coronary: 14 mm Right SoV height: 20 mm Optimum Fluoroscopic Angle for Delivery: LAO 5, CAU 8 Cusp overlay view angle: RAO 0 CAU 11 Valves for structural team consideration: 26 mm Sapien Valve 29 mm Evolut Non TAVR  Valve Findings: Coronary Arteries: Normal coronary origin. Study not completed with nitroglycerin. Coronary Calcium Score: Left main: 504 Left anterior descending artery: 951 Left circumflex artery: 237 Right coronary artery: 34 Total: 1726 Percentile: 83rd for age, sex, and race matched control. Systemic  veins: Normal anatomy Main Pulmonary artery: Normal caliber Pulmonary veins: Normal anatomy Left atrial appendage: Patent Interatrial septum: No communications Left ventricle: Normal size Left atrium: Mild dilation Right ventricle: Normal size Right atrium: Mild dilation Pericardium: Trivial anterior pericardial effusion Extra Cardiac Findings as per separate reporting. IMPRESSION: 1. Severe Aortic stenosis. Findings pertinent to TAVR procedure are detailed above. RECOMMENDATIONS: The proposed cut-off value of 1,651 AU yielded a 93 % sensitivity and 75 % specificity in grading AS severity in patients with classical low-flow, low-gradient AS. Proposed different cut-off values to define severe AS for men and women as 2,065 AU and 1,274 AU, respectively. The joint European and American recommendations for the assessment of AS consider the aortic valve calcium score as a continuum - a very high calcium score suggests severe AS and a low calcium score suggests severe AS is unlikely. Kerman Passey, et al. 2017 ESC/EACTS Guidelines for the management of valvular heart disease. Eur Heart J [phone] Coronary artery calcium (CAC) score is a strong predictor of incident coronary heart disease (CHD) and provides predictive information beyond traditional risk factors. CAC scoring is reasonable to use in the decision to withhold, postpone, or initiate statin therapy in intermediate-risk or selected borderline-risk asymptomatic adults (age 25-75 years and LDL-C >=70 to <190 mg/dL) who do not have diabetes or established atherosclerotic cardiovascular disease (ASCVD).* In intermediate-risk (10-year ASCVD risk >=7.5% to <20%) adults or selected borderline-risk (10-year ASCVD risk >=5% to <7.5%) adults in whom a CAC score is measured for the purpose of making a treatment decision the following recommendations have been made: If CAC = 0, it is reasonable to withhold statin therapy and reassess in 5 to 10 years,  as long as higher risk conditions are absent (diabetes mellitus, family history of premature CHD in first degree relatives (males <55 years; females <65 years), cigarette smoking, LDL >=190 mg/dL or other independent risk factors). If CAC is 1 to 99, it is reasonable to initiate statin therapy for patients >=52 years of age. If CAC is >=100 or >=75th percentile, it is reasonable to initiate statin therapy at any age. Cardiology referral should be considered for patients with CAC scores >=400 or >=75th percentile. *2018 AHA/ACC/AACVPR/AAPA/ABC/ACPM/ADA/AGS/APhA/ASPC/NLA/PCNA Guideline on the Management of Blood Cholesterol: A Report of the American College of Cardiology/American Heart Association Task Force on Clinical Practice Guidelines. J Am Coll Cardiol. 2019;73(24):3168-3209. Mahesh  Chandrasekhar Electronically Signed   By: Rudean Haskell M.D.   On: 08/12/2022 15:24   Result Date: 08/12/2022 EXAM: OVER-READ INTERPRETATION  CT CHEST The following report is a limited chest CT over-read performed by radiologist Dr. Yetta Glassman of [facility], [place] on 08/12/2022. This over-read does not include interpretation of cardiac or coronary anatomy or pathology. The cardiac TAVR interpretation by the cardiologist is attached. COMPARISON:  None Available. FINDINGS: Extracardiac findings will be described separately under dictation for contemporaneously obtained CTA chest, abdomen and pelvis. IMPRESSION: Please see separate dictation for contemporaneously obtained CTA chest, abdomen and pelvis dated 08/12/2022 for full description of relevant extracardiac findings. Electronically Signed: By: Yetta Glassman M.D. On: 08/12/2022 14:00   CT Angio Abd/Pel w/ and/or w/o  Result Date: 08/12/2022 CLINICAL DATA:  Aortic valve replacement preop evaluation EXAM: CT  ANGIOGRAPHY CHEST, ABDOMEN AND PELVIS TECHNIQUE: Non-contrast CT of the chest was initially obtained. Multidetector CT imaging through the chest,  abdomen and pelvis was performed using the standard protocol during bolus administration of intravenous contrast. Multiplanar reconstructed images and MIPs were obtained and reviewed to evaluate the vascular anatomy. RADIATION DOSE REDUCTION: This exam was performed according to the departmental dose-optimization program which includes automated exposure control, adjustment of the mA and/or kV according to patient size and/or use of iterative reconstruction technique. CONTRAST:  41mL OMNIPAQUE IOHEXOL 350 MG/ML SOLN COMPARISON:  Chest CT dated August 11, 2022 FINDINGS: CTA CHEST FINDINGS Cardiovascular: Normal heart size. Aortic valve thickening and calcifications. No pericardial effusion. Normal caliber thoracic aorta with severe atherosclerotic disease. Severe three-vessel coronary artery calcifications Mediastinum/Nodes: Esophagus and thyroid are unremarkable. No pathologically enlarged lymph nodes seen in the chest. Calcified mediastinal and right hilar lymph nodes, likely sequela of prior granulomatous infection. Lungs/Pleura: Central airways are patent. Bilateral ground-glass opacities which are most pronounced in the upper lobes. Mild septal thickening. Small bilateral pleural effusions and bibasilar atelectasis. Musculoskeletal: No chest wall abnormality. No acute or significant osseous findings. CTA ABDOMEN AND PELVIS FINDINGS Hepatobiliary: No focal liver abnormality is seen. No gallstones, gallbladder wall thickening, or biliary dilatation. Pancreas: Unremarkable. No pancreatic ductal dilatation or surrounding inflammatory changes. Spleen: Normal in size without focal abnormality. Adrenals/Urinary Tract: Bilateral adrenal glands are unremarkable. No hydronephrosis or nephrolithiasis. Excreted contrast material is seen in the bladder. Stomach/Bowel: Stomach is within normal limits. Appendix appears normal. No evidence of bowel wall thickening, distention, or inflammatory changes. Vascular/lymphatic:  Normal caliber abdominal aorta with severe calcified and noncalcified plaque. No pathologically enlarged lymph nodes seen in the abdomen or pelvis. Reproductive: Prostatomegaly with focal area of hyperenhancement of the left prostate measuring 13 x 8 mm on series 4, image 27. Other: No abdominal wall hernia or abnormality. No abdominopelvic ascites. Musculoskeletal: No acute or significant osseous findings. VASCULAR MEASUREMENTS PERTINENT TO TAVR: AORTA: Minimal Aortic Diameter-12.3 mm Severity of Aortic Calcification-severe RIGHT PELVIS: Right Common Iliac Artery - Minimal Diameter-9.8 mm Tortuosity-mild Calcification-moderate Right External Iliac Artery - Minimal Diameter-9.0 mm Tortuosity-moderate Calcification-mild Right Common Femoral Artery - Minimal Diameter-7.2 mm Tortuosity-none Calcification-mild LEFT PELVIS: Left Common Iliac Artery - Minimal Diameter-7.3 mm Tortuosity-mild Calcification-moderate Left External Iliac Artery - Minimal Diameter-9.3 mm Tortuosity-moderate Calcification-mild Left Common Femoral Artery - Minimal Diameter-7.2 mm Tortuosity-none Calcification-moderate Review of the MIP images confirms the above findings. IMPRESSION: 1. Vascular findings and measurements pertinent to potential TAVR procedure, as detailed above. 2. Thickening and calcification of the aortic valve, compatible with reported clinical history of aortic stenosis. 3. 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 4. Bilateral ground-glass opacities which are most pronounced in the upper lobes, likely due to pulmonary edema. 5. Small bilateral pleural effusions and bibasilar atelectasis. 6. Prostatomegaly with focal area of hyperenhancement of the left prostate, recommend correlation with PSA. Electronically Signed   By: Yetta Glassman M.D.   On: 08/12/2022 14:44   CT ANGIO CHEST AORTA W/CM & OR WO/CM  Result Date: 08/12/2022 CLINICAL DATA:  Aortic valve replacement preop  evaluation EXAM: CT ANGIOGRAPHY CHEST, ABDOMEN AND PELVIS TECHNIQUE: Non-contrast CT of the chest was initially obtained. Multidetector CT imaging through the chest, abdomen and pelvis was performed using the standard protocol during bolus administration of intravenous contrast. Multiplanar reconstructed images and MIPs were obtained and reviewed to evaluate the vascular anatomy. RADIATION DOSE REDUCTION: This exam was performed according to the departmental dose-optimization program  which includes automated exposure control, adjustment of the mA and/or kV according to patient size and/or use of iterative reconstruction technique. CONTRAST:  25mL OMNIPAQUE IOHEXOL 350 MG/ML SOLN COMPARISON:  Chest CT dated August 11, 2022 FINDINGS: CTA CHEST FINDINGS Cardiovascular: Normal heart size. Aortic valve thickening and calcifications. No pericardial effusion. Normal caliber thoracic aorta with severe atherosclerotic disease. Severe three-vessel coronary artery calcifications Mediastinum/Nodes: Esophagus and thyroid are unremarkable. No pathologically enlarged lymph nodes seen in the chest. Calcified mediastinal and right hilar lymph nodes, likely sequela of prior granulomatous infection. Lungs/Pleura: Central airways are patent. Bilateral ground-glass opacities which are most pronounced in the upper lobes. Mild septal thickening. Small bilateral pleural effusions and bibasilar atelectasis. Musculoskeletal: No chest wall abnormality. No acute or significant osseous findings. CTA ABDOMEN AND PELVIS FINDINGS Hepatobiliary: No focal liver abnormality is seen. No gallstones, gallbladder wall thickening, or biliary dilatation. Pancreas: Unremarkable. No pancreatic ductal dilatation or surrounding inflammatory changes. Spleen: Normal in size without focal abnormality. Adrenals/Urinary Tract: Bilateral adrenal glands are unremarkable. No hydronephrosis or nephrolithiasis. Excreted contrast material is seen in the bladder.  Stomach/Bowel: Stomach is within normal limits. Appendix appears normal. No evidence of bowel wall thickening, distention, or inflammatory changes. Vascular/lymphatic: Normal caliber abdominal aorta with severe calcified and noncalcified plaque. No pathologically enlarged lymph nodes seen in the abdomen or pelvis. Reproductive: Prostatomegaly with focal area of hyperenhancement of the left prostate measuring 13 x 8 mm on series 4, image 27. Other: No abdominal wall hernia or abnormality. No abdominopelvic ascites. Musculoskeletal: No acute or significant osseous findings. VASCULAR MEASUREMENTS PERTINENT TO TAVR: AORTA: Minimal Aortic Diameter-12.3 mm Severity of Aortic Calcification-severe RIGHT PELVIS: Right Common Iliac Artery - Minimal Diameter-9.8 mm Tortuosity-mild Calcification-moderate Right External Iliac Artery - Minimal Diameter-9.0 mm Tortuosity-moderate Calcification-mild Right Common Femoral Artery - Minimal Diameter-7.2 mm Tortuosity-none Calcification-mild LEFT PELVIS: Left Common Iliac Artery - Minimal Diameter-7.3 mm Tortuosity-mild Calcification-moderate Left External Iliac Artery - Minimal Diameter-9.3 mm Tortuosity-moderate Calcification-mild Left Common Femoral Artery - Minimal Diameter-7.2 mm Tortuosity-none Calcification-moderate Review of the MIP images confirms the above findings. IMPRESSION: 1. Vascular findings and measurements pertinent to potential TAVR procedure, as detailed above. 2. Thickening and calcification of the aortic valve, compatible with reported clinical history of aortic stenosis. 3. 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 4. Bilateral ground-glass opacities which are most pronounced in the upper lobes, likely due to pulmonary edema. 5. Small bilateral pleural effusions and bibasilar atelectasis. 6. Prostatomegaly with focal area of hyperenhancement of the left prostate, recommend correlation with PSA. Electronically Signed   By:  Yetta Glassman M.D.   On: 08/12/2022 14:44   CT CHEST WO CONTRAST  Result Date: 08/11/2022 CLINICAL DATA:  Aortic atherosclerosis, aortic stenosis, pre preop aortic assessment. EXAM: CT CHEST WITHOUT CONTRAST TECHNIQUE: Multidetector CT imaging of the chest was performed following the standard protocol without IV contrast. RADIATION DOSE REDUCTION: This exam was performed according to the departmental dose-optimization program which includes automated exposure control, adjustment of the mA and/or kV according to patient size and/or use of iterative reconstruction technique. COMPARISON:  None Available. FINDINGS: Cardiovascular: The heart is normal in size and there is no significant pericardial effusion. Three-vessel coronary artery calcifications are noted. There is atherosclerotic calcification of the aorta and aortic valve annulus. The ascending aorta measures 3.6 cm in diameter, the aortic arch measures 2.9 cm in the descending aorta measures 2.5 cm. The pulmonary trunk is normal in caliber. Mediastinum/Nodes: Calcified lymph nodes are noted in the mediastinum  and right hilum. Few prominent lymph nodes are noted in the mediastinum measuring up to 1 cm in the subcarinal space. No axillary lymphadenopathy. Evaluation of the hilar regions is limited due to lack of IV contrast. The thyroid gland, trachea, and esophagus are within normal limits. Lungs/Pleura: There are small bilateral pleural effusions. Interstitial prominence and patchy airspace disease is noted bilaterally. Subpleural reticulations are present bilaterally. No pneumothorax. Upper Abdomen: Renal calculi are noted bilaterally. No acute abnormality. Musculoskeletal: Degenerative changes are present in the thoracic spine. No acute osseous abnormality. IMPRESSION: 1. Aortic atherosclerosis without evidence of aneurysm. 2. Coronary artery calcifications and atherosclerotic calcification of the aortic annulus. 3. Interstitial and airspace opacities  bilaterally, possible edema versus pneumonitis. 4. Small bilateral pleural effusions. Electronically Signed   By: Brett Fairy M.D.   On: 08/11/2022 21:11   VAS US DOPPLER PRE CABG  Result Date: 08/11/2022 PREOPERATIVE VASCULAR EVALUATION Patient Name:  YOSIF PAULES  Date of Exam:   08/11/2022 Medical Rec #: [id number]       Accession #:    [id number] Date of Birth: 11-29-1945       Patient Gender: M Patient Age:   76 years Exam Location:  [facility] Procedure:      VAS US DOPPLER PRE CABG Referring Phys: Coralie Common --------------------------------------------------------------------------------  Indications:      Pre-CABG. Risk Factors:     Hyperlipidemia, coronary artery disease. Other Factors:    Severe aortic stenosis. Limitations:      Status post radial cath today. Bandages, IV Comparison Study: No prior study Performing Technologist: Sharion Dove RVS  Examination Guidelines: A complete evaluation includes B-mode imaging, spectral Doppler, color Doppler, and power Doppler as needed of all accessible portions of each vessel. Bilateral testing is considered an integral part of a complete examination. Limited examinations for reoccurring indications may be performed as noted.  Right Carotid Findings: +----------+--------+--------+--------+------------+------------------+           PSV cm/sEDV cm/sStenosisDescribe    Comments           +----------+--------+--------+--------+------------+------------------+ CCA Prox  57      14                          intimal thickening +----------+--------+--------+--------+------------+------------------+ CCA Distal65      12                          intimal thickening +----------+--------+--------+--------+------------+------------------+ ICA Prox  72      11              heterogenous                   +----------+--------+--------+--------+------------+------------------+ ICA Mid   53      18                                              +----------+--------+--------+--------+------------+------------------+ ICA Distal78      24                                             +----------+--------+--------+--------+------------+------------------+ ECA       81      11                                             +----------+--------+--------+--------+------------+------------------+ +----------+--------+-------+--------+------------+  PSV cm/sEDV cmsDescribeArm Pressure +----------+--------+-------+--------+------------+ Subclavian98                                  +----------+--------+-------+--------+------------+ +---------+--------+--+--------+--+ VertebralPSV cm/s52EDV cm/s14 +---------+--------+--+--------+--+ Left Carotid Findings: +----------+--------+--------+--------+------------+------------------+           PSV cm/sEDV cm/sStenosisDescribe    Comments           +----------+--------+--------+--------+------------+------------------+ CCA Prox  74      12                          intimal thickening +----------+--------+--------+--------+------------+------------------+ CCA Distal80      16                          intimal thickening +----------+--------+--------+--------+------------+------------------+ ICA Prox  97      25              heterogenous                   +----------+--------+--------+--------+------------+------------------+ ICA Mid   87      26                                             +----------+--------+--------+--------+------------+------------------+ ICA Distal78      21                                             +----------+--------+--------+--------+------------+------------------+ ECA       106     10                                             +----------+--------+--------+--------+------------+------------------+  +----------+--------+--------+---------+------------+ SubclavianPSV cm/sEDV cm/sDescribe Arm  Pressure +----------+--------+--------+---------+------------+           233             Turbulent             +----------+--------+--------+---------+------------+ +---------+--------+--+--------+--+ VertebralPSV cm/s55EDV cm/s15 +---------+--------+--+--------+--+  Right Doppler Findings: +------+--------+-----+---------+--------+ Site  PressureIndexDoppler  Comments +------+--------+-----+---------+--------+ Radial             triphasic         +------+--------+-----+---------+--------+ Ulnar              triphasic         +------+--------+-----+---------+--------+  Left Doppler Findings: +------+--------+-----+---------+--------+ Site  PressureIndexDoppler  Comments +------+--------+-----+---------+--------+ Radial             triphasic         +------+--------+-----+---------+--------+ Ulnar              triphasic         +------+--------+-----+---------+--------+  Technologist Notes: Unable to perform Allen's test secondary to radial cath done today.  Summary: Right Carotid: The extracranial vessels were near-normal with only minimal wall                thickening or plaque. Left Carotid: The extracranial vessels were near-normal with only minimal wall               thickening or  plaque. Vertebrals:  Bilateral vertebral arteries demonstrate antegrade flow. Subclavians: Left subclavian artery flow was disturbed. Normal flow hemodynamics              were seen in the right subclavian artery. Right Upper Extremity: Doppler waveforms decrease <50% with right radial compression. Doppler waveforms decrease >50% with right ulnar compression.  Electronically signed by Servando Snare MD on 08/11/2022 at 5:32:04 PM.    Final    CARDIAC CATHETERIZATION  Addendum Date: 08/11/2022   CONCLUSIONS: Severe calcific aortic stenosis with calculated aortic valve area less than 0.75 cm. 95% proximal to mid LAD followed by 70% further down the vessel.  The proximal lesion is  within a calcified segment. 70 to 80% mid nondominant RCA which supplies faint collaterals to the LAD. Mild pulmonary hypertension.  Capillary wedge pressure 15 mmHg. RECOMMENDATIONS: Surgical aortic valve replacement and LIMA to LAD and possibly the acute marginal branch of the RCA.  Result Date: 08/11/2022   Mid LAD lesion is 70% stenosed.   Prox LAD to Mid LAD lesion is 95% stenosed.   1st Sept lesion is 70% stenosed.   Prox RCA lesion is 75% stenosed.   There is mild left ventricular systolic dysfunction.   LV end diastolic pressure is normal.   LV end diastolic pressure is normal.   The left ventricular ejection fraction is 45-50% by visual estimate.   Hemodynamic findings consistent with mild pulmonary hypertension and aortic valve stenosis.   There is severe aortic valve stenosis.    Cardiac Studies   Cath: 08/11/22   CONCLUSIONS: Severe calcific aortic stenosis with calculated aortic valve area less than 0.75 cm. 95% proximal to mid LAD followed by 70% further down the vessel.  The proximal lesion is within a calcified segment. 70 to 80% mid nondominant RCA which supplies faint collaterals to the LAD. Mild pulmonary hypertension.  Capillary wedge pressure 15 mmHg.   RECOMMENDATIONS: Surgical aortic valve replacement and LIMA to LAD and possibly the acute marginal branch of the RCA.   Diagnostic Dominance: Left  Echo: 08/10/22   IMPRESSIONS     1. Left ventricular ejection fraction, by estimation, is 40 to 45%. The  left ventricle has mildly decreased function. The left ventricle  demonstrates global hypokinesis. Left ventricular diastolic parameters are  consistent with Grade III diastolic  dysfunction (restrictive). Elevated left ventricular end-diastolic  pressure.   2. Right ventricular systolic function is normal. The right ventricular  size is normal. There is mildly elevated pulmonary artery systolic  pressure. The estimated right ventricular systolic pressure is 123XX123  mmHg.   3. Left atrial size was mildly dilated.   4. The mitral valve is degenerative. Mild mitral valve regurgitation. No  evidence of mitral stenosis. The mean mitral valve gradient is 3.0 mmHg.   5. The aortic valve has an indeterminant number of cusps. There is severe  calcifcation of the aortic valve. There is severe thickening of the aortic  valve. Aortic valve regurgitation is not visualized. Severe aortic valve  stenosis. Aortic valve area, by   VTI measures 0.53 cm. Aortic valve mean gradient measures 45.0 mmHg.  Aortic valve Vmax measures 4.10 m/s.   6. The inferior vena cava is dilated in size with <50% respiratory  variability, suggesting right atrial pressure of 15 mmHg.   FINDINGS   Left Ventricle: Left ventricular ejection fraction, by estimation, is 40  to 45%. The left ventricle has mildly decreased function. The left  ventricle demonstrates global hypokinesis. The left ventricular  internal  cavity size was normal in size. There is   no left ventricular hypertrophy. Left ventricular diastolic parameters  are consistent with Grade III diastolic dysfunction (restrictive).  Elevated left ventricular end-diastolic pressure.   Right Ventricle: The right ventricular size is normal. No increase in  right ventricular wall thickness. Right ventricular systolic function is  normal. There is mildly elevated pulmonary artery systolic pressure. The  tricuspid regurgitant velocity is 2.36   m/s, and with an assumed right atrial pressure of 15 mmHg, the estimated  right ventricular systolic pressure is 123XX123 mmHg.   Left Atrium: Left atrial size was mildly dilated.   Right Atrium: Right atrial size was normal in size.   Pericardium: There is no evidence of pericardial effusion.   Mitral Valve: The mitral valve is degenerative in appearance. There is  moderate thickening of the mitral valve leaflet(s). There is mild  calcification of the mitral valve leaflet(s). Mild to moderate  mitral  annular calcification. Mild mitral valve  regurgitation. No evidence of mitral valve stenosis. MV peak gradient, 9.5  mmHg. The mean mitral valve gradient is 3.0 mmHg.   Tricuspid Valve: The tricuspid valve is normal in structure. Tricuspid  valve regurgitation is not demonstrated. No evidence of tricuspid  stenosis.   Aortic Valve: The aortic valve has an indeterminant number of cusps. There  is severe calcifcation of the aortic valve. There is severe thickening of  the aortic valve. Aortic valve regurgitation is not visualized. Severe  aortic stenosis is present. Aortic   valve mean gradient measures 45.0 mmHg. Aortic valve peak gradient  measures 67.2 mmHg. Aortic valve area, by VTI measures 0.53 cm.   Pulmonic Valve: The pulmonic valve was normal in structure. Pulmonic valve  regurgitation is not visualized. No evidence of pulmonic stenosis.   Aorta: The aortic root is normal in size and structure.   Venous: The inferior vena cava is dilated in size with less than 50%  respiratory variability, suggesting right atrial pressure of 15 mmHg.   IAS/Shunts: No atrial level shunt detected by color flow Doppler.    Patient Profile     76 y.o. male with PMH of HLD and moderate to severe AS who presented with chest pain.    Assessment & Plan    NSTEMI -- High-sensitivity troponin peaked at 2108.  Underwent cardiac catheterization noted above with calcified proximal/mid 95% LAD stenosis followed by 70% LAD stenosis, nondominant RCA with 85% mid stenosis marginal branch of RCA supplying collaterals to mid LAD.  Severe aortic valve stenosis with peak to peak gradient 67 mmHg and mean gradient of 53 mmHg.  TCTS consulted for possible CABG/AVR but due to significant ascending aortic calcification he is not felt to be a candidate for surgery. Planned for cath with PCI of LAD today. Received plavix 300mg  load 11/21.  -- Continue aspirin, plavix, Toprol 25mg  daily and IV heparin    HFrEF ICM -- LVEF of 40-45% global hypokinesis, grade 3 diastolic dysfunction, normal RV size and function -- GDMT limited in the setting of soft blood pressure, tolerating Toprol XL 25mg  daily. Consider addition of low dose ARB post cath, +/- spiro   Severe AS Mild MR Syncope -- Echo 11/19 aortic valve mean gradient measures 45 mmHg, aortic valve peak gradient measures 67.2 mmHg, aortic valve V-max 4.1 m/s -- Cath noted above with severe aortic valve stenosis with peak to peak gradient of 67 mmHg and mean gradient of 53 mmHg -- As above evaluated by TCTS, not  felt to be a candidate for surgery given aortic calcifications. Plan for potential TAVR next week   HLD -- History of statin intolerant, has tried several different types with significant myalgias and fatigue.  Also had issues with Zetia -- Would plan for outpatient lipid clinic referral for consideration of PCSK9i  For questions or updates, please contact [facility] Please consult [email or web address] for contact info under        Signed, Reino Bellis, NP  08/13/2022, 7:48 AM

## 2022-08-13 NOTE — Progress Notes (Signed)
   Heart Failure Stewardship Pharmacist Progress Note   PCP: Assunta Found, MD PCP-Cardiologist: None    HPI:  Jonathan Romero is a 76 year old male with a PMH of HLD and mild aortic stenosis who presented with sudden onset of heartburn on the left side of his chest with shortness of breath, diaphoresis, then syncope. Patient initially refused EMS, however when he had a second episode in the afternoon associated with left arm numbness EMS arrived to find him in SVT in the 280s. He was cardioverted to NSR. Troponin was elevated on admission and the patient was diagnosed with NSTEMI. Echocardiogram on 08/10/22 showed LVEF 40-45% with grade III diastolic dysfunction. Underwent LHC 08/11/22 that showed severe aortic stenosis, 95% proximal to mid LAD, 70% distal LAD, 70-80% mid RCA, mild PH, and LVEDP of 20. RHC showed CO of 4.73, CI of 2.55, PW of 27, PA of 27, RAP of 7, and PAPi of 3.85. Surgery was consulted and no longer deemed surgical candidate after CT showed significant ascending aortic calcification. Plan to proceed with TAVR and PCI to LAD.  Current HF Medications: Beta Blocker: metoprolol XL 25 mg daily  Prior to admission HF Medications: None  Pertinent Lab Values: As of 11/21: Serum creatinine 0.9, BUN 18, Potassium 4, Sodium 138, BNP 486, Magnesium 2, A1c 5.1  Vital Signs: Weight: 157 lbs (admission weight: 155.4 lbs) Blood pressure: 120-130/70s  Heart rate: 80-90s  I/O: not well documented  Medication Assistance / Insurance Benefits Check: Does the patient have prescription insurance?  yes Type of insurance plan: Medicare  Outpatient Pharmacy:  Prior to admission outpatient pharmacy: [facility] Is the patient willing to use [facility] T[facility] pharmacy at discharge? Pending Is the patient willing to transition their outpatient pharmacy to utilize a [facility] outpatient pharmacy?   Pending    Assessment: 1. 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 NYHA  class I-II symptoms. - Maintain strict I/Os, daily weights, Mg >2, and K >4.  - Euvolemic on exam with no shortness of breath or peripheral edema. - Consider starting spironolactone 12.5 mg daily after cath. BP improved. - Agree with transitioning to metoprolol XL 25 mg daily   Plan: 1) Medication changes recommended at this time: - Add spironolactone 12.5 mg daily after cath  2) Patient assistance: - Pending  3)  Education  - Initial education done, to be completed prior to discharge  Sharen Hones, PharmD, BCPS Heart Failure Engineer, building services Phone [phone]

## 2022-08-13 NOTE — Care Management Important Message (Signed)
Important Message  Patient Details  Name: VISHAAL STROLLO MRN: [id number] Date of Birth: [birth year]   Medicare Important Message Given:  Yes     Sherilyn Banker 08/13/2022, 1:55 PM

## 2022-08-13 NOTE — Progress Notes (Signed)
   08/13/22 1000  Mobility  Activity Ambulated with assistance in hallway  Level of Assistance Contact guard assist, steadying assist  Assistive Device None  Distance Ambulated (ft) 200 ft  Activity Response Tolerated well  Mobility Referral Yes  $Mobility charge 1 Mobility   Mobility Specialist Progress Note  Received pt in bed having no complaints and agreeable to mobility. Pt was asymptomatic throughout ambulation and returned to room w/o fault. Left in bed w/ call bell in reach and all needs met.    Mobility Specialist   

## 2022-08-13 NOTE — Evaluation (Signed)
Physical Therapy Evaluation Patient Details Name: Jonathan Romero MRN: [id number] DOB: 1946/08/02 Today's Date: 08/13/2022  History of Present Illness  Pt is a 76 y/o M admitted to [facility] on 08/09/22 for chest pain and syncope, workup for NSTEMI. R/L heart cath (R radial approach) 11/20. Cardiology consulted for future TAVR approach. PMH to include HLD, moderate-severe AS.   Clinical Impression  Patient evaluated by Physical Therapy with no further acute PT needs identified. All education has been completed and the patient has no further questions. Pt ambulated 100 ft supervision with no device and performed multiple turns while completing three trials of . See below for any follow-up Physical Therapy or equipment needs. PT is signing off. Thank you for this referral.  : 1.89 ft/sec     Recommendations for follow up therapy are one component of a multi-disciplinary discharge planning process, led by the attending physician.  Recommendations may be updated based on patient status, additional functional criteria and insurance authorization.  Follow Up Recommendations No PT follow up      Assistance Recommended at Discharge PRN  Patient can return home with the following  A little help with walking and/or transfers;A little help with bathing/dressing/bathroom;Assist for transportation;Help with stairs or ramp for entrance    Equipment Recommendations None recommended by PT  Recommendations for Other Services       Functional Status Assessment Patient has not had a recent decline in their functional status     Precautions / Restrictions Precautions Precautions: Fall Restrictions Weight Bearing Restrictions: No      Mobility  Bed Mobility Overal bed mobility: Modified Independent             General bed mobility comments: supine <> sit EOB mod independent for increased time and HOB elevated 9 deg, did not require use of bed rail. Pt was mindful of IV lines and moved  around them well without cues    Transfers Overall transfer level: Needs assistance Equipment used: None Transfers: Sit to/from Stand Sit to Stand: Modified independent (Device/Increase time)           General transfer comment: increased time to stand with bed at lowest height position    Ambulation/Gait Ambulation/Gait assistance: Supervision Gait Distance (Feet): 100 Feet Assistive device: None Gait Pattern/deviations: Step-to pattern, Decreased stride length Gait velocity: decreased Gait velocity interpretation: 1.31 - 2.62 ft/sec, indicative of limited community ambulator   General Gait Details: cautious gait with intermittent furniture walking in room around bed and bathroom counter; pt endorsed typically being a "fast walker" and this was slower compared to baseline; educated pt on concerns of reaching outside BOS for furniture to stabilize but unsure if education was understoof by pt  Careers information officer    Modified Rankin (Stroke Patients Only)       Balance Overall balance assessment: Needs assistance Sitting-balance support: Feet supported Sitting balance-Leahy Scale: Good     Standing balance support: No upper extremity supported Standing balance-Leahy Scale: Fair Standing balance comment: occassional stepping outside BOS but pt able to self correct without LOB                             Pertinent Vitals/Pain Pain Assessment Pain Assessment: Faces Faces Pain Scale: Hurts a little bit Pain Location: R arm wound Pain Descriptors / Indicators: Aching, Discomfort, Heaviness Pain Intervention(s): Monitored during session, Other (comment) (Notified RN of  dressing)    Home Living Family/patient expects to be discharged to:: Private residence Living Arrangements: Spouse/significant other Available Help at Discharge: Family;Available PRN/intermittently Type of Home: House Home Access: Stairs to enter Entrance  Stairs-Rails: Right Entrance Stairs-Number of Steps: 3   Home Layout: Able to live on main level with bedroom/bathroom;Two level Home Equipment: Grab bars - toilet;Grab bars - tub/shower;Rolling Walker (2 wheels);Shower seat;Other (comment) Clinical research associate x2) Additional Comments: lives at home with wife who has complex medical history with recent falls, lots of family within 10 minutes of the house in [place]    Prior Function Prior Level of Function : Independent/Modified Independent;Driving             Mobility Comments: ambulated without device in both home and in community ADLs Comments: independent for self care and IADLs, assists spouse as needed with her self care     Hand Dominance        Extremity/Trunk Assessment   Upper Extremity Assessment Upper Extremity Assessment: Overall WFL for tasks assessed (fine motor observed when self diconnecting O2 probe from tele)    Lower Extremity Assessment Lower Extremity Assessment: Overall WFL for tasks assessed (functional AROM during bed mobility)    Cervical / Trunk Assessment Cervical / Trunk Assessment: Normal  Communication   Communication: No difficulties  Cognition Arousal/Alertness: Awake/alert Behavior During Therapy: WFL for tasks assessed/performed Overall Cognitive Status: Within Functional Limits for tasks assessed                                 General Comments: Pt AOx4, discussed plan for return home and how he has help from family to assist him and wife. Discussed concerns of "furniture walking" with pt but he was mildly unaware of balance deficits.        General Comments      Exercises     Assessment/Plan    PT Assessment Patient does not need any further PT services  PT Problem List         PT Treatment Interventions      PT Goals (Current goals can be found in the Care Plan section)  Acute Rehab PT Goals Patient Stated Goal: to get back home PT Goal Formulation: With  patient Time For Goal Achievement: 08/27/22 Potential to Achieve Goals: Good    Frequency       Co-evaluation               AM-PAC PT "6 Clicks" Mobility  Outcome Measure Help needed turning from your back to your side while in a flat bed without using bedrails?: None Help needed moving from lying on your back to sitting on the side of a flat bed without using bedrails?: None Help needed moving to and from a bed to a chair (including a wheelchair)?: A Little Help needed standing up from a chair using your arms (e.g., wheelchair or bedside chair)?: A Little Help needed to walk in hospital room?: A Little Help needed climbing 3-5 steps with a railing? : A Little 6 Click Score: 20    End of Session Equipment Utilized During Treatment: Gait belt Activity Tolerance: Patient tolerated treatment well Patient left: in bed;with call bell/phone within reach;with family/visitor present Nurse Communication: Mobility status;Other (comment) (Dressing on R arm) PT Visit Diagnosis: Other abnormalities of gait and mobility (R26.89)    Time: 8341-9622 PT Time Calculation (min) (ACUTE ONLY): 20 min   Charges:  PT Evaluation $PT Eval Low Complexity: 1 Low          Chipper Oman, SPT   Ria Comment Mady Oubre 08/13/2022, 12:34 PM

## 2022-08-14 ENCOUNTER — Other Ambulatory Visit: Payer: Self-pay | Admitting: Physician Assistant

## 2022-08-14 DIAGNOSIS — I4891 Unspecified atrial fibrillation: Secondary | ICD-10-CM | POA: Insufficient documentation

## 2022-08-14 DIAGNOSIS — I471 Supraventricular tachycardia, unspecified: Secondary | ICD-10-CM | POA: Insufficient documentation

## 2022-08-14 DIAGNOSIS — I255 Ischemic cardiomyopathy: Secondary | ICD-10-CM | POA: Insufficient documentation

## 2022-08-14 DIAGNOSIS — E785 Hyperlipidemia, unspecified: Secondary | ICD-10-CM

## 2022-08-14 DIAGNOSIS — I350 Nonrheumatic aortic (valve) stenosis: Secondary | ICD-10-CM

## 2022-08-14 DIAGNOSIS — I4819 Other persistent atrial fibrillation: Secondary | ICD-10-CM | POA: Insufficient documentation

## 2022-08-14 LAB — CBC
HCT: 39.8 % (ref 39.0–52.0)
Hemoglobin: 13.3 g/dL (ref 13.0–17.0)
MCH: 33.9 pg (ref 26.0–34.0)
MCHC: 33.4 g/dL (ref 30.0–36.0)
MCV: 101.5 fL — ABNORMAL HIGH (ref 80.0–100.0)
Platelets: 160 10*3/uL (ref 150–400)
RBC: 3.92 MIL/uL — ABNORMAL LOW (ref 4.22–5.81)
RDW: 13.2 % (ref 11.5–15.5)
WBC: 8.6 10*3/uL (ref 4.0–10.5)
nRBC: 0 % (ref 0.0–0.2)

## 2022-08-14 LAB — BASIC METABOLIC PANEL
Anion gap: 10 (ref 5–15)
BUN: 12 mg/dL (ref 8–23)
CO2: 22 mmol/L (ref 22–32)
Calcium: 8.5 mg/dL — ABNORMAL LOW (ref 8.9–10.3)
Chloride: 107 mmol/L (ref 98–111)
Creatinine, Ser: 1.05 mg/dL (ref 0.61–1.24)
GFR, Estimated: >60 mL/min (ref >60)
Glucose, Bld: 124 mg/dL — ABNORMAL HIGH (ref 70–99)
Potassium: 3.9 mmol/L (ref 3.5–5.1)
Sodium: 139 mmol/L (ref 135–145)

## 2022-08-14 MED ORDER — EZETIMIBE 10 MG PO TABS
10.0000 mg | ORAL_TABLET | Freq: Every day | ORAL | Status: DC
  Administered 2022-08-14: 10 mg via ORAL
  Filled 2022-08-14: qty 1

## 2022-08-14 MED ORDER — METOPROLOL SUCCINATE ER 25 MG PO TB24: 25.0000 mg | ORAL_TABLET | Freq: Every day | ORAL | 3 refills | Status: DC

## 2022-08-14 MED ORDER — ASPIRIN 81 MG PO TBEC: 81.0000 mg | DELAYED_RELEASE_TABLET | Freq: Every day | ORAL | 12 refills | Status: DC

## 2022-08-14 MED ORDER — EZETIMIBE 10 MG PO TABS: 10.0000 mg | ORAL_TABLET | Freq: Every day | ORAL | 3 refills | Status: DC

## 2022-08-14 MED ORDER — CLOPIDOGREL BISULFATE 75 MG PO TABS: 75.0000 mg | ORAL_TABLET | Freq: Every day | ORAL | 3 refills | Status: DC

## 2022-08-14 MED ORDER — NITROGLYCERIN 0.4 MG SL SUBL: 0.4000 mg | SUBLINGUAL_TABLET | SUBLINGUAL | 2 refills | Status: DC | PRN

## 2022-08-14 NOTE — Progress Notes (Signed)
Discharge instructions given. Patient verbalized understanding and all questions were answered.  ?

## 2022-08-14 NOTE — Progress Notes (Signed)
DAILY PROGRESS NOTE   Patient Name: Jonathan Romero Date of Encounter: 08/14/2022 Cardiologist: None  Chief Complaint   No chest pain  Patient Profile   SAVAUGHN KARWOWSKI is a 76 y.o. male with limited known medical history of HLD due to not receiving regular medical care. He presented to the ED for chest pain, SOB, and syncope. He was found to have severe AS, NSTEMI, SVT, and CAD. He underwent PCI to the LAD on 11/22 with plans for TAVR during this admission.  Subjective   No chest pain overnight - feels well today. Good appetite. Plan for TAVR next Wednesday.  Objective   Vitals:   08/14/22 0117 08/14/22 0130 08/14/22 0200 08/14/22 0411  BP: 111/75 113/74 105/67 115/78  Pulse: 80 83 77 91  Resp:  19 11 15   Temp:    98.3 F (36.8 C)  TempSrc:    Oral  SpO2: 94% 94% 94% 96%  Weight:      Height:        Intake/Output Summary (Last 24 hours) at 08/14/2022 0701 Last data filed at 08/14/2022 08/16/2022 Gross per 24 hour  Intake 1530.37 ml  Output 301 ml  Net 1229.37 ml   Filed Weights   08/12/22 0535 08/13/22 0045 08/13/22 0553  Weight: (P) 70.9 kg 71.3 kg 71.3 kg    Physical Exam   General appearance: alert and no distress Neck: no carotid bruit, no JVD, and thyroid not enlarged, symmetric, no tenderness/mass/nodules Lungs: clear to auscultation bilaterally Heart: regular rate and rhythm, S1, S2 normal, and systolic murmur: systolic ejection 3/6, crescendo at 2nd right intercostal space Abdomen: soft, non-tender; bowel sounds normal; no masses,  no organomegaly Extremities: extremities normal, atraumatic, no cyanosis or edema Pulses: 2+ and symmetric Skin: Skin color, texture, turgor normal. No rashes or lesions Neurologic: Grossly normal Psych: Pleasant  Inpatient Medications    Scheduled Meds:  aspirin EC  81 mg Oral Daily   clopidogrel  75 mg Oral Daily   metoprolol succinate  25 mg Oral Daily   sodium chloride flush  3 mL Intravenous Q12H   sodium  chloride flush  3 mL Intravenous Q12H   sodium chloride flush  3 mL Intravenous Q12H    Continuous Infusions:  sodium chloride Stopped (08/11/22 1319)   sodium chloride      PRN Meds: sodium chloride, sodium chloride, acetaminophen, hydrocortisone cream, nitroGLYCERIN, ondansetron (ZOFRAN) IV, oxyCODONE, sodium chloride flush, sodium chloride flush   Labs   Results for orders placed or performed during the hospital encounter of 08/09/22 (from the past 48 hour(s))  Heparin level (unfractionated)     Status: Abnormal   Collection Time: 08/12/22 10:39 AM  Result Value Ref Range   Heparin Unfractionated 0.13 (L) 0.30 - 0.70 IU/mL    Comment: (NOTE) The clinical reportable range upper limit is being lowered to >1.10 to align with the FDA approved guidance for the current laboratory assay.  If heparin results are below expected values, and patient dosage has  been confirmed, suggest follow up testing of antithrombin III levels. Performed at [facility], [address], [place], [place]   Heparin level (unfractionated)     Status: Abnormal   Collection Time: 08/12/22  9:48 PM  Result Value Ref Range   Heparin Unfractionated 0.26 (L) 0.30 - 0.70 IU/mL    Comment: (NOTE) The clinical reportable range upper limit is being lowered to >1.10 to align with the FDA approved guidance for the current  laboratory assay.  If heparin results are below expected values, and patient dosage has  been confirmed, suggest follow up testing of antithrombin III levels. Performed at [facility], [place] [address], [place], [place] [address]   Heparin level (unfractionated)     Status: None   Collection Time: 08/13/22  1:29 AM  Result Value Ref Range   Heparin Unfractionated 0.33 0.30 - 0.70 IU/mL    Comment: (NOTE) The clinical reportable range upper limit is being lowered to >1.10 to align with the FDA approved guidance for the current laboratory assay.  If heparin results  are below expected values, and patient dosage has  been confirmed, suggest follow up testing of antithrombin III levels. Performed at [facility], [place] [address], [place], [place] [address]   CBC     Status: Abnormal   Collection Time: 08/13/22  1:29 AM  Result Value Ref Range   WBC 8.6 4.0 - 10.5 K/uL   RBC 3.67 (L) 4.22 - 5.81 MIL/uL   Hemoglobin 12.2 (L) 13.0 - 17.0 g/dL   HCT 36.3 (L) 39.0 - 52.0 %   MCV 98.9 80.0 - 100.0 fL   MCH 33.2 26.0 - 34.0 pg   MCHC 33.6 30.0 - 36.0 g/dL   RDW 13.2 11.5 - 15.5 %   Platelets 138 (L) 150 - 400 K/uL   nRBC 0.0 0.0 - 0.2 %    Comment: Performed at [facility], [place] [address], [place], [place] [address]  POCT Activated clotting time     Status: None   Collection Time: 08/13/22  4:51 PM  Result Value Ref Range   Activated Clotting Time 281 seconds    Comment: Reference range 74-137 seconds for patients not on anticoagulant therapy.  POCT Activated clotting time     Status: None   Collection Time: 08/13/22  5:14 PM  Result Value Ref Range   Activated Clotting Time 275 seconds    Comment: Reference range 74-137 seconds for patients not on anticoagulant therapy.  POCT Activated clotting time     Status: None   Collection Time: 08/13/22  5:43 PM  Result Value Ref Range   Activated Clotting Time 305 seconds    Comment: Reference range 74-137 seconds for patients not on anticoagulant therapy.  CBC     Status: Abnormal   Collection Time: 08/14/22 12:18 AM  Result Value Ref Range   WBC 8.6 4.0 - 10.5 K/uL   RBC 3.92 (L) 4.22 - 5.81 MIL/uL   Hemoglobin 13.3 13.0 - 17.0 g/dL   HCT 39.8 39.0 - 52.0 %   MCV 101.5 (H) 80.0 - 100.0 fL   MCH 33.9 26.0 - 34.0 pg   MCHC 33.4 30.0 - 36.0 g/dL   RDW 13.2 11.5 - 15.5 %   Platelets 160 150 - 400 K/uL   nRBC 0.0 0.0 - 0.2 %    Comment: Performed at [facility], [place] [address] [place], [place] [id number]  Basic metabolic panel     Status: Abnormal   Collection Time: 08/14/22 12:18  AM  Result Value Ref Range   Sodium 139 135 - 145 mmol/L   Potassium 3.9 3.5 - 5.1 mmol/L   Chloride 107 98 - 111 mmol/L   CO2 22 22 - 32 mmol/L   Glucose, Bld 124 (H) 70 - 99 mg/dL    Comment: Glucose reference range applies only to samples taken after fasting for at least 8 hours.   BUN  12 8 - 23 mg/dL   Creatinine, Ser 1.05 0.61 - 1.24 mg/dL   Calcium 8.5 (L) 8.9 - 10.3 mg/dL   GFR, Estimated >60 >60 mL/min    Comment: (NOTE) Calculated using the CKD-EPI Creatinine Equation (2021)    Anion gap 10 5 - 15    Comment: Performed at [facility] [address] [place], [place] [address]    ECG   NSR with LVH, repol abnormalities - Personally Reviewed  Telemetry   Sinus rhythm - Personally Reviewed  Radiology    CARDIAC CATHETERIZATION  Result Date: 08/13/2022   Mid LAD lesion is 70% stenosed.   Prox LAD to Mid LAD lesion is 95% stenosed.   Prox RCA lesion is 75% stenosed.   1st Sept lesion is 70% stenosed.   A drug-eluting stent was successfully placed using a SYNERGY XD 3.50X16.   A drug-eluting stent was successfully placed using a SYNERGY XD 2.75X12.   Post intervention, there is a 0% residual stenosis.   Post intervention, there is a 0% residual stenosis. Successful PCI with orbital atherectomy, PTCA, and placement of 3 overlapping drug-eluting stents from the proximal to mid LAD guided by OCT imaging (3.5 x 16 mm Synergy DES proximally, 3.0 x 20 mm Synergy DES mid vessel, 2.75 x 12 mm Synergy DES distally) Recommendations: Aspirin and clopidogrel without interruption x12 months.  Plan to proceed with TAVR next week.   CT CORONARY MORPH W/CTA COR W/SCORE W/CA W/CM &/OR WO/CM  Addendum Date: 08/12/2022   ADDENDUM REPORT: 08/12/2022 15:24 CLINICAL DATA:  Aortic Valve pathology with assessment for TAVR EXAM: Cardiac TAVR CT TECHNIQUE: The patient was scanned on a Siemens Force [id number] slice scanner. A 120 kV retrospective scan was triggered in the descending thoracic aorta at  111 HU's. Gantry rotation speed was 270 msecs and collimation was .9 mm. No beta blockade or nitro were given. The 3D data set was reconstructed in 5% intervals of the R-R cycle. Systolic and diastolic phases were analyzed on a dedicated work station using MPR, MIP and VRT modes. The patient received 95 cc of contrast. FINDINGS: Aortic Valve: Severely thickened tri-leaflet aortic valve with heavy calcification and reduced excursion the planimeter valve area is 0.924 Sq cm consistent with severe aortic stenosis Number of leaflets: 3 LVOT calcification: Presence of LVOT calcification into the intervalvular fibrosa Annular calcification: Severe due to presence of LVOT calcification, moderate otherwise Aortic Valve Calcium Score: 2749 Presence of basal septal hypertrophy: Non severe Perimembranous septal diameter: 7 mm Mitral Valve: No significant mitral annular calcification Aortic Annulus Measurements- 30% phase Major annulus diameter: 29 mm Minor annulus diameter: 21 mm Annular perimeter: 79 mm Annular area: 4.67 cm2 Aortic Root Measurements- 70% phase Sinotubular Junction: 30 mm Ascending Thoracic Aorta: 36 mm Aortic Arch: 28 mm Descending Thoracic Aorta: 25 mm Aortic atherosclerosis. Sinus of Valsalva Measurements: Right coronary cusp width: 29 mm Left coronary cusp width: 29 mm Non coronary cusp width: 28 mm Coronary Artery Height above Annulus: Left Main: 13 mm Left SoV height: 20 mm Right Coronary: 14 mm Right SoV height: 20 mm Optimum Fluoroscopic Angle for Delivery: LAO 5, CAU 8 Cusp overlay view angle: RAO 0 CAU 11 Valves for structural team consideration: 26 mm Sapien Valve 29 mm Evolut Non TAVR Valve Findings: Coronary Arteries: Normal coronary origin. Study not completed with nitroglycerin. Coronary Calcium Score: Left main: 504 Left anterior descending artery: 951 Left circumflex artery: 237 Right coronary artery: 34 Total: 1726 Percentile: 83rd for age,  sex, and race matched control. Systemic veins:  Normal anatomy Main Pulmonary artery: Normal caliber Pulmonary veins: Normal anatomy Left atrial appendage: Patent Interatrial septum: No communications Left ventricle: Normal size Left atrium: Mild dilation Right ventricle: Normal size Right atrium: Mild dilation Pericardium: Trivial anterior pericardial effusion Extra Cardiac Findings as per separate reporting. IMPRESSION: 1. Severe Aortic stenosis. Findings pertinent to TAVR procedure are detailed above. RECOMMENDATIONS: The proposed cut-off value of 1,651 AU yielded a 93 % sensitivity and 75 % specificity in grading AS severity in patients with classical low-flow, low-gradient AS. Proposed different cut-off values to define severe AS for men and women as 2,065 AU and 1,274 AU, respectively. The joint European and American recommendations for the assessment of AS consider the aortic valve calcium score as a continuum - a very high calcium score suggests severe AS and a low calcium score suggests severe AS is unlikely. Kerman Passey, et al. 2017 ESC/EACTS Guidelines for the management of valvular heart disease. Eur Heart J 9258019918 Coronary artery calcium (CAC) score is a strong predictor of incident coronary heart disease (CHD) and provides predictive information beyond traditional risk factors. CAC scoring is reasonable to use in the decision to withhold, postpone, or initiate statin therapy in intermediate-risk or selected borderline-risk asymptomatic adults (age 65-75 years and LDL-C >=70 to <190 mg/dL) who do not have diabetes or established atherosclerotic cardiovascular disease (ASCVD).* In intermediate-risk (10-year ASCVD risk >=7.5% to <20%) adults or selected borderline-risk (10-year ASCVD risk >=5% to <7.5%) adults in whom a CAC score is measured for the purpose of making a treatment decision the following recommendations have been made: If CAC = 0, it is reasonable to withhold statin therapy and reassess in 5 to 10 years, as long  as higher risk conditions are absent (diabetes mellitus, family history of premature CHD in first degree relatives (males <55 years; females <65 years), cigarette smoking, LDL >=190 mg/dL or other independent risk factors). If CAC is 1 to 99, it is reasonable to initiate statin therapy for patients >=2 years of age. If CAC is >=100 or >=75th percentile, it is reasonable to initiate statin therapy at any age. Cardiology referral should be considered for patients with CAC scores >=400 or >=75th percentile. *2018 AHA/ACC/AACVPR/AAPA/ABC/ACPM/ADA/AGS/APhA/ASPC/NLA/PCNA Guideline on the Management of Blood Cholesterol: A Report of the American College of Cardiology/American Heart Association Task Force on Clinical Practice Guidelines. J Am Coll Cardiol. 2019;73(24):3168-3209. Mahesh  Chandrasekhar Electronically Signed   By: Rudean Haskell M.D.   On: 08/12/2022 15:24   Result Date: 08/12/2022 EXAM: OVER-READ INTERPRETATION  CT CHEST The following report is a limited chest CT over-read performed by radiologist Dr. Yetta Glassman of [facility], [place] on 08/12/2022. This over-read does not include interpretation of cardiac or coronary anatomy or pathology. The cardiac TAVR interpretation by the cardiologist is attached. COMPARISON:  None Available. FINDINGS: Extracardiac findings will be described separately under dictation for contemporaneously obtained CTA chest, abdomen and pelvis. IMPRESSION: Please see separate dictation for contemporaneously obtained CTA chest, abdomen and pelvis dated 08/12/2022 for full description of relevant extracardiac findings. Electronically Signed: By: Yetta Glassman M.D. On: 08/12/2022 14:00   CT Angio Abd/Pel w/ and/or w/o  Result Date: 08/12/2022 CLINICAL DATA:  Aortic valve replacement preop evaluation EXAM: CT ANGIOGRAPHY CHEST, ABDOMEN AND PELVIS TECHNIQUE: Non-contrast CT of the chest was initially obtained. Multidetector CT imaging through the chest, abdomen  and pelvis was performed using the standard protocol during bolus administration of intravenous contrast. Multiplanar  reconstructed images and MIPs were obtained and reviewed to evaluate the vascular anatomy. RADIATION DOSE REDUCTION: This exam was performed according to the departmental dose-optimization program which includes automated exposure control, adjustment of the mA and/or kV according to patient size and/or use of iterative reconstruction technique. CONTRAST:  90mL OMNIPAQUE IOHEXOL 350 MG/ML SOLN COMPARISON:  Chest CT dated August 11, 2022 FINDINGS: CTA CHEST FINDINGS Cardiovascular: Normal heart size. Aortic valve thickening and calcifications. No pericardial effusion. Normal caliber thoracic aorta with severe atherosclerotic disease. Severe three-vessel coronary artery calcifications Mediastinum/Nodes: Esophagus and thyroid are unremarkable. No pathologically enlarged lymph nodes seen in the chest. Calcified mediastinal and right hilar lymph nodes, likely sequela of prior granulomatous infection. Lungs/Pleura: Central airways are patent. Bilateral ground-glass opacities which are most pronounced in the upper lobes. Mild septal thickening. Small bilateral pleural effusions and bibasilar atelectasis. Musculoskeletal: No chest wall abnormality. No acute or significant osseous findings. CTA ABDOMEN AND PELVIS FINDINGS Hepatobiliary: No focal liver abnormality is seen. No gallstones, gallbladder wall thickening, or biliary dilatation. Pancreas: Unremarkable. No pancreatic ductal dilatation or surrounding inflammatory changes. Spleen: Normal in size without focal abnormality. Adrenals/Urinary Tract: Bilateral adrenal glands are unremarkable. No hydronephrosis or nephrolithiasis. Excreted contrast material is seen in the bladder. Stomach/Bowel: Stomach is within normal limits. Appendix appears normal. No evidence of bowel wall thickening, distention, or inflammatory changes. Vascular/lymphatic: Normal  caliber abdominal aorta with severe calcified and noncalcified plaque. No pathologically enlarged lymph nodes seen in the abdomen or pelvis. Reproductive: Prostatomegaly with focal area of hyperenhancement of the left prostate measuring 13 x 8 mm on series 4, image 27. Other: No abdominal wall hernia or abnormality. No abdominopelvic ascites. Musculoskeletal: No acute or significant osseous findings. VASCULAR MEASUREMENTS PERTINENT TO TAVR: AORTA: Minimal Aortic Diameter-12.3 mm Severity of Aortic Calcification-severe RIGHT PELVIS: Right Common Iliac Artery - Minimal Diameter-9.8 mm Tortuosity-mild Calcification-moderate Right External Iliac Artery - Minimal Diameter-9.0 mm Tortuosity-moderate Calcification-mild Right Common Femoral Artery - Minimal Diameter-7.2 mm Tortuosity-none Calcification-mild LEFT PELVIS: Left Common Iliac Artery - Minimal Diameter-7.3 mm Tortuosity-mild Calcification-moderate Left External Iliac Artery - Minimal Diameter-9.3 mm Tortuosity-moderate Calcification-mild Left Common Femoral Artery - Minimal Diameter-7.2 mm Tortuosity-none Calcification-moderate Review of the MIP images confirms the above findings. IMPRESSION: 1. Vascular findings and measurements pertinent to potential TAVR procedure, as detailed above. 2. Thickening and calcification of the aortic valve, compatible with reported clinical history of aortic stenosis. 3. 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 4. Bilateral ground-glass opacities which are most pronounced in the upper lobes, likely due to pulmonary edema. 5. Small bilateral pleural effusions and bibasilar atelectasis. 6. Prostatomegaly with focal area of hyperenhancement of the left prostate, recommend correlation with PSA. Electronically Signed   By: Yetta Glassman M.D.   On: 08/12/2022 14:44   CT ANGIO CHEST AORTA W/CM & OR WO/CM  Result Date: 08/12/2022 CLINICAL DATA:  Aortic valve replacement preop evaluation  EXAM: CT ANGIOGRAPHY CHEST, ABDOMEN AND PELVIS TECHNIQUE: Non-contrast CT of the chest was initially obtained. Multidetector CT imaging through the chest, abdomen and pelvis was performed using the standard protocol during bolus administration of intravenous contrast. Multiplanar reconstructed images and MIPs were obtained and reviewed to evaluate the vascular anatomy. RADIATION DOSE REDUCTION: This exam was performed according to the departmental dose-optimization program which includes automated exposure control, adjustment of the mA and/or kV according to patient size and/or use of iterative reconstruction technique. CONTRAST:  34mL OMNIPAQUE IOHEXOL 350 MG/ML SOLN COMPARISON:  Chest CT dated August 11, 2022  FINDINGS: CTA CHEST FINDINGS Cardiovascular: Normal heart size. Aortic valve thickening and calcifications. No pericardial effusion. Normal caliber thoracic aorta with severe atherosclerotic disease. Severe three-vessel coronary artery calcifications Mediastinum/Nodes: Esophagus and thyroid are unremarkable. No pathologically enlarged lymph nodes seen in the chest. Calcified mediastinal and right hilar lymph nodes, likely sequela of prior granulomatous infection. Lungs/Pleura: Central airways are patent. Bilateral ground-glass opacities which are most pronounced in the upper lobes. Mild septal thickening. Small bilateral pleural effusions and bibasilar atelectasis. Musculoskeletal: No chest wall abnormality. No acute or significant osseous findings. CTA ABDOMEN AND PELVIS FINDINGS Hepatobiliary: No focal liver abnormality is seen. No gallstones, gallbladder wall thickening, or biliary dilatation. Pancreas: Unremarkable. No pancreatic ductal dilatation or surrounding inflammatory changes. Spleen: Normal in size without focal abnormality. Adrenals/Urinary Tract: Bilateral adrenal glands are unremarkable. No hydronephrosis or nephrolithiasis. Excreted contrast material is seen in the bladder. Stomach/Bowel:  Stomach is within normal limits. Appendix appears normal. No evidence of bowel wall thickening, distention, or inflammatory changes. Vascular/lymphatic: Normal caliber abdominal aorta with severe calcified and noncalcified plaque. No pathologically enlarged lymph nodes seen in the abdomen or pelvis. Reproductive: Prostatomegaly with focal area of hyperenhancement of the left prostate measuring 13 x 8 mm on series 4, image 27. Other: No abdominal wall hernia or abnormality. No abdominopelvic ascites. Musculoskeletal: No acute or significant osseous findings. VASCULAR MEASUREMENTS PERTINENT TO TAVR: AORTA: Minimal Aortic Diameter-12.3 mm Severity of Aortic Calcification-severe RIGHT PELVIS: Right Common Iliac Artery - Minimal Diameter-9.8 mm Tortuosity-mild Calcification-moderate Right External Iliac Artery - Minimal Diameter-9.0 mm Tortuosity-moderate Calcification-mild Right Common Femoral Artery - Minimal Diameter-7.2 mm Tortuosity-none Calcification-mild LEFT PELVIS: Left Common Iliac Artery - Minimal Diameter-7.3 mm Tortuosity-mild Calcification-moderate Left External Iliac Artery - Minimal Diameter-9.3 mm Tortuosity-moderate Calcification-mild Left Common Femoral Artery - Minimal Diameter-7.2 mm Tortuosity-none Calcification-moderate Review of the MIP images confirms the above findings. IMPRESSION: 1. Vascular findings and measurements pertinent to potential TAVR procedure, as detailed above. 2. Thickening and calcification of the aortic valve, compatible with reported clinical history of aortic stenosis. 3. 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 4. Bilateral ground-glass opacities which are most pronounced in the upper lobes, likely due to pulmonary edema. 5. Small bilateral pleural effusions and bibasilar atelectasis. 6. Prostatomegaly with focal area of hyperenhancement of the left prostate, recommend correlation with PSA. Electronically Signed   By: Yetta Glassman M.D.   On: 08/12/2022 14:44    Cardiac Studies   See above  Assessment   Principal Problem:   NSTEMI (non-ST elevated myocardial infarction) [facility]) Active Problems:   Chest pain   Elevated troponin   Palpitations   Syncope   Plan   Doing well without angina. Successful PCI to the LAD with 3 overlapping DES. Recommended DAPT with ASA and Plavix for 12 months. Plan for TAVR next Wednesday. According to notes and the patient, it was discussed that if he was stable he could go home and return for elective TAVR next week. Labs and vitals are stable today. Lipids showed LDL 160, he has not tolerated statins - recommend starting zetia 10 mg daily - consider PCSK9i as an outpatient. No cath site complications. [place] for d/c home today.  Time Spent Directly with Patient:  I have spent a total of 25 minutes with the patient reviewing hospital notes, telemetry, EKGs, labs and examining the patient as well as establishing an assessment and plan that was discussed personally with the patient.  > 50% of time was spent in direct patient  care.  Length of Stay:  LOS: 5 days   Pixie Casino, MD, [facility], [place] Director of the Advanced Lipid Disorders &  Cardiovascular Risk Reduction Clinic Diplomate of the American Board of Clinical Lipidology Attending Cardiologist  Direct Dial: [phone]  Fax: [phone]  Website:  [email or web address] 08/14/2022, 7:01 AM

## 2022-08-14 NOTE — Discharge Summary (Addendum)
Discharge Summary    Patient ID: Jonathan Romero MRN: [id number]; DOB: May 10, 1946  Admit date: 08/09/2022 Discharge date: 08/14/2022  PCP:  Sharilyn Sites, MD   [facility] Cardiologist:  Peter Martinique, MD   Discharge Diagnoses    Principal Problem:   NSTEMI (non-ST elevated myocardial infarction) [facility]) Active Problems:   Aortic stenosis, severe   Chest pain   Elevated troponin   Palpitations   Syncope   SVT (supraventricular tachycardia)   Ischemic cardiomyopathy   Diagnostic Studies/Procedures    Coronary stent intervention 08/13/22:   Mid LAD lesion is 70% stenosed.   Prox LAD to Mid LAD lesion is 95% stenosed.   Prox RCA lesion is 75% stenosed.   1st Sept lesion is 70% stenosed.   A drug-eluting stent was successfully placed using a SYNERGY XD 3.50X16.   A drug-eluting stent was successfully placed using a SYNERGY XD 2.75X12.   Post intervention, there is a 0% residual stenosis.   Post intervention, there is a 0% residual stenosis.   Successful PCI with orbital atherectomy, PTCA, and placement of 3 overlapping drug-eluting stents from the proximal to mid LAD guided by OCT imaging (3.5 x 16 mm Synergy DES proximally, 3.0 x 20 mm Synergy DES mid vessel, 2.75 x 12 mm Synergy DES distally)   Recommendations: Aspirin and clopidogrel without interruption x12 months.  Plan to proceed with TAVR next week.    Cath: 08/11/22   CONCLUSIONS: Severe calcific aortic stenosis with calculated aortic valve area less than 0.75 cm. 95% proximal to mid LAD followed by 70% further down the vessel.  The proximal lesion is within a calcified segment. 70 to 80% mid nondominant RCA which supplies faint collaterals to the LAD. Mild pulmonary hypertension.  Capillary wedge pressure 15 mmHg.   RECOMMENDATIONS: Surgical aortic valve replacement and LIMA to LAD and possibly the acute marginal branch of the RCA.   Diagnostic Dominance: Left  Echo: 08/10/22    IMPRESSIONS     1. Left ventricular ejection fraction, by estimation, is 40 to 45%. The  left ventricle has mildly decreased function. The left ventricle  demonstrates global hypokinesis. Left ventricular diastolic parameters are  consistent with Grade III diastolic  dysfunction (restrictive). Elevated left ventricular end-diastolic  pressure.   2. Right ventricular systolic function is normal. The right ventricular  size is normal. There is mildly elevated pulmonary artery systolic  pressure. The estimated right ventricular systolic pressure is 36.1 mmHg.   3. Left atrial size was mildly dilated.   4. The mitral valve is degenerative. Mild mitral valve regurgitation. No  evidence of mitral stenosis. The mean mitral valve gradient is 3.0 mmHg.   5. The aortic valve has an indeterminant number of cusps. There is severe  calcifcation of the aortic valve. There is severe thickening of the aortic  valve. Aortic valve regurgitation is not visualized. Severe aortic valve  stenosis. Aortic valve area, by   VTI measures 0.53 cm. Aortic valve mean gradient measures 45.0 mmHg.  Aortic valve Vmax measures 4.10 m/s.   6. The inferior vena cava is dilated in size with <50% respiratory  variability, suggesting right atrial pressure of 15 mmHg.  _____________   History of Present Illness     Jonathan Romero is a 76 y.o. male with with limited known medical history of HLD due to not receiving regular medical care. He presented to the ED for chest pain, SOB, and syncope. He was found to have severe AS, NSTEMI,  SVT, and CAD. He underwent PCI to the LAD on 11/22 with plans for TAVR during this admission.   Jonathan Romero was carrying a heavy box of food that weighed 30 pounds from his porch inside his house 11/18 AM and had a sudden onset of heartburn over the left side of his chest with associated shortness of breath.  His wife witnessed him having acute loss of consciousness following this episode where he  fell forward without head trauma. He had associated diaphoresis and a cold/clammy feeling. He only briefly had loss of consciousness and his wife called 911 however the patient adamantly refused to be evaluated so she hung up. His wife thought he had what appeared to be seizure-like activity. He had a similar episode later in the afternoon with associated left arm numbness and diaphoresis with recurrent witnessed syncope where he hit his right arm but again had no head trauma.  This time his wife called EMS who responded and on arrival found him to have SVT that was reportedly irregular and in the "280s" for which he was cardioverted to NSR with 100J shock.    VS on ED arrival: P 94, BP 133/59, RR 14, O2 100%/RA.    Labs notable for significantly elevated hsT (2108). Hb with mild anemia (12.0 from bl 14-15), no leukocytosis (WBC 6.6), mild thrombocytopenia (plt 149 from bl 190-200s). Electrolytes all WNLs. BNP elevated (486). Repeat hsT similar to prior with mild down trend (2044).    VS during my evaluation: P 97-105, BP 126/87 (100), RR 17, O2 100%/RA    On exam, Jonathan Romero thought that his initial episode was similar to "acid reflux" however his subsequent episode he did have a small amount of emesis and associated nausea.  Short windedness and dizziness in the evening was more pronounced than the morning episode.  On arrival to the emergency department his symptoms resolved shortly after receiving pain medications.  He is a distant prior smoker with a 30-40-pack-year history however quit over 40 years ago.  He is not currently on any medications.  He has had no prior coronary evaluations.  He thought that his dad had "an enlarged heart" and his mother had "minor heart problems as well as diabetes."  He denied ever having significant severe chest pain but just felt more of minor chest discomfort during the episodes with associated short windedness.  Prior to this week he has not had similar symptoms  before.  He did take a total of 3 full dose aspirin throughout the course of the day.   Hospital Course     Consultants: structural heart team  NSTEMI HST peaked at 2108. Heart catheterization with calcified 95% stenosis in the proximal to mid LAD followed by 70% LAD stenosis, nondominant RCA with 85% mid stenosis and a marginal branch of the RCA supplying collaterals to mid LAD. TCTS consulted for CABG and SAVR.  Unfortunately, CT chest showed significant calcification making open heart surgery higher risk. Through shared decision making, he underwent coronary intervention of the LAD on 08/13/22. Orbital atherectomy and placement of three overlapping DES (3.5 x 16 mm proximally, 3.0 x 20 mm mid, 2.75 x 12 mm distally). Continue ASA and plavix x 12 months. Continue toprol 25 mg daily   Severe AS Mild MR Echocardiogram 08/10/22 with AV mean gradient 45 mmHg, AV peak gradient 67.2 mmHg, AV V-max 4.1 m/s. Severe AS confirmed on right heart cath. CT chest as above, high risk for open surgical repair. He underwent LAD  PCI as above 08/13/22 and is planning to discharge and return for schedule re-admission for TAVR on 08/19/22.    Chronic systolic and diastolic heart failure ICM LVEF 40-45% wit global hypokinesis and grade 2 DD, normal RV function. GDMT limited by BP. Tolerating toprol.  Will hold off on ACEI/ARB/ARNI and MRA as well as SGLT2i until after TAVR next week.    Hyperlipidemia with LDL goalg < 70 Intolerant to statins in the past Started on 10 mg zetia and will likely need PCSK9i OP 08/10/2022: Cholesterol 215; HDL 45; LDL Cholesterol 160; Triglycerides 49; VLDL 10 LP (a) 41.6   SVT Had emergent DCCV in the field. Has maintained sinus rhythm.   Syncope Likely multifactorial given CAD, ICM, AS, and SVT. Will monitor symptoms following TAVR. Hold off on heart monitor for now. No driving.    Pt seen and examined by Dr. Debara Pickett and felt stable for discharge.    Did the patient  have an acute coronary syndrome (MI, NSTEMI, STEMI, etc) this admission?:  Yes                               AHA/ACC Clinical Performance & Quality Measures: Aspirin prescribed? - Yes ADP Receptor Inhibitor (Plavix/Clopidogrel, Brilinta/Ticagrelor or Effient/Prasugrel) prescribed (includes medically managed patients)? - Yes Beta Blocker prescribed? - Yes High Intensity Statin (Lipitor 40-30m or Crestor 20-469m prescribed? - No - intolerant EF assessed during THIS hospitalization? - Yes For EF <40%, was ACEI/ARB prescribed? - Not Applicable (EF >/= 4067%For EF <40%, Aldosterone Antagonist (Spironolactone or Eplerenone) prescribed? - Not Applicable (EF >/= 4067%Cardiac Rehab Phase II ordered (including medically managed patients)? - Yes         _____________  Discharge Vitals Blood pressure (!) 138/91, pulse 97, temperature 98.3 F (36.8 C), temperature source Oral, resp. rate 12, height _0  (1.778 m), weight 71.3 kg, SpO2 94 %.  Filed Weights   08/12/22 0535 08/13/22 0045 08/13/22 0553  Weight: (P) 70.9 kg 71.3 kg 71.3 kg    Labs & Radiologic Studies    CBC Recent Labs    08/13/22 0129 08/14/22 0018  WBC 8.6 8.6  HGB 12.2* 13.3  HCT 36.3* 39.8  MCV 98.9 101.5*  PLT 138* 16209 Basic Metabolic Panel Recent Labs    08/12/22 0043 08/14/22 0018  NA 138 139  K 4.0 3.9  CL 108 107  CO2 21* 22  GLUCOSE 106* 124*  BUN 18 12  CREATININE 0.90 1.05  CALCIUM 8.1* 8.5*   Liver Function Tests No results for input(s): "AST", "ALT", "ALKPHOS", "BILITOT", "PROT", "ALBUMIN" in the last 72 hours. No results for input(s): "LIPASE", "AMYLASE" in the last 72 hours. High Sensitivity Troponin:   Recent Labs  Lab 08/09/22 1841 08/09/22 2047  TROPONINIHS 2,108* 2,044*    BNP Invalid input(s): "POCBNP" D-Dimer No results for input(s): "DDIMER" in the last 72 hours. Hemoglobin A1C No results for input(s): "HGBA1C" in the last 72 hours. Fasting Lipid Panel No results for  input(s): "CHOL", "HDL", "LDLCALC", "TRIG", "CHOLHDL", "LDLDIRECT" in the last 72 hours. Thyroid Function Tests No results for input(s): "TSH", "T4TOTAL", "T3FREE", "THYROIDAB" in the last 72 hours.  Invalid input(s): "FREET3" _____________  CARDIAC CATHETERIZATION  Result Date: 08/13/2022   Mid LAD lesion is 70% stenosed.   Prox LAD to Mid LAD lesion is 95% stenosed.   Prox RCA lesion is 75% stenosed.   1st Sept lesion is 70% stenosed.  A drug-eluting stent was successfully placed using a SYNERGY XD 3.50X16.   A drug-eluting stent was successfully placed using a SYNERGY XD 2.75X12.   Post intervention, there is a 0% residual stenosis.   Post intervention, there is a 0% residual stenosis. Successful PCI with orbital atherectomy, PTCA, and placement of 3 overlapping drug-eluting stents from the proximal to mid LAD guided by OCT imaging (3.5 x 16 mm Synergy DES proximally, 3.0 x 20 mm Synergy DES mid vessel, 2.75 x 12 mm Synergy DES distally) Recommendations: Aspirin and clopidogrel without interruption x12 months.  Plan to proceed with TAVR next week.   CT CORONARY MORPH W/CTA COR W/SCORE W/CA W/CM &/OR WO/CM  Addendum Date: 08/12/2022   ADDENDUM REPORT: 08/12/2022 15:24 CLINICAL DATA:  Aortic Valve pathology with assessment for TAVR EXAM: Cardiac TAVR CT TECHNIQUE: The patient was scanned on a Siemens Force 518 slice scanner. A 120 kV retrospective scan was triggered in the descending thoracic aorta at 111 HU's. Gantry rotation speed was 270 msecs and collimation was .9 mm. No beta blockade or nitro were given. The 3D data set was reconstructed in 5% intervals of the R-R cycle. Systolic and diastolic phases were analyzed on a dedicated work station using MPR, MIP and VRT modes. The patient received 95 cc of contrast. FINDINGS: Aortic Valve: Severely thickened tri-leaflet aortic valve with heavy calcification and reduced excursion the planimeter valve area is 0.924 Sq cm consistent with severe aortic  stenosis Number of leaflets: 3 LVOT calcification: Presence of LVOT calcification into the intervalvular fibrosa Annular calcification: Severe due to presence of LVOT calcification, moderate otherwise Aortic Valve Calcium Score: 2749 Presence of basal septal hypertrophy: Non severe Perimembranous septal diameter: 7 mm Mitral Valve: No significant mitral annular calcification Aortic Annulus Measurements- 30% phase Major annulus diameter: 29 mm Minor annulus diameter: 21 mm Annular perimeter: 79 mm Annular area: 4.67 cm2 Aortic Root Measurements- 70% phase Sinotubular Junction: 30 mm Ascending Thoracic Aorta: 36 mm Aortic Arch: 28 mm Descending Thoracic Aorta: 25 mm Aortic atherosclerosis. Sinus of Valsalva Measurements: Right coronary cusp width: 29 mm Left coronary cusp width: 29 mm Non coronary cusp width: 28 mm Coronary Artery Height above Annulus: Left Main: 13 mm Left SoV height: 20 mm Right Coronary: 14 mm Right SoV height: 20 mm Optimum Fluoroscopic Angle for Delivery: LAO 5, CAU 8 Cusp overlay view angle: RAO 0 CAU 11 Valves for structural team consideration: 26 mm Sapien Valve 29 mm Evolut Non TAVR Valve Findings: Coronary Arteries: Normal coronary origin. Study not completed with nitroglycerin. Coronary Calcium Score: Left main: 504 Left anterior descending artery: 951 Left circumflex artery: 237 Right coronary artery: 34 Total: 1726 Percentile: 83rd for age, sex, and race matched control. Systemic veins: Normal anatomy Main Pulmonary artery: Normal caliber Pulmonary veins: Normal anatomy Left atrial appendage: Patent Interatrial septum: No communications Left ventricle: Normal size Left atrium: Mild dilation Right ventricle: Normal size Right atrium: Mild dilation Pericardium: Trivial anterior pericardial effusion Extra Cardiac Findings as per separate reporting. IMPRESSION: 1. Severe Aortic stenosis. Findings pertinent to TAVR procedure are detailed above. RECOMMENDATIONS: The proposed cut-off value of  1,651 AU yielded a 93 % sensitivity and 75 % specificity in grading AS severity in patients with classical low-flow, low-gradient AS. Proposed different cut-off values to define severe AS for men and women as 2,065 AU and 1,274 AU, respectively. The joint European and American recommendations for the assessment of AS consider the aortic valve calcium score as a continuum - a very high  calcium score suggests severe AS and a low calcium score suggests severe AS is unlikely. Kerman Passey, et al. 2017 ESC/EACTS Guidelines for the management of valvular heart disease. Eur Heart J [phone] Coronary artery calcium (CAC) score is a strong predictor of incident coronary heart disease (CHD) and provides predictive information beyond traditional risk factors. CAC scoring is reasonable to use in the decision to withhold, postpone, or initiate statin therapy in intermediate-risk or selected borderline-risk asymptomatic adults (age 84-75 years and LDL-C >=70 to <190 mg/dL) who do not have diabetes or established atherosclerotic cardiovascular disease (ASCVD).* In intermediate-risk (10-year ASCVD risk >=7.5% to <20%) adults or selected borderline-risk (10-year ASCVD risk >=5% to <7.5%) adults in whom a CAC score is measured for the purpose of making a treatment decision the following recommendations have been made: If CAC = 0, it is reasonable to withhold statin therapy and reassess in 5 to 10 years, as long as higher risk conditions are absent (diabetes mellitus, family history of premature CHD in first degree relatives (males <55 years; females <65 years), cigarette smoking, LDL >=190 mg/dL or other independent risk factors). If CAC is 1 to 99, it is reasonable to initiate statin therapy for patients >=22 years of age. If CAC is >=100 or >=75th percentile, it is reasonable to initiate statin therapy at any age. Cardiology referral should be considered for patients with CAC scores >=400 or >=75th  percentile. *2018 AHA/ACC/AACVPR/AAPA/ABC/ACPM/ADA/AGS/APhA/ASPC/NLA/PCNA Guideline on the Management of Blood Cholesterol: A Report of the American College of Cardiology/American Heart Association Task Force on Clinical Practice Guidelines. J Am Coll Cardiol. 2019;73(24):3168-3209. Mahesh  Chandrasekhar Electronically Signed   By: Rudean Haskell M.D.   On: 08/12/2022 15:24   Result Date: 08/12/2022 EXAM: OVER-READ INTERPRETATION  CT CHEST The following report is a limited chest CT over-read performed by radiologist Dr. Yetta Glassman of [facility], [place] on 08/12/2022. This over-read does not include interpretation of cardiac or coronary anatomy or pathology. The cardiac TAVR interpretation by the cardiologist is attached. COMPARISON:  None Available. FINDINGS: Extracardiac findings will be described separately under dictation for contemporaneously obtained CTA chest, abdomen and pelvis. IMPRESSION: Please see separate dictation for contemporaneously obtained CTA chest, abdomen and pelvis dated 08/12/2022 for full description of relevant extracardiac findings. Electronically Signed: By: Yetta Glassman M.D. On: 08/12/2022 14:00   CT Angio Abd/Pel w/ and/or w/o  Result Date: 08/12/2022 CLINICAL DATA:  Aortic valve replacement preop evaluation EXAM: CT ANGIOGRAPHY CHEST, ABDOMEN AND PELVIS TECHNIQUE: Non-contrast CT of the chest was initially obtained. Multidetector CT imaging through the chest, abdomen and pelvis was performed using the standard protocol during bolus administration of intravenous contrast. Multiplanar reconstructed images and MIPs were obtained and reviewed to evaluate the vascular anatomy. RADIATION DOSE REDUCTION: This exam was performed according to the departmental dose-optimization program which includes automated exposure control, adjustment of the mA and/or kV according to patient size and/or use of iterative reconstruction technique. CONTRAST:  71m OMNIPAQUE IOHEXOL  350 MG/ML SOLN COMPARISON:  Chest CT dated August 11, 2022 FINDINGS: CTA CHEST FINDINGS Cardiovascular: Normal heart size. Aortic valve thickening and calcifications. No pericardial effusion. Normal caliber thoracic aorta with severe atherosclerotic disease. Severe three-vessel coronary artery calcifications Mediastinum/Nodes: Esophagus and thyroid are unremarkable. No pathologically enlarged lymph nodes seen in the chest. Calcified mediastinal and right hilar lymph nodes, likely sequela of prior granulomatous infection. Lungs/Pleura: Central airways are patent. Bilateral ground-glass opacities which are most pronounced in the upper lobes. Mild septal thickening.  Small bilateral pleural effusions and bibasilar atelectasis. Musculoskeletal: No chest wall abnormality. No acute or significant osseous findings. CTA ABDOMEN AND PELVIS FINDINGS Hepatobiliary: No focal liver abnormality is seen. No gallstones, gallbladder wall thickening, or biliary dilatation. Pancreas: Unremarkable. No pancreatic ductal dilatation or surrounding inflammatory changes. Spleen: Normal in size without focal abnormality. Adrenals/Urinary Tract: Bilateral adrenal glands are unremarkable. No hydronephrosis or nephrolithiasis. Excreted contrast material is seen in the bladder. Stomach/Bowel: Stomach is within normal limits. Appendix appears normal. No evidence of bowel wall thickening, distention, or inflammatory changes. Vascular/lymphatic: Normal caliber abdominal aorta with severe calcified and noncalcified plaque. No pathologically enlarged lymph nodes seen in the abdomen or pelvis. Reproductive: Prostatomegaly with focal area of hyperenhancement of the left prostate measuring 13 x 8 mm on series 4, image 27. Other: No abdominal wall hernia or abnormality. No abdominopelvic ascites. Musculoskeletal: No acute or significant osseous findings. VASCULAR MEASUREMENTS PERTINENT TO TAVR: AORTA: Minimal Aortic Diameter-12.3 mm Severity of Aortic  Calcification-severe RIGHT PELVIS: Right Common Iliac Artery - Minimal Diameter-9.8 mm Tortuosity-mild Calcification-moderate Right External Iliac Artery - Minimal Diameter-9.0 mm Tortuosity-moderate Calcification-mild Right Common Femoral Artery - Minimal Diameter-7.2 mm Tortuosity-none Calcification-mild LEFT PELVIS: Left Common Iliac Artery - Minimal Diameter-7.3 mm Tortuosity-mild Calcification-moderate Left External Iliac Artery - Minimal Diameter-9.3 mm Tortuosity-moderate Calcification-mild Left Common Femoral Artery - Minimal Diameter-7.2 mm Tortuosity-none Calcification-moderate Review of the MIP images confirms the above findings. IMPRESSION: 1. Vascular findings and measurements pertinent to potential TAVR procedure, as detailed above. 2. Thickening and calcification of the aortic valve, compatible with reported clinical history of aortic stenosis. 3. 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 4. Bilateral ground-glass opacities which are most pronounced in the upper lobes, likely due to pulmonary edema. 5. Small bilateral pleural effusions and bibasilar atelectasis. 6. Prostatomegaly with focal area of hyperenhancement of the left prostate, recommend correlation with PSA. Electronically Signed   By: Yetta Glassman M.D.   On: 08/12/2022 14:44   CT ANGIO CHEST AORTA W/CM & OR WO/CM  Result Date: 08/12/2022 CLINICAL DATA:  Aortic valve replacement preop evaluation EXAM: CT ANGIOGRAPHY CHEST, ABDOMEN AND PELVIS TECHNIQUE: Non-contrast CT of the chest was initially obtained. Multidetector CT imaging through the chest, abdomen and pelvis was performed using the standard protocol during bolus administration of intravenous contrast. Multiplanar reconstructed images and MIPs were obtained and reviewed to evaluate the vascular anatomy. RADIATION DOSE REDUCTION: This exam was performed according to the departmental dose-optimization program which includes automated  exposure control, adjustment of the mA and/or kV according to patient size and/or use of iterative reconstruction technique. CONTRAST:  71m OMNIPAQUE IOHEXOL 350 MG/ML SOLN COMPARISON:  Chest CT dated August 11, 2022 FINDINGS: CTA CHEST FINDINGS Cardiovascular: Normal heart size. Aortic valve thickening and calcifications. No pericardial effusion. Normal caliber thoracic aorta with severe atherosclerotic disease. Severe three-vessel coronary artery calcifications Mediastinum/Nodes: Esophagus and thyroid are unremarkable. No pathologically enlarged lymph nodes seen in the chest. Calcified mediastinal and right hilar lymph nodes, likely sequela of prior granulomatous infection. Lungs/Pleura: Central airways are patent. Bilateral ground-glass opacities which are most pronounced in the upper lobes. Mild septal thickening. Small bilateral pleural effusions and bibasilar atelectasis. Musculoskeletal: No chest wall abnormality. No acute or significant osseous findings. CTA ABDOMEN AND PELVIS FINDINGS Hepatobiliary: No focal liver abnormality is seen. No gallstones, gallbladder wall thickening, or biliary dilatation. Pancreas: Unremarkable. No pancreatic ductal dilatation or surrounding inflammatory changes. Spleen: Normal in size without focal abnormality. Adrenals/Urinary Tract: Bilateral adrenal glands are unremarkable. No  hydronephrosis or nephrolithiasis. Excreted contrast material is seen in the bladder. Stomach/Bowel: Stomach is within normal limits. Appendix appears normal. No evidence of bowel wall thickening, distention, or inflammatory changes. Vascular/lymphatic: Normal caliber abdominal aorta with severe calcified and noncalcified plaque. No pathologically enlarged lymph nodes seen in the abdomen or pelvis. Reproductive: Prostatomegaly with focal area of hyperenhancement of the left prostate measuring 13 x 8 mm on series 4, image 27. Other: No abdominal wall hernia or abnormality. No abdominopelvic ascites.  Musculoskeletal: No acute or significant osseous findings. VASCULAR MEASUREMENTS PERTINENT TO TAVR: AORTA: Minimal Aortic Diameter-12.3 mm Severity of Aortic Calcification-severe RIGHT PELVIS: Right Common Iliac Artery - Minimal Diameter-9.8 mm Tortuosity-mild Calcification-moderate Right External Iliac Artery - Minimal Diameter-9.0 mm Tortuosity-moderate Calcification-mild Right Common Femoral Artery - Minimal Diameter-7.2 mm Tortuosity-none Calcification-mild LEFT PELVIS: Left Common Iliac Artery - Minimal Diameter-7.3 mm Tortuosity-mild Calcification-moderate Left External Iliac Artery - Minimal Diameter-9.3 mm Tortuosity-moderate Calcification-mild Left Common Femoral Artery - Minimal Diameter-7.2 mm Tortuosity-none Calcification-moderate Review of the MIP images confirms the above findings. IMPRESSION: 1. Vascular findings and measurements pertinent to potential TAVR procedure, as detailed above. 2. Thickening and calcification of the aortic valve, compatible with reported clinical history of aortic stenosis. 3. 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 4. Bilateral ground-glass opacities which are most pronounced in the upper lobes, likely due to pulmonary edema. 5. Small bilateral pleural effusions and bibasilar atelectasis. 6. Prostatomegaly with focal area of hyperenhancement of the left prostate, recommend correlation with PSA. Electronically Signed   By: Yetta Glassman M.D.   On: 08/12/2022 14:44   CT CHEST WO CONTRAST  Result Date: 08/11/2022 CLINICAL DATA:  Aortic atherosclerosis, aortic stenosis, pre preop aortic assessment. EXAM: CT CHEST WITHOUT CONTRAST TECHNIQUE: Multidetector CT imaging of the chest was performed following the standard protocol without IV contrast. RADIATION DOSE REDUCTION: This exam was performed according to the departmental dose-optimization program which includes automated exposure control, adjustment of the mA and/or kV  according to patient size and/or use of iterative reconstruction technique. COMPARISON:  None Available. FINDINGS: Cardiovascular: The heart is normal in size and there is no significant pericardial effusion. Three-vessel coronary artery calcifications are noted. There is atherosclerotic calcification of the aorta and aortic valve annulus. The ascending aorta measures 3.6 cm in diameter, the aortic arch measures 2.9 cm in the descending aorta measures 2.5 cm. The pulmonary trunk is normal in caliber. Mediastinum/Nodes: Calcified lymph nodes are noted in the mediastinum and right hilum. Few prominent lymph nodes are noted in the mediastinum measuring up to 1 cm in the subcarinal space. No axillary lymphadenopathy. Evaluation of the hilar regions is limited due to lack of IV contrast. The thyroid gland, trachea, and esophagus are within normal limits. Lungs/Pleura: There are small bilateral pleural effusions. Interstitial prominence and patchy airspace disease is noted bilaterally. Subpleural reticulations are present bilaterally. No pneumothorax. Upper Abdomen: Renal calculi are noted bilaterally. No acute abnormality. Musculoskeletal: Degenerative changes are present in the thoracic spine. No acute osseous abnormality. IMPRESSION: 1. Aortic atherosclerosis without evidence of aneurysm. 2. Coronary artery calcifications and atherosclerotic calcification of the aortic annulus. 3. Interstitial and airspace opacities bilaterally, possible edema versus pneumonitis. 4. Small bilateral pleural effusions. Electronically Signed   By: Brett Fairy M.D.   On: 08/11/2022 21:11   VAS US DOPPLER PRE CABG  Result Date: 08/11/2022 PREOPERATIVE VASCULAR EVALUATION Patient Name:  ACETON KINNEAR  Date of Exam:   08/11/2022 Medical Rec #: [id number]  Accession #:    [id number] Date of Birth: 1945-11-29       Patient Gender: M Patient Age:   40 years Exam Location:  [facility] Procedure:      VAS US DOPPLER PRE CABG  Referring Phys: Coralie Common --------------------------------------------------------------------------------  Indications:      Pre-CABG. Risk Factors:     Hyperlipidemia, coronary artery disease. Other Factors:    Severe aortic stenosis. Limitations:      Status post radial cath today. Bandages, IV Comparison Study: No prior study Performing Technologist: Sharion Dove RVS  Examination Guidelines: A complete evaluation includes B-mode imaging, spectral Doppler, color Doppler, and power Doppler as needed of all accessible portions of each vessel. Bilateral testing is considered an integral part of a complete examination. Limited examinations for reoccurring indications may be performed as noted.  Right Carotid Findings: +----------+--------+--------+--------+------------+------------------+           PSV cm/sEDV cm/sStenosisDescribe    Comments           +----------+--------+--------+--------+------------+------------------+ CCA Prox  57      14                          intimal thickening +----------+--------+--------+--------+------------+------------------+ CCA Distal65      12                          intimal thickening +----------+--------+--------+--------+------------+------------------+ ICA Prox  72      11              heterogenous                   +----------+--------+--------+--------+------------+------------------+ ICA Mid   53      18                                             +----------+--------+--------+--------+------------+------------------+ ICA Distal78      24                                             +----------+--------+--------+--------+------------+------------------+ ECA       81      11                                             +----------+--------+--------+--------+------------+------------------+ +----------+--------+-------+--------+------------+           PSV cm/sEDV cmsDescribeArm Pressure  +----------+--------+-------+--------+------------+ Subclavian98                                  +----------+--------+-------+--------+------------+ +---------+--------+--+--------+--+ VertebralPSV cm/s52EDV cm/s14 +---------+--------+--+--------+--+ Left Carotid Findings: +----------+--------+--------+--------+------------+------------------+           PSV cm/sEDV cm/sStenosisDescribe    Comments           +----------+--------+--------+--------+------------+------------------+ CCA Prox  74      12                          intimal thickening +----------+--------+--------+--------+------------+------------------+ CCA Distal80      16  intimal thickening +----------+--------+--------+--------+------------+------------------+ ICA Prox  97      25              heterogenous                   +----------+--------+--------+--------+------------+------------------+ ICA Mid   87      26                                             +----------+--------+--------+--------+------------+------------------+ ICA Distal78      21                                             +----------+--------+--------+--------+------------+------------------+ ECA       106     10                                             +----------+--------+--------+--------+------------+------------------+  +----------+--------+--------+---------+------------+ SubclavianPSV cm/sEDV cm/sDescribe Arm Pressure +----------+--------+--------+---------+------------+           233             Turbulent             +----------+--------+--------+---------+------------+ +---------+--------+--+--------+--+ VertebralPSV cm/s55EDV cm/s15 +---------+--------+--+--------+--+  Right Doppler Findings: +------+--------+-----+---------+--------+ Site  PressureIndexDoppler  Comments +------+--------+-----+---------+--------+ Radial             triphasic          +------+--------+-----+---------+--------+ Ulnar              triphasic         +------+--------+-----+---------+--------+  Left Doppler Findings: +------+--------+-----+---------+--------+ Site  PressureIndexDoppler  Comments +------+--------+-----+---------+--------+ Radial             triphasic         +------+--------+-----+---------+--------+ Ulnar              triphasic         +------+--------+-----+---------+--------+  Technologist Notes: Unable to perform Allen's test secondary to radial cath done today.  Summary: Right Carotid: The extracranial vessels were near-normal with only minimal wall                thickening or plaque. Left Carotid: The extracranial vessels were near-normal with only minimal wall               thickening or plaque. Vertebrals:  Bilateral vertebral arteries demonstrate antegrade flow. Subclavians: Left subclavian artery flow was disturbed. Normal flow hemodynamics              were seen in the right subclavian artery. Right Upper Extremity: Doppler waveforms decrease <50% with right radial compression. Doppler waveforms decrease >50% with right ulnar compression.  Electronically signed by Servando Snare MD on 08/11/2022 at 5:32:04 PM.    Final    CARDIAC CATHETERIZATION  Addendum Date: 08/11/2022   CONCLUSIONS: Severe calcific aortic stenosis with calculated aortic valve area less than 0.75 cm. 95% proximal to mid LAD followed by 70% further down the vessel.  The proximal lesion is within a calcified segment. 70 to 80% mid nondominant RCA which supplies faint collaterals to the LAD. Mild pulmonary hypertension.  Capillary wedge pressure 15 mmHg. RECOMMENDATIONS: Surgical aortic valve replacement and  LIMA to LAD and possibly the acute marginal branch of the RCA.  Result Date: 08/11/2022   Mid LAD lesion is 70% stenosed.   Prox LAD to Mid LAD lesion is 95% stenosed.   1st Sept lesion is 70% stenosed.   Prox RCA lesion is 75% stenosed.   There is mild  left ventricular systolic dysfunction.   LV end diastolic pressure is normal.   LV end diastolic pressure is normal.   The left ventricular ejection fraction is 45-50% by visual estimate.   Hemodynamic findings consistent with mild pulmonary hypertension and aortic valve stenosis.   There is severe aortic valve stenosis.   ECHOCARDIOGRAM COMPLETE  Result Date: 08/10/2022    ECHOCARDIOGRAM REPORT   Patient Name:   ISAO SELTZER Date of Exam: 08/10/2022 Medical Rec #:  [id number]      Height:       70.0 in Accession #:    [id number]     Weight:       155.4 lb Date of Birth:  03/26/1946      BSA:          1.875 m Patient Age:    41 years       BP:           155/87 mmHg Patient Gender: M              HR:           84 bpm. Exam Location:  Inpatient Procedure: 2D Echo, Color Doppler and Cardiac Doppler Indications:    NSTEMI i21.4  History:        Patient has prior history of Echocardiogram examinations, most                 recent 11/25/2018. Risk Factors:Dyslipidemia.  Sonographer:    Raquel Sarna Senior RDCS Referring Phys: [id number] Reyno  1. Left ventricular ejection fraction, by estimation, is 40 to 45%. The left ventricle has mildly decreased function. The left ventricle demonstrates global hypokinesis. Left ventricular diastolic parameters are consistent with Grade III diastolic dysfunction (restrictive). Elevated left ventricular end-diastolic pressure.  2. Right ventricular systolic function is normal. The right ventricular size is normal. There is mildly elevated pulmonary artery systolic pressure. The estimated right ventricular systolic pressure is 24.0 mmHg.  3. Left atrial size was mildly dilated.  4. The mitral valve is degenerative. Mild mitral valve regurgitation. No evidence of mitral stenosis. The mean mitral valve gradient is 3.0 mmHg.  5. The aortic valve has an indeterminant number of cusps. There is severe calcifcation of the aortic valve. There is severe thickening of the aortic  valve. Aortic valve regurgitation is not visualized. Severe aortic valve stenosis. Aortic valve area, by  VTI measures 0.53 cm. Aortic valve mean gradient measures 45.0 mmHg. Aortic valve Vmax measures 4.10 m/s.  6. The inferior vena cava is dilated in size with <50% respiratory variability, suggesting right atrial pressure of 15 mmHg. FINDINGS  Left Ventricle: Left ventricular ejection fraction, by estimation, is 40 to 45%. The left ventricle has mildly decreased function. The left ventricle demonstrates global hypokinesis. The left ventricular internal cavity size was normal in size. There is  no left ventricular hypertrophy. Left ventricular diastolic parameters are consistent with Grade III diastolic dysfunction (restrictive). Elevated left ventricular end-diastolic pressure. Right Ventricle: The right ventricular size is normal. No increase in right ventricular wall thickness. Right ventricular systolic function is normal. There is mildly elevated pulmonary artery systolic pressure. The tricuspid regurgitant  velocity is 2.36  m/s, and with an assumed right atrial pressure of 15 mmHg, the estimated right ventricular systolic pressure is 10.2 mmHg. Left Atrium: Left atrial size was mildly dilated. Right Atrium: Right atrial size was normal in size. Pericardium: There is no evidence of pericardial effusion. Mitral Valve: The mitral valve is degenerative in appearance. There is moderate thickening of the mitral valve leaflet(s). There is mild calcification of the mitral valve leaflet(s). Mild to moderate mitral annular calcification. Mild mitral valve regurgitation. No evidence of mitral valve stenosis. MV peak gradient, 9.5 mmHg. The mean mitral valve gradient is 3.0 mmHg. Tricuspid Valve: The tricuspid valve is normal in structure. Tricuspid valve regurgitation is not demonstrated. No evidence of tricuspid stenosis. Aortic Valve: The aortic valve has an indeterminant number of cusps. There is severe calcifcation  of the aortic valve. There is severe thickening of the aortic valve. Aortic valve regurgitation is not visualized. Severe aortic stenosis is present. Aortic  valve mean gradient measures 45.0 mmHg. Aortic valve peak gradient measures 67.2 mmHg. Aortic valve area, by VTI measures 0.53 cm. Pulmonic Valve: The pulmonic valve was normal in structure. Pulmonic valve regurgitation is not visualized. No evidence of pulmonic stenosis. Aorta: The aortic root is normal in size and structure. Venous: The inferior vena cava is dilated in size with less than 50% respiratory variability, suggesting right atrial pressure of 15 mmHg. IAS/Shunts: No atrial level shunt detected by color flow Doppler.  LEFT VENTRICLE PLAX 2D LVIDd:         4.75 cm      Diastology LVIDs:         3.60 cm      LV e' medial:    4.57 cm/s LV PW:         0.95 cm      LV E/e' medial:  30.6 LV IVS:        0.90 cm      LV e' lateral:   5.22 cm/s LVOT diam:     1.95 cm      LV E/e' lateral: 26.8 LV SV:         48 LV SV Index:   26 LVOT Area:     2.99 cm  LV Volumes (MOD) LV vol d, MOD A2C: 140.0 ml LV vol d, MOD A4C: 102.0 ml LV vol s, MOD A2C: 73.1 ml LV vol s, MOD A4C: 60.1 ml LV SV MOD A2C:     66.9 ml LV SV MOD A4C:     102.0 ml LV SV MOD BP:      53.5 ml RIGHT VENTRICLE RV S prime:     10.80 cm/s TAPSE (M-mode): 1.8 cm LEFT ATRIUM             Index        RIGHT ATRIUM           Index LA diam:        4.05 cm 2.16 cm/m   RA Area:     17.20 cm LA Vol (A2C):   56.0 ml 29.87 ml/m  RA Volume:   48.70 ml  25.97 ml/m LA Vol (A4C):   65.9 ml 35.15 ml/m LA Biplane Vol: 64.9 ml 34.61 ml/m  AORTIC VALVE AV Area (Vmax):    0.53 cm AV Area (Vmean):   0.51 cm AV Area (VTI):     0.53 cm AV Vmax:           410.00 cm/s AV Vmean:  322.000 cm/s AV VTI:            0.911 m AV Peak Grad:      67.2 mmHg AV Mean Grad:      45.0 mmHg LVOT Vmax:         72.30 cm/s LVOT Vmean:        55.000 cm/s LVOT VTI:          0.162 m LVOT/AV VTI ratio: 0.18  AORTA Ao Root  diam: 3.05 cm MITRAL VALVE                TRICUSPID VALVE MV Area (PHT): 4.68 cm     TR Peak grad:   22.3 mmHg MV Area VTI:   2.14 cm     TR Vmax:        236.00 cm/s MV Peak grad:  9.5 mmHg MV Mean grad:  3.0 mmHg     SHUNTS MV Vmax:       1.54 m/s     Systemic VTI:  0.16 m MV Vmean:      81.8 cm/s    Systemic Diam: 1.95 cm MV Decel Time: 162 msec MV E velocity: 140.00 cm/s MV A velocity: 53.40 cm/s MV E/A ratio:  2.62 Fransico Him MD Electronically signed by Fransico Him MD Signature Date/Time: 08/10/2022/6:05:45 PM    Final    DG Chest Portable 1 View  Result Date: 08/09/2022 CLINICAL DATA:  Chest pain and syncope EXAM: PORTABLE CHEST 1 VIEW COMPARISON:  Chest x-ray 11/25/2018 FINDINGS: The heart size and mediastinal contours are within normal limits. Both lungs are clear. The visualized skeletal structures are unremarkable. IMPRESSION: No active disease. Electronically Signed   By: Ronney Asters M.D.   On: 08/09/2022 20:04   Disposition   Pt is being discharged home today in good condition.  Follow-up Plans & Appointments     Follow-up Information     [facility]. Go in 12 day(s).   Specialty: Cardiology Why: Hospitalfollow up PLEASE bring a current medication list to appointmetn FREE valet parking, Entrance C, off [employer] information: [address] [id number] [place] [phone]               Discharge Instructions     AMB Referral to Cardiac Rehabilitation - Phase II   Complete by: As directed    Diagnosis: Coronary Stents   After initial evaluation and assessments completed: Virtual Based Care may be provided alone or in conjunction with Phase 2 Cardiac Rehab based on patient barriers.: Yes   Intensive Cardiac Rehabilitation (ICR) [place] location only OR Traditional Cardiac Rehabilitation (TCR) *If criteria for ICR are not met will enroll in TCR [facility] only): Yes   Diet - low  sodium heart healthy   Complete by: As directed    Discharge instructions   Complete by: As directed    NO DRIVING. No lifting over 5 lbs for 1 week. No sexual activity for 1 week. Keep procedure site clean & dry. If you notice increased pain, swelling, bleeding or pus, call/return!  You may shower, but no soaking baths/hot tubs/pools for 1 week.   Discharge wound care:   Complete by: As directed    Per MD instructions   Increase activity slowly   Complete by: As directed    No wound care   Complete by: As directed         Discharge Medications   Allergies as of 08/14/2022  Reactions   Statins         Medication List     STOP taking these medications    hydrochlorothiazide 25 MG tablet Commonly known as: HYDRODIURIL       TAKE these medications    aspirin EC 81 MG tablet Take 1 tablet (81 mg total) by mouth daily. Swallow whole.   clopidogrel 75 MG tablet Commonly known as: PLAVIX Take 1 tablet (75 mg total) by mouth daily.   ezetimibe 10 MG tablet Commonly known as: ZETIA Take 1 tablet (10 mg total) by mouth daily.   metoprolol succinate 25 MG 24 hr tablet Commonly known as: TOPROL-XL Take 1 tablet (25 mg total) by mouth daily.   nitroGLYCERIN 0.4 MG SL tablet Commonly known as: NITROSTAT Place 1 tablet (0.4 mg total) under the tongue every 5 (five) minutes x 3 doses as needed for chest pain.               Discharge Care Instructions  (From admission, onward)           Start     Ordered   08/14/22 0000  Discharge wound care:       Comments: Per MD instructions   08/14/22 0835               Outstanding Labs/Studies   TAVR next week  Duration of Discharge Encounter   Greater than 30 minutes including physician time.  Signed, Tami Lin Coran Dipaola, PA 08/14/2022, 8:38 AM

## 2022-08-15 ENCOUNTER — Other Ambulatory Visit (HOSPITAL_COMMUNITY): Payer: Self-pay | Admitting: *Deleted

## 2022-08-15 ENCOUNTER — Encounter: Payer: Self-pay | Admitting: Physician Assistant

## 2022-08-15 ENCOUNTER — Encounter (HOSPITAL_COMMUNITY): Payer: Self-pay | Admitting: Cardiovascular Disease

## 2022-08-15 ENCOUNTER — Telehealth (HOSPITAL_COMMUNITY): Payer: Self-pay | Admitting: *Deleted

## 2022-08-15 NOTE — Telephone Encounter (Signed)
CARDIAC REHAB PHASE I   Attempted to reach pt via phone to review post stent/MI education. Unable to reach via phone and no messaging available. Will mail written materials to address on file.   Woodroe Chen, RN BSN 08/15/2022 9:55 AM

## 2022-08-18 ENCOUNTER — Encounter (HOSPITAL_COMMUNITY): Payer: Self-pay | Admitting: Cardiovascular Disease

## 2022-08-18 ENCOUNTER — Encounter (HOSPITAL_COMMUNITY)
Admission: RE | Admit: 2022-08-18 | Discharge: 2022-08-18 | Disposition: A | Discharge status: Home / Self Care | Payer: Medicare Other | Source: Ambulatory Visit | Attending: Cardiovascular Disease | Admitting: Cardiovascular Disease

## 2022-08-18 ENCOUNTER — Ambulatory Visit (HOSPITAL_COMMUNITY)
Admission: RE | Admit: 2022-08-18 | Discharge: 2022-08-18 | Disposition: A | Discharge status: Home / Self Care | Payer: Medicare Other | Source: Ambulatory Visit | Attending: Physician Assistant | Admitting: Physician Assistant

## 2022-08-18 DIAGNOSIS — K219 Gastro-esophageal reflux disease without esophagitis: Secondary | ICD-10-CM | POA: Diagnosis not present

## 2022-08-18 DIAGNOSIS — I351 Nonrheumatic aortic (valve) insufficiency: Secondary | ICD-10-CM | POA: Diagnosis not present

## 2022-08-18 DIAGNOSIS — Z79899 Other long term (current) drug therapy: Secondary | ICD-10-CM | POA: Diagnosis not present

## 2022-08-18 DIAGNOSIS — Z87891 Personal history of nicotine dependence: Secondary | ICD-10-CM | POA: Diagnosis not present

## 2022-08-18 DIAGNOSIS — Z1152 Encounter for screening for COVID-19: Secondary | ICD-10-CM | POA: Insufficient documentation

## 2022-08-18 DIAGNOSIS — Z006 Encounter for examination for normal comparison and control in clinical research program: Secondary | ICD-10-CM | POA: Diagnosis not present

## 2022-08-18 DIAGNOSIS — Z20822 Contact with and (suspected) exposure to covid-19: Secondary | ICD-10-CM | POA: Diagnosis not present

## 2022-08-18 DIAGNOSIS — Z955 Presence of coronary angioplasty implant and graft: Secondary | ICD-10-CM | POA: Diagnosis not present

## 2022-08-18 DIAGNOSIS — I471 Supraventricular tachycardia, unspecified: Secondary | ICD-10-CM | POA: Diagnosis not present

## 2022-08-18 DIAGNOSIS — Z8249 Family history of ischemic heart disease and other diseases of the circulatory system: Secondary | ICD-10-CM | POA: Diagnosis not present

## 2022-08-18 DIAGNOSIS — I272 Pulmonary hypertension, unspecified: Secondary | ICD-10-CM | POA: Diagnosis not present

## 2022-08-18 DIAGNOSIS — E785 Hyperlipidemia, unspecified: Secondary | ICD-10-CM | POA: Diagnosis not present

## 2022-08-18 DIAGNOSIS — Z01818 Encounter for other preprocedural examination: Secondary | ICD-10-CM | POA: Insufficient documentation

## 2022-08-18 DIAGNOSIS — Z833 Family history of diabetes mellitus: Secondary | ICD-10-CM | POA: Diagnosis not present

## 2022-08-18 DIAGNOSIS — I2510 Atherosclerotic heart disease of native coronary artery without angina pectoris: Secondary | ICD-10-CM | POA: Diagnosis not present

## 2022-08-18 DIAGNOSIS — I252 Old myocardial infarction: Secondary | ICD-10-CM | POA: Diagnosis not present

## 2022-08-18 DIAGNOSIS — I350 Nonrheumatic aortic (valve) stenosis: Secondary | ICD-10-CM | POA: Diagnosis not present

## 2022-08-18 DIAGNOSIS — Z952 Presence of prosthetic heart valve: Secondary | ICD-10-CM | POA: Diagnosis not present

## 2022-08-18 DIAGNOSIS — I509 Heart failure, unspecified: Secondary | ICD-10-CM | POA: Diagnosis not present

## 2022-08-18 DIAGNOSIS — I255 Ischemic cardiomyopathy: Secondary | ICD-10-CM | POA: Diagnosis present

## 2022-08-18 LAB — URINALYSIS, ROUTINE W REFLEX MICROSCOPIC
Bilirubin Urine: NEGATIVE
Glucose, UA: NEGATIVE mg/dL
Hgb urine dipstick: NEGATIVE
Ketones, ur: NEGATIVE mg/dL
Leukocytes,Ua: NEGATIVE
Nitrite: NEGATIVE
Protein, ur: NEGATIVE mg/dL
Specific Gravity, Urine: 1.018 (ref 1.005–1.030)
pH: 5 (ref 5.0–8.0)

## 2022-08-18 LAB — SURGICAL PCR SCREEN
MRSA, PCR: NEGATIVE
Staphylococcus aureus: POSITIVE — AB

## 2022-08-18 LAB — CBC
HCT: 41 % (ref 39.0–52.0)
Hemoglobin: 13.6 g/dL (ref 13.0–17.0)
MCH: 33.8 pg (ref 26.0–34.0)
MCHC: 33.2 g/dL (ref 30.0–36.0)
MCV: 102 fL — ABNORMAL HIGH (ref 80.0–100.0)
Platelets: 257 10*3/uL (ref 150–400)
RBC: 4.02 MIL/uL — ABNORMAL LOW (ref 4.22–5.81)
RDW: 14 % (ref 11.5–15.5)
WBC: 8.5 10*3/uL (ref 4.0–10.5)
nRBC: 0 % (ref 0.0–0.2)

## 2022-08-18 LAB — PROTIME-INR
INR: 1 (ref 0.8–1.2)
Prothrombin Time: 13.5 seconds (ref 11.4–15.2)

## 2022-08-18 LAB — COMPREHENSIVE METABOLIC PANEL
ALT: 15 U/L (ref 0–44)
AST: 18 U/L (ref 15–41)
Albumin: 3.1 g/dL — ABNORMAL LOW (ref 3.5–5.0)
Alkaline Phosphatase: 82 U/L (ref 38–126)
Anion gap: 6 (ref 5–15)
BUN: 15 mg/dL (ref 8–23)
CO2: 27 mmol/L (ref 22–32)
Calcium: 8.8 mg/dL — ABNORMAL LOW (ref 8.9–10.3)
Chloride: 105 mmol/L (ref 98–111)
Creatinine, Ser: 0.96 mg/dL (ref 0.61–1.24)
GFR, Estimated: >60 mL/min (ref >60)
Glucose, Bld: 104 mg/dL — ABNORMAL HIGH (ref 70–99)
Potassium: 4.2 mmol/L (ref 3.5–5.1)
Sodium: 138 mmol/L (ref 135–145)
Total Bilirubin: 1.1 mg/dL (ref 0.3–1.2)
Total Protein: 6.8 g/dL (ref 6.5–8.1)

## 2022-08-18 LAB — SARS CORONAVIRUS 2 (TAT 6-24 HRS): SARS Coronavirus 2: NEGATIVE

## 2022-08-18 MED ORDER — POTASSIUM CHLORIDE 2 MEQ/ML IV SOLN
80.0000 meq | INTRAVENOUS | Status: DC
  Filled 2022-08-18: qty 40

## 2022-08-18 MED ORDER — NOREPINEPHRINE 4 MG/250ML-% IV SOLN
0.0000 ug/min | INTRAVENOUS | Status: AC
  Administered 2022-08-19: 2 ug/min via INTRAVENOUS
  Filled 2022-08-18: qty 250

## 2022-08-18 MED ORDER — CEFAZOLIN SODIUM-DEXTROSE 2-4 GM/100ML-% IV SOLN
2.0000 g | INTRAVENOUS | Status: AC
  Administered 2022-08-19: 2 g via INTRAVENOUS
  Filled 2022-08-18: qty 100

## 2022-08-18 MED ORDER — MAGNESIUM SULFATE 50 % IJ SOLN
40.0000 meq | INTRAMUSCULAR | Status: DC
  Filled 2022-08-18: qty 9.85

## 2022-08-18 MED ORDER — DEXMEDETOMIDINE HCL IN NACL 400 MCG/100ML IV SOLN
0.1000 ug/kg/h | INTRAVENOUS | Status: AC
  Administered 2022-08-19: 35 ug via INTRAVENOUS
  Filled 2022-08-18: qty 100

## 2022-08-18 MED ORDER — HEPARIN 30,000 UNITS/1000 ML (OHS) CELLSAVER SOLUTION
Status: DC
  Filled 2022-08-18: qty 1000

## 2022-08-18 NOTE — H&P (Signed)
[name]       [place] [address]             [phone]                                        Jonathan Romero [facility] [id number] Date of Birth: Mar 06, 1946   Referring: Tamala Julian Primary Care: Sharilyn Sites, MD Primary Cardiologist:None   Chief Complaint:       Chief Complaint  Patient presents with   Irregular Heart Beat      History of Present Illness:       Jonathan Romero is a 76 yo male with no known previous medical history as he does not routinely obtain medical care.  He presented to the Emergency Department with complaint of syncope on 08/09/2022.  The patient stated he had been feeling out of sorts and under the weather for the past 3 days or so.  The patient was carrying a box of cat food that weighed about 30 lbs from his porch.  While doing this he developed complaints of heart burn, shortness of breath, and ultimately passed out.  He suffered a similar episode of syncope while walking up the stairs.  EMS was called and on arrival the patient appeared diaphoretic, pale, and unwell on arrival.  He was noted to be hypotensive.  The patients EKG had irregular tachycardia and he underwent cardioversion with improvement in his symptoms.  Workup in the ED revealed a murmur on physical exam.  EKG obtained shows NSR with ST depressions.  His initial Troponin level was elevated at 2108.  CXR showed no evidence of pneumothorax, pleural effusion.  Cardiology consult was obtained for admission.  They placed patient on a Heparin drip.  He was started on low dose BB with pre-admit SVT.  Echocardiogram obtained showed EF of 40-45% and severe aortic stenosis.  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  Cardiothoracic surgery consultation has been requested for CABG and Aortic Valve Replacement.  The patient is currently symptom free.  He denies previous medical history, however he does not routinely seek medical  care.  He has dentures on his top teeth.  He has some of his own teeth on the bottom.  He does not know the last time he went to the Dentist.  He is a former smoker.  He smoked less than 1 pack per day for about 16 years when he was younger.  He was active prior to surgery.  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  He was up blowing leaves prior to his syncopal episode.  He has children that are able to help provide care.  His father had an enlarged heart.     Current Activity/ Functional Status: Patient is independent with mobility/ambulation, transfers, ADL's, IADL's.   Zubrod Score: At the time of surgery this patient's most appropriate activity status/level should be described as: []     0    Normal activity, no symptoms [x]     1    Restricted in physical strenuous activity but ambulatory, able to do out light work []     2    Ambulatory and capable of self care, unable to do work activities, up and about  more than 50%  Of the time                            []     3    Only limited self care, in bed greater than 50% of waking hours []     4    Completely disabled, no self care, confined to bed or chair []     5    Moribund       Past Medical History:  Diagnosis Date   Cellulitis     Dyslipidemia     Hyperlipidemia 2013    LDL particle number [id number] with LDL CALULATED at 151 and 2300 in 1988 was the small LDL particle number,which is very high- not tinterested in taking medications   Systolic murmur             Past Surgical History:  Procedure Laterality Date   CATARACT EXTRACTION       DOPPLER ECHOCARDIOGRAPHY   12/21/2012    EF 55-60%,   DOPPLER ECHOCARDIOGRAPHY   12/16/2011    MILD AORTIC STENOSIS,PEAK AND MEAN GRADIENTS OF 18 AND 8 mmHg and a valve area of around 2 square cm.    RIGHT/LEFT HEART CATH AND CORONARY ANGIOGRAPHY N/A 08/11/2022    Procedure: RIGHT/LEFT HEART CATH AND CORONARY ANGIOGRAPHY;  Surgeon: 02/20/2013, MD;  Location: [facility];   Service: Cardiovascular;  Laterality: N/A;      Social History        Tobacco Use  Smoking Status Former   Types: Cigarettes   Quit date: 04/15/1983   Years since quitting: 39.3  Smokeless Tobacco Never    Social History        Substance and Sexual Activity  Alcohol Use Yes    Comment: beer occasionally            Allergies  Allergen Reactions   Statins                 Current Facility-Administered Medications  Medication Dose Route Frequency Provider Last Rate Last Admin   0.9 %  sodium chloride infusion   Intravenous Continuous 08/13/2022, MD 75 mL/hr at 08/11/22 1015 Rate Change at 08/11/22 1015   0.9 %  sodium chloride infusion  250 mL Intravenous PRN Lyn Records, MD       acetaminophen (TYLENOL) tablet 650 mg  650 mg Oral Q4H PRN 08/13/22, MD       aspirin EC tablet 81 mg  81 mg Oral Daily 08/13/22, MD   81 mg at 08/11/22 Lyn Records   heparin ADULT infusion 100 units/mL (25000 units/246mL)  1,200 Units/hr Intravenous Continuous 08/13/22, RPH       hydrALAZINE (APRESOLINE) injection 10 mg  10 mg Intravenous Q20 Min PRN 8295, MD       labetalol (NORMODYNE) injection 10 mg  10 mg Intravenous Q10 min PRN 45m, MD       metoprolol tartrate (LOPRESSOR) tablet 12.5 mg  12.5 mg Oral BID Earnie Larsson, MD   12.5 mg at 08/11/22 1100   nitroGLYCERIN (NITROSTAT) SL tablet 0.4 mg  0.4 mg Sublingual Q5 Min x 3 PRN Lyn Records, MD       ondansetron [facility]) injection 4 mg  4 mg Intravenous Q6H PRN 08/13/22, MD       oxyCODONE (Oxy IR/ROXICODONE) immediate release tablet 5-10 mg  5-10 mg Oral Q4H PRN Belva Crome, MD       sodium chloride flush (NS) 0.9 % injection 3 mL  3 mL Intravenous Q12H Belva Crome, MD   3 mL at 08/11/22 1232   sodium chloride flush (NS) 0.9 % injection 3 mL  3 mL Intravenous PRN Belva Crome, MD   3 mL at 08/11/22 1231             Medications Prior to Admission  Medication Sig Dispense Refill Last  Dose   aspirin EC 325 MG tablet Take 325 mg by mouth once.     08/09/2022   aspirin EC 81 MG EC tablet Take 1 tablet (81 mg total) by mouth daily. 60 tablet 0 08/09/2022   hydrochlorothiazide (HYDRODIURIL) 25 MG tablet Take 1 tablet (25 mg total) by mouth daily. (Patient not taking: Reported on 08/09/2022) 30 tablet 0 More than a month           Family History  Problem Relation Age of Onset   Diabetes Mother     Heart failure Father      Review of Systems:    ROS               Cardiac Review of Systems: Y or  [    ]= no             Chest Pain [ Y   ]        Resting SOB [ N  ]      Exertional SOB  [Y  ]  Orthopnea [  ]             Pedal Edema [   ]        Palpitations [Y  ]         Syncope  [ Y ]             Presyncope [   ]               General Review of Systems: [Y] = yes [  ]=no Constitional: recent weight change [Y, without trying.. lack of appetite  ]; anorexia [  ]; fatigue Aqua.Slicker  ]; nausea [ N ]; night sweats [  ]; fever [  ]; or chills [  ]                                                               Dental: Last Dentist visit: unknown, dentures on top, several teeth remaining on botton.. no evidence of necrosis, dental caries             Eye : blurred vision [  ]; diplopia [   ]; vision changes Aqua.Slicker  ];  Amaurosis fugax[  ]; Resp: cough [  ];  wheezing[  ];  hemoptysis[  ]; shortness of breath[  ]; paroxysmal nocturnal dyspnea[  ]; dyspnea on exertion[Y  ]; or orthopnea[  ];  GI:  gallstones[  ], vomiting[ N ];  dysphagia[  ]; melena[  ];  hematochezia [  ]; heartburn[  ];   Hx of  Colonoscopy[  ]; GU: kidney stones [  ]; hematuria[  ];   dysuria [  ];  nocturia[  ];  history of  obstruction [  ]; urinary frequency [  ]             Skin: rash, swelling[N  ];, hair loss[  ];  peripheral edema[Y  ];  or itching[  ]; Musculosketetal: myalgias[  ];  joint swelling[  ];  joint erythema[  ];  joint pain[  ];  back pain[  ];             Heme/Lymph: bruising[  ];  bleeding[  ];  anemia[   ];  Neuro: TIA[  ];  headaches[ N ];  stroke[N  ];  vertigo[  ];  seizures[  ];   paresthesias[  ];  difficulty walking[N  ];             Psych:depression[  ]; anxiety[  ];             Endocrine: diabetes[ N ];  thyroid dysfunction[  ];   Physical Exam: BP 123/77   Pulse 81   Temp 98.5 F (36.9 C) (Oral)   Resp 20   Ht 5\' 10"  (1.778 m)   Wt 69.2 kg   SpO2 97%   BMI 21.88 kg/m    General appearance: alert, cooperative, and no distress Head: Normocephalic, without obvious abnormality, atraumatic Neck: no adenopathy, no JVD, supple, symmetrical, trachea midline, thyroid not enlarged, symmetric, no tenderness/mass/nodules, and + carotid bruit likely due to AS Resp: clear to auscultation bilaterally Cardio: regular rate and rhythm and +III/IV systolic murmur GI: soft, non-tender; bowel sounds normal; no masses,  no organomegaly Extremities: extremities normal, atraumatic, no cyanosis or edema Neurologic: Grossly normal   Diagnostic Studies & Laboratory data:     Recent Radiology Findings:    Imaging Results (Last 48 hours)  CARDIAC CATHETERIZATION   Addendum Date: 08/11/2022   CONCLUSIONS: Severe calcific aortic stenosis with calculated aortic valve area less than 0.75 cm. 95% proximal to mid LAD followed by 70% further down the vessel.  The proximal lesion is within a calcified segment. 70 to 80% mid nondominant RCA which supplies faint collaterals to the LAD. Mild pulmonary hypertension.  Capillary wedge pressure 15 mmHg. RECOMMENDATIONS: Surgical aortic valve replacement and LIMA to LAD and possibly the acute marginal branch of the RCA.   Result Date: 08/11/2022   Mid LAD lesion is 70% stenosed.   Prox LAD to Mid LAD lesion is 95% stenosed.   1st Sept lesion is 70% stenosed.   Prox RCA lesion is 75% stenosed.   There is mild left ventricular systolic dysfunction.   LV end diastolic pressure is normal.   LV end diastolic pressure is normal.   The left ventricular ejection fraction  is 45-50% by visual estimate.   Hemodynamic findings consistent with mild pulmonary hypertension and aortic valve stenosis.   There is severe aortic valve stenosis.    ECHOCARDIOGRAM COMPLETE   Result Date: 08/10/2022    ECHOCARDIOGRAM REPORT   Patient Name:   GEORDIE HAKE Date of Exam: 08/10/2022 Medical Rec #:  [id number]      Height:       70.0 in Accession #:    [id number]     Weight:       155.4 lb Date of Birth:  [birth year]      BSA:          1.875 m Patient Age:    76 years       BP:           155/87 mmHg Patient Gender: M  HR:           84 bpm. Exam Location:  Inpatient Procedure: 2D Echo, Color Doppler and Cardiac Doppler Indications:    NSTEMI i21.4  History:        Patient has prior history of Echocardiogram examinations, most                 recent 11/25/2018. Risk Factors:Dyslipidemia.  Sonographer:    Raquel Sarna Senior RDCS Referring Phys: [id number] [place]  1. Left ventricular ejection fraction, by estimation, is 40 to 45%. The left ventricle has mildly decreased function. The left ventricle demonstrates global hypokinesis. Left ventricular diastolic parameters are consistent with Grade III diastolic dysfunction (restrictive). Elevated left ventricular end-diastolic pressure.  2. Right ventricular systolic function is normal. The right ventricular size is normal. There is mildly elevated pulmonary artery systolic pressure. The estimated right ventricular systolic pressure is 123XX123 mmHg.  3. Left atrial size was mildly dilated.  4. The mitral valve is degenerative. Mild mitral valve regurgitation. No evidence of mitral stenosis. The mean mitral valve gradient is 3.0 mmHg.  5. The aortic valve has an indeterminant number of cusps. There is severe calcifcation of the aortic valve. There is severe thickening of the aortic valve. Aortic valve regurgitation is not visualized. Severe aortic valve stenosis. Aortic valve area, by  VTI measures 0.53 cm. Aortic valve mean gradient  measures 45.0 mmHg. Aortic valve Vmax measures 4.10 m/s.  6. The inferior vena cava is dilated in size with <50% respiratory variability, suggesting right atrial pressure of 15 mmHg. FINDINGS  Left Ventricle: Left ventricular ejection fraction, by estimation, is 40 to 45%. The left ventricle has mildly decreased function. The left ventricle demonstrates global hypokinesis. The left ventricular internal cavity size was normal in size. There is  no left ventricular hypertrophy. Left ventricular diastolic parameters are consistent with Grade III diastolic dysfunction (restrictive). Elevated left ventricular end-diastolic pressure. Right Ventricle: The right ventricular size is normal. No increase in right ventricular wall thickness. Right ventricular systolic function is normal. There is mildly elevated pulmonary artery systolic pressure. The tricuspid regurgitant velocity is 2.36  m/s, and with an assumed right atrial pressure of 15 mmHg, the estimated right ventricular systolic pressure is 123XX123 mmHg. Left Atrium: Left atrial size was mildly dilated. Right Atrium: Right atrial size was normal in size. Pericardium: There is no evidence of pericardial effusion. Mitral Valve: The mitral valve is degenerative in appearance. There is moderate thickening of the mitral valve leaflet(s). There is mild calcification of the mitral valve leaflet(s). Mild to moderate mitral annular calcification. Mild mitral valve regurgitation. No evidence of mitral valve stenosis. MV peak gradient, 9.5 mmHg. The mean mitral valve gradient is 3.0 mmHg. Tricuspid Valve: The tricuspid valve is normal in structure. Tricuspid valve regurgitation is not demonstrated. No evidence of tricuspid stenosis. Aortic Valve: The aortic valve has an indeterminant number of cusps. There is severe calcifcation of the aortic valve. There is severe thickening of the aortic valve. Aortic valve regurgitation is not visualized. Severe aortic stenosis is present.  Aortic  valve mean gradient measures 45.0 mmHg. Aortic valve peak gradient measures 67.2 mmHg. Aortic valve area, by VTI measures 0.53 cm. Pulmonic Valve: The pulmonic valve was normal in structure. Pulmonic valve regurgitation is not visualized. No evidence of pulmonic stenosis. Aorta: The aortic root is normal in size and structure. Venous: The inferior vena cava is dilated in size with less than 50% respiratory variability, suggesting right atrial pressure of 15  mmHg. IAS/Shunts: No atrial level shunt detected by color flow Doppler.  LEFT VENTRICLE PLAX 2D LVIDd:         4.75 cm      Diastology LVIDs:         3.60 cm      LV e' medial:    4.57 cm/s LV PW:         0.95 cm      LV E/e' medial:  30.6 LV IVS:        0.90 cm      LV e' lateral:   5.22 cm/s LVOT diam:     1.95 cm      LV E/e' lateral: 26.8 LV SV:         48 LV SV Index:   26 LVOT Area:     2.99 cm  LV Volumes (MOD) LV vol d, MOD A2C: 140.0 ml LV vol d, MOD A4C: 102.0 ml LV vol s, MOD A2C: 73.1 ml LV vol s, MOD A4C: 60.1 ml LV SV MOD A2C:     66.9 ml LV SV MOD A4C:     102.0 ml LV SV MOD BP:      53.5 ml RIGHT VENTRICLE RV S prime:     10.80 cm/s TAPSE (M-mode): 1.8 cm LEFT ATRIUM             Index        RIGHT ATRIUM           Index LA diam:        4.05 cm 2.16 cm/m   RA Area:     17.20 cm LA Vol (A2C):   56.0 ml 29.87 ml/m  RA Volume:   48.70 ml  25.97 ml/m LA Vol (A4C):   65.9 ml 35.15 ml/m LA Biplane Vol: 64.9 ml 34.61 ml/m  AORTIC VALVE AV Area (Vmax):    0.53 cm AV Area (Vmean):   0.51 cm AV Area (VTI):     0.53 cm AV Vmax:           410.00 cm/s AV Vmean:          322.000 cm/s AV VTI:            0.911 m AV Peak Grad:      67.2 mmHg AV Mean Grad:      45.0 mmHg LVOT Vmax:         72.30 cm/s LVOT Vmean:        55.000 cm/s LVOT VTI:          0.162 m LVOT/AV VTI ratio: 0.18  AORTA Ao Root diam: 3.05 cm MITRAL VALVE                TRICUSPID VALVE MV Area (PHT): 4.68 cm     TR Peak grad:   22.3 mmHg MV Area VTI:   2.14 cm     TR Vmax:         236.00 cm/s MV Peak grad:  9.5 mmHg MV Mean grad:  3.0 mmHg     SHUNTS MV Vmax:       1.54 m/s     Systemic VTI:  0.16 m MV Vmean:      81.8 cm/s    Systemic Diam: 1.95 cm MV Decel Time: 162 msec MV E velocity: 140.00 cm/s MV A velocity: 53.40 cm/s MV E/A ratio:  2.62 Armanda Magicraci Turner MD Electronically signed by Armanda Magicraci Turner MD Signature Date/Time: 08/10/2022/6:05:45 PM    Final  DG Chest Portable 1 View   Result Date: 08/09/2022 CLINICAL DATA:  Chest pain and syncope EXAM: PORTABLE CHEST 1 VIEW COMPARISON:  Chest x-ray 11/25/2018 FINDINGS: The heart size and mediastinal contours are within normal limits. Both lungs are clear. The visualized skeletal structures are unremarkable. IMPRESSION: No active disease. Electronically Signed   By: Ronney Asters M.D.   On: 08/09/2022 20:04       I have independently reviewed the above radiologic studies and discussed with the patient    Recent Lab Findings: Recent Labs       Lab Results  Component Value Date    WBC 9.2 08/11/2022    HGB 12.8 (L) 08/11/2022    HCT 38.6 (L) 08/11/2022    PLT 147 (L) 08/11/2022    GLUCOSE 147 (H) 08/11/2022    CHOL 215 (H) 08/10/2022    TRIG 49 08/10/2022    HDL 45 08/10/2022    LDLCALC 160 (H) 08/10/2022    ALT 19 08/09/2022    AST 40 08/09/2022    NA 141 08/11/2022    K 3.8 08/11/2022    CL 109 08/11/2022    CREATININE 1.11 08/11/2022    BUN 24 (H) 08/11/2022    CO2 23 08/11/2022    TSH 2.157 08/09/2022    INR 1.1 08/09/2022    HGBA1C 5.1 08/10/2022        Assessment / Plan:      Severe Aortic Stenosis- patient with syncopal episode on presentation CAD- single vessel LAD HLD- intolerant of statins, referral to Lipid clinic at discharge for possible PKSC9 Inhibitor Weight loss of 90 lbs- per patient was told this is normal, ? Cause? However doesn't appear malnourished Dispo- patient stable, has no symptoms currently.. he is agreeable to proceed with surgery.  Have ordered CT of chest to evaluate Aorta.   His remaining teeth appear free of dental caries/infection.. no further dental clearance indicated.  Dr. Lavonna Monarch to evaluate for CABG/AVR this Wednesday.   I  spent 60 minutes counseling the patient face to face.     Ellwood Handler, PA-C 08/11/2022 12:36 PM   I have reviewed the above and agree with the findings.  A very healthy 76 yo wm with syncope and SVT found to have severe AS and CAD. Pt has been in NSR and echo with normal LV function and severe AS with a mean gradient of 74mmHg and cath with LAD and RCA stenosis. We are awaiting the noncontrast CT scan of chest and if no significant calcification, we will offer SAVR/CAB instead of TAVR/PCI. I have discussed with the pt and with the daughter present the risks and goals of surgery and the expected recovery and restrictions that will follow. He understands and wishes to proceed.

## 2022-08-18 NOTE — Progress Notes (Signed)
Patient signed all consents at PAT lab appointment. CHG soap and instructions were given to patient. CHG surgical prep reviewed with patient and all questions answered.  

## 2022-08-19 ENCOUNTER — Encounter (HOSPITAL_COMMUNITY)
Admission: RE | Disposition: A | Discharge status: Home / Self Care | Payer: Self-pay | Source: Home / Self Care | Attending: Thoracic Surgery (Cardiothoracic Vascular Surgery)

## 2022-08-19 ENCOUNTER — Inpatient Hospital Stay (HOSPITAL_COMMUNITY): Payer: Medicare Other | Admitting: Vascular Surgery

## 2022-08-19 ENCOUNTER — Inpatient Hospital Stay (HOSPITAL_COMMUNITY)
Admission: RE | Admit: 2022-08-19 | Discharge: 2022-08-20 | DRG: 267 | Disposition: A | Discharge status: Home / Self Care | Payer: Medicare Other | Attending: Thoracic Surgery (Cardiothoracic Vascular Surgery) | Admitting: Thoracic Surgery (Cardiothoracic Vascular Surgery)

## 2022-08-19 ENCOUNTER — Other Ambulatory Visit: Payer: Self-pay | Admitting: Cardiology

## 2022-08-19 ENCOUNTER — Encounter (HOSPITAL_COMMUNITY): Payer: Self-pay | Admitting: Cardiovascular Disease

## 2022-08-19 ENCOUNTER — Inpatient Hospital Stay (HOSPITAL_COMMUNITY): Payer: Medicare Other

## 2022-08-19 ENCOUNTER — Other Ambulatory Visit: Payer: Self-pay

## 2022-08-19 DIAGNOSIS — I471 Supraventricular tachycardia, unspecified: Secondary | ICD-10-CM | POA: Diagnosis present

## 2022-08-19 DIAGNOSIS — Z952 Presence of prosthetic heart valve: Principal | ICD-10-CM

## 2022-08-19 DIAGNOSIS — Z006 Encounter for examination for normal comparison and control in clinical research program: Secondary | ICD-10-CM

## 2022-08-19 DIAGNOSIS — I350 Nonrheumatic aortic (valve) stenosis: Secondary | ICD-10-CM

## 2022-08-19 DIAGNOSIS — Z955 Presence of coronary angioplasty implant and graft: Secondary | ICD-10-CM | POA: Diagnosis not present

## 2022-08-19 DIAGNOSIS — K219 Gastro-esophageal reflux disease without esophagitis: Secondary | ICD-10-CM | POA: Diagnosis present

## 2022-08-19 DIAGNOSIS — I255 Ischemic cardiomyopathy: Secondary | ICD-10-CM | POA: Diagnosis present

## 2022-08-19 DIAGNOSIS — Z79899 Other long term (current) drug therapy: Secondary | ICD-10-CM | POA: Diagnosis not present

## 2022-08-19 DIAGNOSIS — I4819 Other persistent atrial fibrillation: Secondary | ICD-10-CM | POA: Diagnosis present

## 2022-08-19 DIAGNOSIS — I272 Pulmonary hypertension, unspecified: Secondary | ICD-10-CM | POA: Diagnosis present

## 2022-08-19 DIAGNOSIS — E785 Hyperlipidemia, unspecified: Secondary | ICD-10-CM | POA: Diagnosis present

## 2022-08-19 DIAGNOSIS — I2510 Atherosclerotic heart disease of native coronary artery without angina pectoris: Secondary | ICD-10-CM | POA: Diagnosis present

## 2022-08-19 DIAGNOSIS — R55 Syncope and collapse: Secondary | ICD-10-CM | POA: Diagnosis present

## 2022-08-19 DIAGNOSIS — I252 Old myocardial infarction: Secondary | ICD-10-CM

## 2022-08-19 DIAGNOSIS — I351 Nonrheumatic aortic (valve) insufficiency: Secondary | ICD-10-CM

## 2022-08-19 DIAGNOSIS — I4891 Unspecified atrial fibrillation: Secondary | ICD-10-CM | POA: Diagnosis present

## 2022-08-19 DIAGNOSIS — Z87891 Personal history of nicotine dependence: Secondary | ICD-10-CM

## 2022-08-19 DIAGNOSIS — Z8249 Family history of ischemic heart disease and other diseases of the circulatory system: Secondary | ICD-10-CM | POA: Diagnosis not present

## 2022-08-19 DIAGNOSIS — Z833 Family history of diabetes mellitus: Secondary | ICD-10-CM

## 2022-08-19 DIAGNOSIS — I509 Heart failure, unspecified: Secondary | ICD-10-CM

## 2022-08-19 DIAGNOSIS — Z20822 Contact with and (suspected) exposure to covid-19: Secondary | ICD-10-CM | POA: Diagnosis present

## 2022-08-19 DIAGNOSIS — I214 Non-ST elevation (NSTEMI) myocardial infarction: Secondary | ICD-10-CM | POA: Diagnosis present

## 2022-08-19 HISTORY — DX: Presence of prosthetic heart valve: Z95.2

## 2022-08-19 HISTORY — DX: Acute myocardial infarction, unspecified: I21.9

## 2022-08-19 HISTORY — PX: INTRAOPERATIVE TRANSTHORACIC ECHOCARDIOGRAM: SHX6523

## 2022-08-19 HISTORY — PX: TRANSCATHETER AORTIC VALVE REPLACEMENT, TRANSFEMORAL: SHX6400

## 2022-08-19 HISTORY — DX: Nonrheumatic aortic (valve) stenosis: I35.0

## 2022-08-19 HISTORY — DX: Atherosclerotic heart disease of native coronary artery without angina pectoris: I25.10

## 2022-08-19 HISTORY — DX: Gastro-esophageal reflux disease without esophagitis: K21.9

## 2022-08-19 HISTORY — DX: Unspecified osteoarthritis, unspecified site: M19.90

## 2022-08-19 LAB — ECHOCARDIOGRAM LIMITED
AR max vel: 1.59 cm2
AV Area VTI: 1.51 cm2
AV Area mean vel: 1.86 cm2
AV Mean grad: 4 mmHg
AV Peak grad: 8.2 mmHg
Ao pk vel: 1.43 m/s

## 2022-08-19 LAB — TYPE AND SCREEN
ABO/RH(D): O POS
Antibody Screen: NEGATIVE
Unit division: 0
Unit division: 0
Unit division: 0
Unit division: 0

## 2022-08-19 LAB — POCT I-STAT, CHEM 8
BUN: 16 mg/dL (ref 8–23)
BUN: 15 mg/dL (ref 8–23)
Calcium, Ion: 1.05 mmol/L — ABNORMAL LOW (ref 1.15–1.40)
Calcium, Ion: 1.25 mmol/L (ref 1.15–1.40)
Chloride: 105 mmol/L (ref 98–111)
Chloride: 104 mmol/L (ref 98–111)
Creatinine, Ser: 0.8 mg/dL (ref 0.61–1.24)
Creatinine, Ser: 0.7 mg/dL (ref 0.61–1.24)
Glucose, Bld: 101 mg/dL — ABNORMAL HIGH (ref 70–99)
Glucose, Bld: 107 mg/dL — ABNORMAL HIGH (ref 70–99)
HCT: 34 % — ABNORMAL LOW (ref 39.0–52.0)
HCT: 34 % — ABNORMAL LOW (ref 39.0–52.0)
Hemoglobin: 11.6 g/dL — ABNORMAL LOW (ref 13.0–17.0)
Hemoglobin: 11.6 g/dL — ABNORMAL LOW (ref 13.0–17.0)
Potassium: 4.3 mmol/L (ref 3.5–5.1)
Potassium: 4.2 mmol/L (ref 3.5–5.1)
Sodium: 139 mmol/L (ref 135–145)
Sodium: 140 mmol/L (ref 135–145)
TCO2: 25 mmol/L (ref 22–32)
TCO2: 25 mmol/L (ref 22–32)

## 2022-08-19 LAB — BPAM RBC
Blood Product Expiration Date: 202312212359
Blood Product Expiration Date: 202312212359
Blood Product Expiration Date: 202312212359
Blood Product Expiration Date: 202312212359
ISSUE DATE / TIME: 202311281526
ISSUE DATE / TIME: 202311281526
ISSUE DATE / TIME: 202311281526
ISSUE DATE / TIME: 202311281526
Unit Type and Rh: 5100
Unit Type and Rh: 5100
Unit Type and Rh: 5100
Unit Type and Rh: 5100

## 2022-08-19 LAB — ABO/RH: ABO/RH(D): O POS

## 2022-08-19 SURGERY — IMPLANTATION, AORTIC VALVE, TRANSCATHETER, FEMORAL APPROACH
Anesthesia: Monitor Anesthesia Care | Site: Chest

## 2022-08-19 MED ORDER — HEPARIN 6000 UNIT IRRIGATION SOLUTION
Status: DC | PRN
  Administered 2022-08-19 (×3): 1

## 2022-08-19 MED ORDER — TRAMADOL HCL 50 MG PO TABS: 50.0000 mg | ORAL_TABLET | ORAL | Status: DC | PRN

## 2022-08-19 MED ORDER — HEPARIN 6000 UNIT IRRIGATION SOLUTION
Status: AC
  Filled 2022-08-19: qty 1500

## 2022-08-19 MED ORDER — NITROGLYCERIN IN D5W 200-5 MCG/ML-% IV SOLN
0.0000 ug/min | INTRAVENOUS | Status: DC
  Filled 2022-08-19: qty 250

## 2022-08-19 MED ORDER — 0.9 % SODIUM CHLORIDE (POUR BTL) OPTIME
TOPICAL | Status: DC | PRN
  Administered 2022-08-19: 1000 mL

## 2022-08-19 MED ORDER — ASPIRIN 81 MG PO TBEC
81.0000 mg | DELAYED_RELEASE_TABLET | Freq: Every day | ORAL | Status: DC
  Administered 2022-08-19 – 2022-08-20 (×2): 81 mg via ORAL
  Filled 2022-08-19 (×2): qty 1

## 2022-08-19 MED ORDER — OXYCODONE HCL 5 MG PO TABS: 5.0000 mg | ORAL_TABLET | ORAL | Status: DC | PRN

## 2022-08-19 MED ORDER — ACETAMINOPHEN 650 MG RE SUPP: 650.0000 mg | Freq: Four times a day (QID) | RECTAL | Status: DC | PRN

## 2022-08-19 MED ORDER — CHLORHEXIDINE GLUCONATE 0.12 % MT SOLN: 15.0000 mL | Freq: Once | OROMUCOSAL | Status: AC

## 2022-08-19 MED ORDER — PROTAMINE SULFATE 10 MG/ML IV SOLN
INTRAVENOUS | Status: AC
  Filled 2022-08-19: qty 5

## 2022-08-19 MED ORDER — LIDOCAINE HCL 1 % IJ SOLN
INTRAMUSCULAR | Status: AC
  Filled 2022-08-19: qty 20

## 2022-08-19 MED ORDER — LACTATED RINGERS IV SOLN: INTRAVENOUS | Status: DC | PRN

## 2022-08-19 MED ORDER — CHLORHEXIDINE GLUCONATE 0.12 % MT SOLN
OROMUCOSAL | Status: AC
  Administered 2022-08-19: 15 mL via OROMUCOSAL
  Filled 2022-08-19: qty 15

## 2022-08-19 MED ORDER — IODIXANOL 320 MG/ML IV SOLN
INTRAVENOUS | Status: DC | PRN
  Administered 2022-08-19: 100 mL

## 2022-08-19 MED ORDER — METOPROLOL SUCCINATE ER 25 MG PO TB24
25.0000 mg | ORAL_TABLET | Freq: Every day | ORAL | Status: DC
  Administered 2022-08-20: 25 mg via ORAL
  Filled 2022-08-19: qty 1

## 2022-08-19 MED ORDER — SODIUM CHLORIDE 0.9% FLUSH
3.0000 mL | Freq: Two times a day (BID) | INTRAVENOUS | Status: DC
  Administered 2022-08-20 (×2): 3 mL via INTRAVENOUS

## 2022-08-19 MED ORDER — EZETIMIBE 10 MG PO TABS
10.0000 mg | ORAL_TABLET | Freq: Every day | ORAL | Status: DC
  Administered 2022-08-19 – 2022-08-20 (×2): 10 mg via ORAL
  Filled 2022-08-19 (×2): qty 1

## 2022-08-19 MED ORDER — LIDOCAINE HCL 1 % IJ SOLN
INTRAMUSCULAR | Status: DC | PRN
  Administered 2022-08-19: 20 mL via INTRADERMAL

## 2022-08-19 MED ORDER — CHLORHEXIDINE GLUCONATE 0.12 % MT SOLN: 15.0000 mL | Freq: Once | OROMUCOSAL | Status: DC

## 2022-08-19 MED ORDER — SODIUM CHLORIDE 0.9 % IV SOLN: 250.0000 mL | INTRAVENOUS | Status: DC | PRN

## 2022-08-19 MED ORDER — LACTATED RINGERS IV SOLN: INTRAVENOUS | Status: DC

## 2022-08-19 MED ORDER — SODIUM CHLORIDE 0.9 % IV SOLN: INTRAVENOUS | Status: AC

## 2022-08-19 MED ORDER — CEFAZOLIN SODIUM-DEXTROSE 2-4 GM/100ML-% IV SOLN
2.0000 g | Freq: Three times a day (TID) | INTRAVENOUS | Status: AC
  Administered 2022-08-19 – 2022-08-20 (×2): 2 g via INTRAVENOUS
  Filled 2022-08-19 (×2): qty 100

## 2022-08-19 MED ORDER — ONDANSETRON HCL 4 MG/2ML IJ SOLN: 4.0000 mg | Freq: Four times a day (QID) | INTRAMUSCULAR | Status: DC | PRN

## 2022-08-19 MED ORDER — ACETAMINOPHEN 325 MG PO TABS: 650.0000 mg | ORAL_TABLET | Freq: Four times a day (QID) | ORAL | Status: DC | PRN

## 2022-08-19 MED ORDER — CLOPIDOGREL BISULFATE 75 MG PO TABS
75.0000 mg | ORAL_TABLET | Freq: Every day | ORAL | Status: DC
  Administered 2022-08-19 – 2022-08-20 (×2): 75 mg via ORAL
  Filled 2022-08-19 (×2): qty 1

## 2022-08-19 MED ORDER — SODIUM CHLORIDE 0.9% FLUSH: 3.0000 mL | INTRAVENOUS | Status: DC | PRN

## 2022-08-19 MED ORDER — CHLORHEXIDINE GLUCONATE 4 % EX LIQD: 60.0000 mL | Freq: Once | CUTANEOUS | Status: DC

## 2022-08-19 MED ORDER — SODIUM CHLORIDE 0.9 % IV SOLN: INTRAVENOUS | Status: DC

## 2022-08-19 MED ORDER — CLEVIDIPINE BUTYRATE 0.5 MG/ML IV EMUL
INTRAVENOUS | Status: DC | PRN
  Administered 2022-08-19: 2 mg/h via INTRAVENOUS

## 2022-08-19 MED ORDER — CHLORHEXIDINE GLUCONATE 4 % EX LIQD: 30.0000 mL | CUTANEOUS | Status: DC

## 2022-08-19 MED ORDER — HEPARIN SODIUM (PORCINE) 1000 UNIT/ML IJ SOLN
INTRAMUSCULAR | Status: DC | PRN
  Administered 2022-08-19: 12000 [IU] via INTRAVENOUS

## 2022-08-19 MED ORDER — ORAL CARE MOUTH RINSE: 15.0000 mL | Freq: Once | OROMUCOSAL | Status: AC

## 2022-08-19 MED ORDER — PROTAMINE SULFATE 10 MG/ML IV SOLN
INTRAVENOUS | Status: DC | PRN
  Administered 2022-08-19: 120 mg via INTRAVENOUS

## 2022-08-19 MED ORDER — MORPHINE SULFATE (PF) 2 MG/ML IV SOLN: 1.0000 mg | INTRAVENOUS | Status: DC | PRN

## 2022-08-19 SURGICAL SUPPLY — 67 items
BAG COUNTER SPONGE SURGICOUNT (BAG) ×2 IMPLANT
BAG DECANTER FOR FLEXI CONT (MISCELLANEOUS) IMPLANT
BAG SNAP BAND KOVER 36X36 (MISCELLANEOUS) IMPLANT
CABLE ADAPT CONN TEMP 6FT (ADAPTER) ×2 IMPLANT
CANISTER SUCT 3000ML PPV (MISCELLANEOUS) IMPLANT
CATH DIAG EXPO 6F AL1 (CATHETERS) IMPLANT
CATH DIAG EXPO 6F VENT PIG 145 (CATHETERS) ×4 IMPLANT
CATH INFINITI 6F AL2 (CATHETERS) IMPLANT
CATH S G BIP PACING (CATHETERS) ×2 IMPLANT
CHLORAPREP W/TINT 26 (MISCELLANEOUS) ×2 IMPLANT
CLOSURE MYNX CONTROL 6F/7F (Vascular Products) IMPLANT
CNTNR URN SCR LID CUP LEK RST (MISCELLANEOUS) ×4 IMPLANT
CONT SPEC 4OZ STRL OR WHT (MISCELLANEOUS) ×4
COVER BACK TABLE 80X110 HD (DRAPES) IMPLANT
DERMABOND ADVANCED .7 DNX12 (GAUZE/BANDAGES/DRESSINGS) ×2 IMPLANT
DEVICE CLOSURE PERCLS PRGLD 6F (VASCULAR PRODUCTS) ×4 IMPLANT
DRSG TEGADERM 4X10 (GAUZE/BANDAGES/DRESSINGS) IMPLANT
DRSG TEGADERM 4X4.75 (GAUZE/BANDAGES/DRESSINGS) ×4 IMPLANT
ELECT REM PT RETURN 9FT ADLT (ELECTROSURGICAL) ×2
ELECTRODE REM PT RTRN 9FT ADLT (ELECTROSURGICAL) ×2 IMPLANT
GAUZE SPONGE 4X4 12PLY STRL (GAUZE/BANDAGES/DRESSINGS) ×2 IMPLANT
GLOVE BIO SURGEON STRL SZ7.5 (GLOVE) ×2 IMPLANT
GLOVE BIO SURGEON STRL SZ8 (GLOVE) ×2 IMPLANT
GLOVE ECLIPSE 7.0 STRL STRAW (GLOVE) ×2 IMPLANT
GLOVE SURG SS PI 6.5 STRL IVOR (GLOVE) IMPLANT
GOWN STRL REUS W/ TWL LRG LVL3 (GOWN DISPOSABLE) IMPLANT
GOWN STRL REUS W/ TWL XL LVL3 (GOWN DISPOSABLE) ×2 IMPLANT
GOWN STRL REUS W/TWL LRG LVL3 (GOWN DISPOSABLE)
GOWN STRL REUS W/TWL XL LVL3 (GOWN DISPOSABLE) ×2
GUIDEWIRE SAF TJ AMPL .035X180 (WIRE) ×2 IMPLANT
GUIDEWIRE SAFE TJ AMPLATZ EXST (WIRE) ×2 IMPLANT
KIT BASIN OR (CUSTOM PROCEDURE TRAY) ×2 IMPLANT
KIT HEART LEFT (KITS) ×2 IMPLANT
KIT SAPIAN 3 ULTRA RESILIA 26 (Valve) IMPLANT
KIT TURNOVER KIT B (KITS) ×2 IMPLANT
NS IRRIG 1000ML POUR BTL (IV SOLUTION) ×2 IMPLANT
PACK ENDO MINOR (CUSTOM PROCEDURE TRAY) ×2 IMPLANT
PAD ARMBOARD 7.5X6 YLW CONV (MISCELLANEOUS) ×4 IMPLANT
PAD ELECT DEFIB RADIOL ZOLL (MISCELLANEOUS) ×2 IMPLANT
PERCLOSE PROGLIDE 6F (VASCULAR PRODUCTS) ×4
POSITIONER HEAD DONUT 9IN (MISCELLANEOUS) ×2 IMPLANT
SET MICROPUNCTURE 5F STIFF (MISCELLANEOUS) ×2 IMPLANT
SHEATH BRITE TIP 7FR 35CM (SHEATH) ×2 IMPLANT
SHEATH PINNACLE 6F 10CM (SHEATH) ×2 IMPLANT
SHEATH PINNACLE 8F 10CM (SHEATH) ×2 IMPLANT
SHIELD RADPAD ABSORB 11X34 (MISCELLANEOUS) IMPLANT
SLEEVE REPOSITIONING LENGTH 30 (MISCELLANEOUS) ×2 IMPLANT
SPIKE FLUID TRANSFER (MISCELLANEOUS) ×2 IMPLANT
SPONGE T-LAP 18X18 ~~LOC~~+RFID (SPONGE) ×2 IMPLANT
STOPCOCK MORSE 400PSI 3WAY (MISCELLANEOUS) ×4 IMPLANT
SUT SILK  1 MH (SUTURE) ×2
SUT SILK 1 MH (SUTURE) ×2 IMPLANT
SYR 50ML LL SCALE MARK (SYRINGE) ×2 IMPLANT
SYR BULB IRRIG 60ML STRL (SYRINGE) IMPLANT
SYR MEDRAD MARK V 150ML (SYRINGE) IMPLANT
TOWEL GREEN STERILE (TOWEL DISPOSABLE) ×4 IMPLANT
TRANSDUCER W/STOPCOCK (MISCELLANEOUS) ×4 IMPLANT
TRAY FOLEY SLVR 14FR TEMP STAT (SET/KITS/TRAYS/PACK) IMPLANT
TUBE SUCT INTRACARD DLP 20F (MISCELLANEOUS) IMPLANT
TUBING ART PRESS 72  MALE/FEM (TUBING) ×2
TUBING ART PRESS 72 MALE/FEM (TUBING) IMPLANT
TUBING CIL FLEX 10 FLL-RA (TUBING) IMPLANT
WIRE AMPLATZ SS-J .035X180CM (WIRE) IMPLANT
WIRE EMERALD 3MM-J .035X150CM (WIRE) ×2 IMPLANT
WIRE EMERALD 3MM-J .035X260CM (WIRE) ×2 IMPLANT
WIRE EMERALD ST .035X260CM (WIRE) ×4 IMPLANT
WIRE SAFARI SM CURVE 275 (WIRE) ×2 IMPLANT

## 2022-08-19 NOTE — Anesthesia Preprocedure Evaluation (Signed)
Anesthesia Evaluation  Patient identified by MRN, date of birth, ID band Patient awake    Reviewed: Allergy & Precautions, H&P , NPO status , Patient's Chart, lab work & pertinent test results  Airway Mallampati: II  TM Distance: >3 FB Neck ROM: Full    Dental no notable dental hx.    Pulmonary neg pulmonary ROS, former smoker   Pulmonary exam normal breath sounds clear to auscultation       Cardiovascular + CAD, + Past MI, + Cardiac Stents and +CHF  + Valvular Problems/Murmurs AS  Rhythm:Regular Rate:Normal + Systolic murmurs  Left Ventricle: Left ventricular ejection fraction, by estimation, is 40  to 45%. The left ventricle has mildly decreased function. The left  ventricle demonstrates global hypokinesis. The left ventricular internal  cavity size was normal in size. There is   no left ventricular hypertrophy. Left ventricular diastolic parameters  are consistent with Grade III diastolic dysfunction (restrictive).  Elevated left ventricular end-diastolic pressure.   Right Ventricle: The right ventricular size is normal. No increase in  right ventricular wall thickness. Right ventricular systolic function is  normal. There is mildly elevated pulmonary artery systolic pressure. The  tricuspid regurgitant velocity is 2.36   m/s, and with an assumed right atrial pressure of 15 mmHg, the estimated  right ventricular systolic pressure is 40.1 mmHg.   Left Atrium: Left atrial size was mildly dilated.   Right Atrium: Right atrial size was normal in size.   Pericardium: There is no evidence of pericardial effusion.   Mitral Valve: The mitral valve is degenerative in appearance. There is  moderate thickening of the mitral valve leaflet(s). There is mild  calcification of the mitral valve leaflet(s). Mild to moderate mitral  annular calcification. Mild mitral valve  regurgitation. No evidence of mitral valve stenosis. MV peak  gradient, 9.5  mmHg. The mean mitral valve gradient is 3.0 mmHg.   Tricuspid Valve: The tricuspid valve is normal in structure. Tricuspid  valve regurgitation is not demonstrated. No evidence of tricuspid  stenosis.   Aortic Valve: The aortic valve has an indeterminant number of cusps. There  is severe calcifcation of the aortic valve. There is severe thickening of  the aortic valve. Aortic valve regurgitation is not visualized. Severe  aortic stenosis is present. Aortic   valve mean gradient measures 45.0 mmHg. Aortic valve peak gradient  measures 67.2 mmHg. Aortic valve area, by VTI measures 0.53 cm     Neuro/Psych negative neurological ROS  negative psych ROS   GI/Hepatic negative GI ROS, Neg liver ROS,,,  Endo/Other  negative endocrine ROS    Renal/GU negative Renal ROS  negative genitourinary   Musculoskeletal negative musculoskeletal ROS (+)    Abdominal   Peds negative pediatric ROS (+)  Hematology negative hematology ROS (+)   Anesthesia Other Findings   Reproductive/Obstetrics negative OB ROS                             Anesthesia Physical Anesthesia Plan  ASA: 4  Anesthesia Plan: MAC   Post-op Pain Management: Minimal or no pain anticipated   Induction: Intravenous  PONV Risk Score and Plan: 1 and Ondansetron  Airway Management Planned: Simple Face Mask  Additional Equipment: Arterial line  Intra-op Plan:   Post-operative Plan:   Informed Consent: I have reviewed the patients History and Physical, chart, labs and discussed the procedure including the risks, benefits and alternatives for the proposed anesthesia with the  patient or authorized representative who has indicated his/her understanding and acceptance.     Dental advisory given  Plan Discussed with: CRNA and Surgeon  Anesthesia Plan Comments:        Anesthesia Quick Evaluation

## 2022-08-19 NOTE — Interval H&P Note (Signed)
History and Physical Interval Note:  08/19/2022 12:57 PM  Jonathan Romero  has presented today for surgery, with the diagnosis of Severe Aortic Stenosis.  The various methods of treatment have been discussed with the patient and family. After consideration of risks, benefits and other options for treatment, the patient has consented to  Procedure(s): Transcatheter Aortic Valve Replacement, Transfemoral (N/A) INTRAOPERATIVE TRANSTHORACIC ECHOCARDIOGRAM (N/A) as a surgical intervention.  The patient's history has been reviewed, patient examined, no change in status, stable for surgery.  I have reviewed the patient's chart and labs.  Questions were answered to the patient's satisfaction.     Eugenio Hoes

## 2022-08-19 NOTE — Progress Notes (Signed)
   08/19/22 1914  Vitals  BP 117/63  MAP (mmHg) 79  BP Location Right Arm  BP Method Automatic  Patient Position (if appropriate) Lying  Pulse Rate (!) 58  Pulse Rate Source Monitor  ECG Heart Rate 60  Resp 12  Level of Consciousness  Level of Consciousness Alert  MEWS COLOR  MEWS Score Color Green  Oxygen Therapy  SpO2 95 %  O2 Device Room Air  Pain Assessment  Pain Scale 0-10  Pain Score 0  Glasgow Coma Scale  Eye Opening 4  Best Verbal Response (NON-intubated) 5  Best Motor Response 6  Glasgow Coma Scale Score 15  MEWS Score  MEWS Temp 0  MEWS Systolic 0  MEWS Pulse 0  MEWS RR 1  MEWS LOC 0  MEWS Score 1   Pt admitted 4E05 Bilat Groin Sites Level 0 VSS Call bell within reach Bed Rest Until 20:15

## 2022-08-19 NOTE — Discharge Summary (Signed)
[place] VALVE TEAM  Discharge Summary    Patient ID: Jonathan Romero MRN: [id number]; DOB: [birth year]  Admit date: 08/19/2022 Discharge date: 08/20/2022  Primary Care Provider: Sharilyn Sites, MD  Primary Cardiologist: Peter Martinique, MD / Dr. Burt Knack and Dr. Lavonna Monarch TAVR  Discharge Diagnoses    Principal Problem:   S/P TAVR (transcatheter aortic valve replacement) Active Problems:   Hyperlipidemia   Aortic stenosis, severe   NSTEMI (non-ST elevated myocardial infarction) [facility])   Syncope   SVT (supraventricular tachycardia)   Ischemic cardiomyopathy   Allergies Allergies  Allergen Reactions   Statins     Diagnostic Studies/Procedures     TAVR OPERATIVE NOTE     Date of Procedure:                08/19/2022   Preoperative Diagnosis:      Severe Aortic Stenosis    Postoperative Diagnosis:    Same    Procedure:        Transcatheter Aortic Valve Replacement - Percutaneous  Transfemoral Approach             Edwards Sapien 3 Ultra Resilia THV (size 26 mm, serial [id number])              Co-Surgeons:                        Coralie Common, MD and Sherren Mocha, MD   Anesthesiologist:                  Myrtie Soman, MD   Echocardiographer:              Sanda Klein, MD   Pre-operative Echo Findings: Severe aortic stenosis Normal left ventricular systolic function   Post-operative Echo Findings: No paravalvular leak Normal/unchanged left ventricular systolic function  ___________________________   Echo 08/20/22: completed but pending formal read at the time of discharge   History of Present Illness     Jonathan Romero is a 76 y.o. male with a history of HLD who was recently admitted for syncope and diagnosed with SVT, CAD requiring LAD PCI/DES x3 and severe AS who presented [facility] on 08/19/22 for planned TAVR.    Pt has no significant past medical history by virtue of not routinely seeing a regular medical doctor.   On  08/09/22 he had two episodes of exertional syncope episode. His wife called 37 and EMS found the patient in SVT with a reported HR of 280. He was then cardioverted into NSR. Upon arrival to the ED he was found to have a heart murmur, positive troponin, NSTEMI. Echo 08/10/22 revealed EF 40-45% with severe AS with a mean gradient of 45.0 mmHg, peak gradient of 67.28mmHg, and AVA 0.53 cm2. R/LHC was 08/11/22 showed a 95% calcified LAD lesion. He was  evaluated by the multidisciplinary valve team and felt to not be a surgical candidate due to significant aortic calcification found on CTA. He ultimately underwent successful PCI with orbital atherectomy, PTCA, and placement of 3 overlapping drug-eluting stents from the proximal to mid LAD. He was discharged home on 08/14/22 with plans to return on 08/19/22 for TAVR.  Hospital Course     Consultants: none   Severe AS: s/p successful TAVR with a 26 mm Edwards Sapien 3 Ultra Resilia  THV via the TF approach on 08/19/22. Post operative echo completed but pending formal read. Groin sites are stable. ECG with LVH/repol abnormalities, similar  to previous tracings. There is no high grade heart block. Continued on DAPT Asprin and Plavix given recent PCI.   HLD: continue ezetimibe 10mg  daily. LDL 160. Recent ACS. He is statin intolerant (has been tried on at least 3 by previous PCP). May want to refer to lipid clinic to consider PCSK9i in the outpatient setting.   CAD: underwent successful PCI with orbital atherectomy, PTCA, and placement of 3 overlapping drug-eluting stents from the proximal to mid LAD on 08/13/22. Continue DAPT with aspirin/plavix BB. AS above, statin intolerant.   SVT: stable, no SVT episodes since EMS cardioversion last admission. Continue metoprolol 25mg  daily.  Prostatomegaly: pre TAVR CT reported prostatomegaly with focal area of focal area of hyperenhancement of the left prostate, recommend correlation with PSA. PSA ordered and normal. Will  defer to PCP.   _____________  Discharge Vitals Blood pressure 134/72, pulse 68, temperature 98.8 F (37.1 C), temperature source Oral, resp. rate 13, height 5\' 10"  (1.778 m), weight 71.4 kg, SpO2 97 %.  Filed Weights   08/19/22 1152 08/20/22 0523  Weight: 71.2 kg 71.4 kg     GEN: Well nourished, well developed, in no acute distress HEENT: normal Neck: no JVD or masses Cardiac: RRR; soft flow murmur. rubs, or gallops,no edema  Respiratory:  clear to auscultation bilaterally, normal work of breathing GI: soft, nontender, nondistended, + BS MS: no deformity or atrophy Skin: warm and dry, no rash.  Groin sites clear without hematoma or ecchymosis  Neuro:  Alert and Oriented x 3, Strength and sensation are intact Psych: euthymic mood, full affect    Labs & Radiologic Studies    CBC Recent Labs    08/18/22 0832 08/19/22 1512 08/19/22 1700 08/20/22 0215  WBC 8.5  --   --  7.1  HGB 13.6   < > 11.6* 12.0*  HCT 41.0   < > 34.0* 35.2*  MCV 102.0*  --   --  101.1*  PLT 257  --   --  219   < > = values in this interval not displayed.   Basic Metabolic Panel Recent Labs    08/18/22 0832 08/19/22 1512 08/19/22 1700 08/20/22 0215  NA 138   < > 140 137  K 4.2   < > 4.2 3.9  CL 105   < > 104 106  CO2 27  --   --  25  GLUCOSE 104*   < > 107* 134*  BUN 15   < > 15 17  CREATININE 0.96   < > 0.70 0.91  CALCIUM 8.8*  --   --  8.4*  MG  --   --   --  2.2   < > = values in this interval not displayed.   Liver Function Tests Recent Labs    08/18/22 0832  AST 18  ALT 15  ALKPHOS 82  BILITOT 1.1  PROT 6.8  ALBUMIN 3.1*   No results for input(s): "LIPASE", "AMYLASE" in the last 72 hours. Cardiac Enzymes No results for input(s): "CKTOTAL", "CKMB", "CKMBINDEX", "TROPONINI" in the last 72 hours. BNP Invalid input(s): "POCBNP" D-Dimer No results for input(s): "DDIMER" in the last 72 hours. Hemoglobin A1C No results for input(s): "HGBA1C" in the last 72 hours. Fasting  Lipid Panel No results for input(s): "CHOL", "HDL", "LDLCALC", "TRIG", "CHOLHDL", "LDLDIRECT" in the last 72 hours. Thyroid Function Tests No results for input(s): "TSH", "T4TOTAL", "T3FREE", "THYROIDAB" in the last 72 hours.  Invalid input(s): "FREET3" _____________  ECHOCARDIOGRAM LIMITED  Result Date:  08/19/2022    ECHOCARDIOGRAM LIMITED REPORT   Patient Name:   ROY LUNDSTROM Date of Exam: 08/19/2022 Medical Rec #:  [id number]      Height:       70.0 in Accession #:    [id number]     Weight:       157.2 lb Date of Birth:  [birth year]      BSA:          1.884 m Patient Age:    81 years       BP:           136/80 mmHg Patient Gender: M              HR:           72 bpm. Exam Location:  Inpatient Procedure: Limited Echo, Color Doppler and Cardiac Doppler Indications:     Aortic Stenosis i35.0  History:         Patient has prior history of Echocardiogram examinations, most                  recent 08/10/2022. CAD; Risk Factors:Dyslipidemia.                  Aortic Valve: valve is present in the aortic position.  Sonographer:     Raquel Sarna Senior RDCS Referring Phys:  [id number] Eileen Stanford Diagnosing Phys: Sanda Klein MD  Sonographer Comments: 36mm Edwards S3U TAVR Implanted  PREPROCEDURE FINDINGS Mildly reduced left ventricular systolic function. Estimated LVEF 45%. Abnormal left ventricular regional wall motion. There is global hypokinesis, disproportionately severe in the basal inferolateral wall. Trileaflet aortic valve with severe calcific stenosis. Estimated peak gradient 72 mm Hg, mean gradient 47 mm Hg, dimensionless index 0.21, aortic valve area 0.47 cm2 (indexed for BSA 0.25 cm2/m2) There is no aortic insufficiency. There is no mitral insufficiency. There is no pericardial effusion. POSTPROCEDURE FINDINGS Mildly reduced left ventricular systolic function. Unchanged estimated LVEF 45%. Unchanged left ventricular regional wall motion. Well-seated stent valve (TAVR). Estimated peak gradient 8 mm Hg,  mean gradient 4 mm Hg, dimensionless index 0.56, aortic valve area 1.51 cm2 (indexed for BSA 0.80 cm2/m2), acceleration time 74 ms. There is no aortic insufficiency or perivalvular leak. There is no mitral insufficiency. There is no pericardial effusion.  IMPRESSIONS  1. Left ventricular ejection fraction, by estimation, is 45 to 50%. The left ventricle has mildly decreased function. The left ventricle demonstrates global hypokinesis. There is moderate hypokinesis of the left ventricular, basal inferior wall and inferolateral wall.  2. Left atrial size was moderately dilated.  3. No evidence of mitral valve regurgitation.  4. The aortic valve has been repaired/replaced. Aortic valve regurgitation is mild. There is a valve present in the aortic position. Aortic valve mean gradient measures 4.0 mmHg. Aortic valve Vmax measures 1.43 m/s. Aortic valve acceleration time measures 74 msec. FINDINGS  Left Ventricle: Left ventricular ejection fraction, by estimation, is 45 to 50%. The left ventricle has mildly decreased function. The left ventricle demonstrates global hypokinesis. Moderate hypokinesis of the left ventricular, basal inferior wall and inferolateral wall. Left Atrium: Left atrial size was moderately dilated. Pericardium: There is no evidence of pericardial effusion. Mitral Valve: Mild to moderate mitral annular calcification. Aortic Valve: The aortic valve has been repaired/replaced. Aortic valve regurgitation is mild. Aortic valve mean gradient measures 4.0 mmHg. Aortic valve peak gradient measures 8.2 mmHg. Aortic valve area, by VTI measures 1.51 cm. There is a valve present in the aortic position.  Additional Comments: Spectral Doppler performed. Color Doppler performed.  LEFT VENTRICLE PLAX 2D LVOT diam:     1.90 cm LV SV:         44 LV SV Index:   23 LVOT Area:     2.84 cm  AORTIC VALVE AV Area (Vmax):    1.59 cm AV Area (Vmean):   1.86 cm AV Area (VTI):     1.51 cm AV Vmax:           143.00 cm/s AV  Vmean:          93.700 cm/s AV VTI:            0.291 m AV Peak Grad:      8.2 mmHg AV Mean Grad:      4.0 mmHg LVOT Vmax:         80.30 cm/s LVOT Vmean:        61.500 cm/s LVOT VTI:          0.155 m LVOT/AV VTI ratio: 0.53  SHUNTS Systemic VTI:  0.16 m Systemic Diam: 1.90 cm Thurmon Fair MD Electronically signed by Thurmon Fair MD Signature Date/Time: 08/19/2022/5:42:27 PM    Final    Structural Heart Procedure  Result Date: 08/19/2022 See surgical note for result.  DG Chest 2 View  Result Date: 08/18/2022 CLINICAL DATA:  Pre-admit for heart surgery 08/19/2022. Preoperative evaluation. EXAM: CHEST - 2 VIEW COMPARISON:  AP chest 08/09/2022, chest two views 11/25/2018 FINDINGS: Cardiac silhouette and mediastinal contours are within normal limits. Mild-to-moderate calcification is again seen within the aortic arch. Mild interstitial thickening within the right-greater-than-left upper lungs is unchanged from 08/09/2022 but mildly increased from 11/25/2018. No pleural effusion pneumothorax. Moderate multilevel disc space narrowing and bridging osteophytes of the thoracic spine. IMPRESSION: Mild interstitial thickening within the right-greater-than-left upper lungs is unchanged from 08/09/2022 but mildly increased from 11/25/2018. This may represent chronic scarring/monitors are lung disease. An acute process is felt less likely. Electronically Signed   By: Neita Garnet M.D.   On: 08/18/2022 09:34   CARDIAC CATHETERIZATION  Result Date: 08/13/2022   Mid LAD lesion is 70% stenosed.   Prox LAD to Mid LAD lesion is 95% stenosed.   Prox RCA lesion is 75% stenosed.   1st Sept lesion is 70% stenosed.   A drug-eluting stent was successfully placed using a SYNERGY XD 3.50X16.   A drug-eluting stent was successfully placed using a SYNERGY XD 2.75X12.   Post intervention, there is a 0% residual stenosis.   Post intervention, there is a 0% residual stenosis. Successful PCI with orbital atherectomy, PTCA, and  placement of 3 overlapping drug-eluting stents from the proximal to mid LAD guided by OCT imaging (3.5 x 16 mm Synergy DES proximally, 3.0 x 20 mm Synergy DES mid vessel, 2.75 x 12 mm Synergy DES distally) Recommendations: Aspirin and clopidogrel without interruption x12 months.  Plan to proceed with TAVR next week.   CT CORONARY MORPH W/CTA COR W/SCORE W/CA W/CM &/OR WO/CM  Addendum Date: 08/12/2022   ADDENDUM REPORT: 08/12/2022 15:24 CLINICAL DATA:  Aortic Valve pathology with assessment for TAVR EXAM: Cardiac TAVR CT TECHNIQUE: The patient was scanned on a Siemens Force 192 slice scanner. A 120 kV retrospective scan was triggered in the descending thoracic aorta at 111 HU's. Gantry rotation speed was 270 msecs and collimation was .9 mm. No beta blockade or nitro were given. The 3D data set was reconstructed in 5% intervals of the R-R cycle. Systolic and diastolic phases  were analyzed on a dedicated work station using MPR, MIP and VRT modes. The patient received 95 cc of contrast. FINDINGS: Aortic Valve: Severely thickened tri-leaflet aortic valve with heavy calcification and reduced excursion the planimeter valve area is 0.924 Sq cm consistent with severe aortic stenosis Number of leaflets: 3 LVOT calcification: Presence of LVOT calcification into the intervalvular fibrosa Annular calcification: Severe due to presence of LVOT calcification, moderate otherwise Aortic Valve Calcium Score: 2749 Presence of basal septal hypertrophy: Non severe Perimembranous septal diameter: 7 mm Mitral Valve: No significant mitral annular calcification Aortic Annulus Measurements- 30% phase Major annulus diameter: 29 mm Minor annulus diameter: 21 mm Annular perimeter: 79 mm Annular area: 4.67 cm2 Aortic Root Measurements- 70% phase Sinotubular Junction: 30 mm Ascending Thoracic Aorta: 36 mm Aortic Arch: 28 mm Descending Thoracic Aorta: 25 mm Aortic atherosclerosis. Sinus of Valsalva Measurements: Right coronary cusp width: 29  mm Left coronary cusp width: 29 mm Non coronary cusp width: 28 mm Coronary Artery Height above Annulus: Left Main: 13 mm Left SoV height: 20 mm Right Coronary: 14 mm Right SoV height: 20 mm Optimum Fluoroscopic Angle for Delivery: LAO 5, CAU 8 Cusp overlay view angle: RAO 0 CAU 11 Valves for structural team consideration: 26 mm Sapien Valve 29 mm Evolut Non TAVR Valve Findings: Coronary Arteries: Normal coronary origin. Study not completed with nitroglycerin. Coronary Calcium Score: Left main: 504 Left anterior descending artery: 951 Left circumflex artery: 237 Right coronary artery: 34 Total: 1726 Percentile: 83rd for age, sex, and race matched control. Systemic veins: Normal anatomy Main Pulmonary artery: Normal caliber Pulmonary veins: Normal anatomy Left atrial appendage: Patent Interatrial septum: No communications Left ventricle: Normal size Left atrium: Mild dilation Right ventricle: Normal size Right atrium: Mild dilation Pericardium: Trivial anterior pericardial effusion Extra Cardiac Findings as per separate reporting. IMPRESSION: 1. Severe Aortic stenosis. Findings pertinent to TAVR procedure are detailed above. RECOMMENDATIONS: The proposed cut-off value of 1,651 AU yielded a 93 % sensitivity and 75 % specificity in grading AS severity in patients with classical low-flow, low-gradient AS. Proposed different cut-off values to define severe AS for men and women as 2,065 AU and 1,274 AU, respectively. The joint European and American recommendations for the assessment of AS consider the aortic valve calcium score as a continuum - a very high calcium score suggests severe AS and a low calcium score suggests severe AS is unlikely. Kerman Passey, et al. 2017 ESC/EACTS Guidelines for the management of valvular heart disease. Eur Heart J [id number] Coronary artery calcium (CAC) score is a strong predictor of incident coronary heart disease (CHD) and provides predictive information beyond  traditional risk factors. CAC scoring is reasonable to use in the decision to withhold, postpone, or initiate statin therapy in intermediate-risk or selected borderline-risk asymptomatic adults (age 25-75 years and LDL-C >=70 to <190 mg/dL) who do not have diabetes or established atherosclerotic cardiovascular disease (ASCVD).* In intermediate-risk (10-year ASCVD risk >=7.5% to <20%) adults or selected borderline-risk (10-year ASCVD risk >=5% to <7.5%) adults in whom a CAC score is measured for the purpose of making a treatment decision the following recommendations have been made: If CAC = 0, it is reasonable to withhold statin therapy and reassess in 5 to 10 years, as long as higher risk conditions are absent (diabetes mellitus, family history of premature CHD in first degree relatives (males <55 years; females <65 years), cigarette smoking, LDL >=190 mg/dL or other independent risk factors). If CAC is 1 to 99, it  is reasonable to initiate statin therapy for patients >=59 years of age. If CAC is >=100 or >=75th percentile, it is reasonable to initiate statin therapy at any age. Cardiology referral should be considered for patients with CAC scores >=400 or >=75th percentile. *2018 AHA/ACC/AACVPR/AAPA/ABC/ACPM/ADA/AGS/APhA/ASPC/NLA/PCNA Guideline on the Management of Blood Cholesterol: A Report of the American College of Cardiology/American Heart Association Task Force on Clinical Practice Guidelines. J Am Coll Cardiol. 2019;73(24):3168-3209. Mahesh  Chandrasekhar Electronically Signed   By: Rudean Haskell M.D.   On: 08/12/2022 15:24   Result Date: 08/12/2022 EXAM: OVER-READ INTERPRETATION  CT CHEST The following report is a limited chest CT over-read performed by radiologist Dr. Yetta Glassman of [facility], [place] on 08/12/2022. This over-read does not include interpretation of cardiac or coronary anatomy or pathology. The cardiac TAVR interpretation by the cardiologist is attached. COMPARISON:   None Available. FINDINGS: Extracardiac findings will be described separately under dictation for contemporaneously obtained CTA chest, abdomen and pelvis. IMPRESSION: Please see separate dictation for contemporaneously obtained CTA chest, abdomen and pelvis dated 08/12/2022 for full description of relevant extracardiac findings. Electronically Signed: By: Yetta Glassman M.D. On: 08/12/2022 14:00   CT Angio Abd/Pel w/ and/or w/o  Result Date: 08/12/2022 CLINICAL DATA:  Aortic valve replacement preop evaluation EXAM: CT ANGIOGRAPHY CHEST, ABDOMEN AND PELVIS TECHNIQUE: Non-contrast CT of the chest was initially obtained. Multidetector CT imaging through the chest, abdomen and pelvis was performed using the standard protocol during bolus administration of intravenous contrast. Multiplanar reconstructed images and MIPs were obtained and reviewed to evaluate the vascular anatomy. RADIATION DOSE REDUCTION: This exam was performed according to the departmental dose-optimization program which includes automated exposure control, adjustment of the mA and/or kV according to patient size and/or use of iterative reconstruction technique. CONTRAST:  89mL OMNIPAQUE IOHEXOL 350 MG/ML SOLN COMPARISON:  Chest CT dated August 11, 2022 FINDINGS: CTA CHEST FINDINGS Cardiovascular: Normal heart size. Aortic valve thickening and calcifications. No pericardial effusion. Normal caliber thoracic aorta with severe atherosclerotic disease. Severe three-vessel coronary artery calcifications Mediastinum/Nodes: Esophagus and thyroid are unremarkable. No pathologically enlarged lymph nodes seen in the chest. Calcified mediastinal and right hilar lymph nodes, likely sequela of prior granulomatous infection. Lungs/Pleura: Central airways are patent. Bilateral ground-glass opacities which are most pronounced in the upper lobes. Mild septal thickening. Small bilateral pleural effusions and bibasilar atelectasis. Musculoskeletal: No chest wall  abnormality. No acute or significant osseous findings. CTA ABDOMEN AND PELVIS FINDINGS Hepatobiliary: No focal liver abnormality is seen. No gallstones, gallbladder wall thickening, or biliary dilatation. Pancreas: Unremarkable. No pancreatic ductal dilatation or surrounding inflammatory changes. Spleen: Normal in size without focal abnormality. Adrenals/Urinary Tract: Bilateral adrenal glands are unremarkable. No hydronephrosis or nephrolithiasis. Excreted contrast material is seen in the bladder. Stomach/Bowel: Stomach is within normal limits. Appendix appears normal. No evidence of bowel wall thickening, distention, or inflammatory changes. Vascular/lymphatic: Normal caliber abdominal aorta with severe calcified and noncalcified plaque. No pathologically enlarged lymph nodes seen in the abdomen or pelvis. Reproductive: Prostatomegaly with focal area of hyperenhancement of the left prostate measuring 13 x 8 mm on series 4, image 27. Other: No abdominal wall hernia or abnormality. No abdominopelvic ascites. Musculoskeletal: No acute or significant osseous findings. VASCULAR MEASUREMENTS PERTINENT TO TAVR: AORTA: Minimal Aortic Diameter-12.3 mm Severity of Aortic Calcification-severe RIGHT PELVIS: Right Common Iliac Artery - Minimal Diameter-9.8 mm Tortuosity-mild Calcification-moderate Right External Iliac Artery - Minimal Diameter-9.0 mm Tortuosity-moderate Calcification-mild Right Common Femoral Artery - Minimal Diameter-7.2 mm Tortuosity-none Calcification-mild LEFT PELVIS: Left Common  Iliac Artery - Minimal Diameter-7.3 mm Tortuosity-mild Calcification-moderate Left External Iliac Artery - Minimal Diameter-9.3 mm Tortuosity-moderate Calcification-mild Left Common Femoral Artery - Minimal Diameter-7.2 mm Tortuosity-none Calcification-moderate Review of the MIP images confirms the above findings. IMPRESSION: 1. Vascular findings and measurements pertinent to potential TAVR procedure, as detailed above. 2.  Thickening and calcification of the aortic valve, compatible with reported clinical history of aortic stenosis. 3. 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 4. Bilateral ground-glass opacities which are most pronounced in the upper lobes, likely due to pulmonary edema. 5. Small bilateral pleural effusions and bibasilar atelectasis. 6. Prostatomegaly with focal area of hyperenhancement of the left prostate, recommend correlation with PSA. Electronically Signed   By: Yetta Glassman M.D.   On: 08/12/2022 14:44   CT ANGIO CHEST AORTA W/CM & OR WO/CM  Result Date: 08/12/2022 CLINICAL DATA:  Aortic valve replacement preop evaluation EXAM: CT ANGIOGRAPHY CHEST, ABDOMEN AND PELVIS TECHNIQUE: Non-contrast CT of the chest was initially obtained. Multidetector CT imaging through the chest, abdomen and pelvis was performed using the standard protocol during bolus administration of intravenous contrast. Multiplanar reconstructed images and MIPs were obtained and reviewed to evaluate the vascular anatomy. RADIATION DOSE REDUCTION: This exam was performed according to the departmental dose-optimization program which includes automated exposure control, adjustment of the mA and/or kV according to patient size and/or use of iterative reconstruction technique. CONTRAST:  30mL OMNIPAQUE IOHEXOL 350 MG/ML SOLN COMPARISON:  Chest CT dated August 11, 2022 FINDINGS: CTA CHEST FINDINGS Cardiovascular: Normal heart size. Aortic valve thickening and calcifications. No pericardial effusion. Normal caliber thoracic aorta with severe atherosclerotic disease. Severe three-vessel coronary artery calcifications Mediastinum/Nodes: Esophagus and thyroid are unremarkable. No pathologically enlarged lymph nodes seen in the chest. Calcified mediastinal and right hilar lymph nodes, likely sequela of prior granulomatous infection. Lungs/Pleura: Central airways are patent. Bilateral ground-glass opacities  which are most pronounced in the upper lobes. Mild septal thickening. Small bilateral pleural effusions and bibasilar atelectasis. Musculoskeletal: No chest wall abnormality. No acute or significant osseous findings. CTA ABDOMEN AND PELVIS FINDINGS Hepatobiliary: No focal liver abnormality is seen. No gallstones, gallbladder wall thickening, or biliary dilatation. Pancreas: Unremarkable. No pancreatic ductal dilatation or surrounding inflammatory changes. Spleen: Normal in size without focal abnormality. Adrenals/Urinary Tract: Bilateral adrenal glands are unremarkable. No hydronephrosis or nephrolithiasis. Excreted contrast material is seen in the bladder. Stomach/Bowel: Stomach is within normal limits. Appendix appears normal. No evidence of bowel wall thickening, distention, or inflammatory changes. Vascular/lymphatic: Normal caliber abdominal aorta with severe calcified and noncalcified plaque. No pathologically enlarged lymph nodes seen in the abdomen or pelvis. Reproductive: Prostatomegaly with focal area of hyperenhancement of the left prostate measuring 13 x 8 mm on series 4, image 27. Other: No abdominal wall hernia or abnormality. No abdominopelvic ascites. Musculoskeletal: No acute or significant osseous findings. VASCULAR MEASUREMENTS PERTINENT TO TAVR: AORTA: Minimal Aortic Diameter-12.3 mm Severity of Aortic Calcification-severe RIGHT PELVIS: Right Common Iliac Artery - Minimal Diameter-9.8 mm Tortuosity-mild Calcification-moderate Right External Iliac Artery - Minimal Diameter-9.0 mm Tortuosity-moderate Calcification-mild Right Common Femoral Artery - Minimal Diameter-7.2 mm Tortuosity-none Calcification-mild LEFT PELVIS: Left Common Iliac Artery - Minimal Diameter-7.3 mm Tortuosity-mild Calcification-moderate Left External Iliac Artery - Minimal Diameter-9.3 mm Tortuosity-moderate Calcification-mild Left Common Femoral Artery - Minimal Diameter-7.2 mm Tortuosity-none Calcification-moderate Review of  the MIP images confirms the above findings. IMPRESSION: 1. Vascular findings and measurements pertinent to potential TAVR procedure, as detailed above. 2. Thickening and calcification of the aortic valve, compatible with reported  clinical history of aortic stenosis. 3. 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 4. Bilateral ground-glass opacities which are most pronounced in the upper lobes, likely due to pulmonary edema. 5. Small bilateral pleural effusions and bibasilar atelectasis. 6. Prostatomegaly with focal area of hyperenhancement of the left prostate, recommend correlation with PSA. Electronically Signed   By: Yetta Glassman M.D.   On: 08/12/2022 14:44   CT CHEST WO CONTRAST  Result Date: 08/11/2022 CLINICAL DATA:  Aortic atherosclerosis, aortic stenosis, pre preop aortic assessment. EXAM: CT CHEST WITHOUT CONTRAST TECHNIQUE: Multidetector CT imaging of the chest was performed following the standard protocol without IV contrast. RADIATION DOSE REDUCTION: This exam was performed according to the departmental dose-optimization program which includes automated exposure control, adjustment of the mA and/or kV according to patient size and/or use of iterative reconstruction technique. COMPARISON:  None Available. FINDINGS: Cardiovascular: The heart is normal in size and there is no significant pericardial effusion. Three-vessel coronary artery calcifications are noted. There is atherosclerotic calcification of the aorta and aortic valve annulus. The ascending aorta measures 3.6 cm in diameter, the aortic arch measures 2.9 cm in the descending aorta measures 2.5 cm. The pulmonary trunk is normal in caliber. Mediastinum/Nodes: Calcified lymph nodes are noted in the mediastinum and right hilum. Few prominent lymph nodes are noted in the mediastinum measuring up to 1 cm in the subcarinal space. No axillary lymphadenopathy. Evaluation of the hilar regions is limited due  to lack of IV contrast. The thyroid gland, trachea, and esophagus are within normal limits. Lungs/Pleura: There are small bilateral pleural effusions. Interstitial prominence and patchy airspace disease is noted bilaterally. Subpleural reticulations are present bilaterally. No pneumothorax. Upper Abdomen: Renal calculi are noted bilaterally. No acute abnormality. Musculoskeletal: Degenerative changes are present in the thoracic spine. No acute osseous abnormality. IMPRESSION: 1. Aortic atherosclerosis without evidence of aneurysm. 2. Coronary artery calcifications and atherosclerotic calcification of the aortic annulus. 3. Interstitial and airspace opacities bilaterally, possible edema versus pneumonitis. 4. Small bilateral pleural effusions. Electronically Signed   By: Brett Fairy M.D.   On: 08/11/2022 21:11   VAS US DOPPLER PRE CABG  Result Date: 08/11/2022 PREOPERATIVE VASCULAR EVALUATION Patient Name:  WAYLEN LEVI  Date of Exam:   08/11/2022 Medical Rec #: [id number]       Accession #:    [id number] Date of Birth: Nov 09, 1945       Patient Gender: M Patient Age:   89 years Exam Location:  [facility] Procedure:      VAS US DOPPLER PRE CABG Referring Phys: Coralie Common --------------------------------------------------------------------------------  Indications:      Pre-CABG. Risk Factors:     Hyperlipidemia, coronary artery disease. Other Factors:    Severe aortic stenosis. Limitations:      Status post radial cath today. Bandages, IV Comparison Study: No prior study Performing Technologist: Sharion Dove RVS  Examination Guidelines: A complete evaluation includes B-mode imaging, spectral Doppler, color Doppler, and power Doppler as needed of all accessible portions of each vessel. Bilateral testing is considered an integral part of a complete examination. Limited examinations for reoccurring indications may be performed as noted.  Right Carotid Findings:  +----------+--------+--------+--------+------------+------------------+           PSV cm/sEDV cm/sStenosisDescribe    Comments           +----------+--------+--------+--------+------------+------------------+ CCA Prox  57      14  intimal thickening +----------+--------+--------+--------+------------+------------------+ CCA Distal65      12                          intimal thickening +----------+--------+--------+--------+------------+------------------+ ICA Prox  72      11              heterogenous                   +----------+--------+--------+--------+------------+------------------+ ICA Mid   53      18                                             +----------+--------+--------+--------+------------+------------------+ ICA Distal78      24                                             +----------+--------+--------+--------+------------+------------------+ ECA       81      11                                             +----------+--------+--------+--------+------------+------------------+ +----------+--------+-------+--------+------------+           PSV cm/sEDV cmsDescribeArm Pressure +----------+--------+-------+--------+------------+ Subclavian98                                  +----------+--------+-------+--------+------------+ +---------+--------+--+--------+--+ VertebralPSV cm/s52EDV cm/s14 +---------+--------+--+--------+--+ Left Carotid Findings: +----------+--------+--------+--------+------------+------------------+           PSV cm/sEDV cm/sStenosisDescribe    Comments           +----------+--------+--------+--------+------------+------------------+ CCA Prox  74      12                          intimal thickening +----------+--------+--------+--------+------------+------------------+ CCA Distal80      16                          intimal thickening  +----------+--------+--------+--------+------------+------------------+ ICA Prox  97      25              heterogenous                   +----------+--------+--------+--------+------------+------------------+ ICA Mid   87      26                                             +----------+--------+--------+--------+------------+------------------+ ICA Distal78      21                                             +----------+--------+--------+--------+------------+------------------+ ECA       106     10                                             +----------+--------+--------+--------+------------+------------------+  +----------+--------+--------+---------+------------+  SubclavianPSV cm/sEDV cm/sDescribe Arm Pressure +----------+--------+--------+---------+------------+           233             Turbulent             +----------+--------+--------+---------+------------+ +---------+--------+--+--------+--+ VertebralPSV cm/s55EDV cm/s15 +---------+--------+--+--------+--+  Right Doppler Findings: +------+--------+-----+---------+--------+ Site  PressureIndexDoppler  Comments +------+--------+-----+---------+--------+ Radial             triphasic         +------+--------+-----+---------+--------+ Ulnar              triphasic         +------+--------+-----+---------+--------+  Left Doppler Findings: +------+--------+-----+---------+--------+ Site  PressureIndexDoppler  Comments +------+--------+-----+---------+--------+ Radial             triphasic         +------+--------+-----+---------+--------+ Ulnar              triphasic         +------+--------+-----+---------+--------+  Technologist Notes: Unable to perform Allen's test secondary to radial cath done today.  Summary: Right Carotid: The extracranial vessels were near-normal with only minimal wall                thickening or plaque. Left Carotid: The extracranial vessels were near-normal  with only minimal wall               thickening or plaque. Vertebrals:  Bilateral vertebral arteries demonstrate antegrade flow. Subclavians: Left subclavian artery flow was disturbed. Normal flow hemodynamics              were seen in the right subclavian artery. Right Upper Extremity: Doppler waveforms decrease <50% with right radial compression. Doppler waveforms decrease >50% with right ulnar compression.  Electronically signed by Servando Snare MD on 08/11/2022 at 5:32:04 PM.    Final    CARDIAC CATHETERIZATION  Addendum Date: 08/11/2022   CONCLUSIONS: Severe calcific aortic stenosis with calculated aortic valve area less than 0.75 cm. 95% proximal to mid LAD followed by 70% further down the vessel.  The proximal lesion is within a calcified segment. 70 to 80% mid nondominant RCA which supplies faint collaterals to the LAD. Mild pulmonary hypertension.  Capillary wedge pressure 15 mmHg. RECOMMENDATIONS: Surgical aortic valve replacement and LIMA to LAD and possibly the acute marginal branch of the RCA.  Result Date: 08/11/2022   Mid LAD lesion is 70% stenosed.   Prox LAD to Mid LAD lesion is 95% stenosed.   1st Sept lesion is 70% stenosed.   Prox RCA lesion is 75% stenosed.   There is mild left ventricular systolic dysfunction.   LV end diastolic pressure is normal.   LV end diastolic pressure is normal.   The left ventricular ejection fraction is 45-50% by visual estimate.   Hemodynamic findings consistent with mild pulmonary hypertension and aortic valve stenosis.   There is severe aortic valve stenosis.   ECHOCARDIOGRAM COMPLETE  Result Date: 08/10/2022    ECHOCARDIOGRAM REPORT   Patient Name:   GEOVANIE UMBAUGH Date of Exam: 08/10/2022 Medical Rec #:  [id number]      Height:       70.0 in Accession #:    [id number]     Weight:       155.4 lb Date of Birth:  [birth year]      BSA:          1.875 m Patient Age:    83 years       BP:  155/87 mmHg Patient Gender: M              HR:           84  bpm. Exam Location:  Inpatient Procedure: 2D Echo, Color Doppler and Cardiac Doppler Indications:    NSTEMI i21.4  History:        Patient has prior history of Echocardiogram examinations, most                 recent 11/25/2018. Risk Factors:Dyslipidemia.  Sonographer:    Raquel Sarna Senior RDCS Referring Phys: [id number] [place]  1. Left ventricular ejection fraction, by estimation, is 40 to 45%. The left ventricle has mildly decreased function. The left ventricle demonstrates global hypokinesis. Left ventricular diastolic parameters are consistent with Grade III diastolic dysfunction (restrictive). Elevated left ventricular end-diastolic pressure.  2. Right ventricular systolic function is normal. The right ventricular size is normal. There is mildly elevated pulmonary artery systolic pressure. The estimated right ventricular systolic pressure is 123XX123 mmHg.  3. Left atrial size was mildly dilated.  4. The mitral valve is degenerative. Mild mitral valve regurgitation. No evidence of mitral stenosis. The mean mitral valve gradient is 3.0 mmHg.  5. The aortic valve has an indeterminant number of cusps. There is severe calcifcation of the aortic valve. There is severe thickening of the aortic valve. Aortic valve regurgitation is not visualized. Severe aortic valve stenosis. Aortic valve area, by  VTI measures 0.53 cm. Aortic valve mean gradient measures 45.0 mmHg. Aortic valve Vmax measures 4.10 m/s.  6. The inferior vena cava is dilated in size with <50% respiratory variability, suggesting right atrial pressure of 15 mmHg. FINDINGS  Left Ventricle: Left ventricular ejection fraction, by estimation, is 40 to 45%. The left ventricle has mildly decreased function. The left ventricle demonstrates global hypokinesis. The left ventricular internal cavity size was normal in size. There is  no left ventricular hypertrophy. Left ventricular diastolic parameters are consistent with Grade III diastolic dysfunction  (restrictive). Elevated left ventricular end-diastolic pressure. Right Ventricle: The right ventricular size is normal. No increase in right ventricular wall thickness. Right ventricular systolic function is normal. There is mildly elevated pulmonary artery systolic pressure. The tricuspid regurgitant velocity is 2.36  m/s, and with an assumed right atrial pressure of 15 mmHg, the estimated right ventricular systolic pressure is 123XX123 mmHg. Left Atrium: Left atrial size was mildly dilated. Right Atrium: Right atrial size was normal in size. Pericardium: There is no evidence of pericardial effusion. Mitral Valve: The mitral valve is degenerative in appearance. There is moderate thickening of the mitral valve leaflet(s). There is mild calcification of the mitral valve leaflet(s). Mild to moderate mitral annular calcification. Mild mitral valve regurgitation. No evidence of mitral valve stenosis. MV peak gradient, 9.5 mmHg. The mean mitral valve gradient is 3.0 mmHg. Tricuspid Valve: The tricuspid valve is normal in structure. Tricuspid valve regurgitation is not demonstrated. No evidence of tricuspid stenosis. Aortic Valve: The aortic valve has an indeterminant number of cusps. There is severe calcifcation of the aortic valve. There is severe thickening of the aortic valve. Aortic valve regurgitation is not visualized. Severe aortic stenosis is present. Aortic  valve mean gradient measures 45.0 mmHg. Aortic valve peak gradient measures 67.2 mmHg. Aortic valve area, by VTI measures 0.53 cm. Pulmonic Valve: The pulmonic valve was normal in structure. Pulmonic valve regurgitation is not visualized. No evidence of pulmonic stenosis. Aorta: The aortic root is normal in size and structure. Venous: The inferior  vena cava is dilated in size with less than 50% respiratory variability, suggesting right atrial pressure of 15 mmHg. IAS/Shunts: No atrial level shunt detected by color flow Doppler.  LEFT VENTRICLE PLAX 2D LVIDd:          4.75 cm      Diastology LVIDs:         3.60 cm      LV e' medial:    4.57 cm/s LV PW:         0.95 cm      LV E/e' medial:  30.6 LV IVS:        0.90 cm      LV e' lateral:   5.22 cm/s LVOT diam:     1.95 cm      LV E/e' lateral: 26.8 LV SV:         48 LV SV Index:   26 LVOT Area:     2.99 cm  LV Volumes (MOD) LV vol d, MOD A2C: 140.0 ml LV vol d, MOD A4C: 102.0 ml LV vol s, MOD A2C: 73.1 ml LV vol s, MOD A4C: 60.1 ml LV SV MOD A2C:     66.9 ml LV SV MOD A4C:     102.0 ml LV SV MOD BP:      53.5 ml RIGHT VENTRICLE RV S prime:     10.80 cm/s TAPSE (M-mode): 1.8 cm LEFT ATRIUM             Index        RIGHT ATRIUM           Index LA diam:        4.05 cm 2.16 cm/m   RA Area:     17.20 cm LA Vol (A2C):   56.0 ml 29.87 ml/m  RA Volume:   48.70 ml  25.97 ml/m LA Vol (A4C):   65.9 ml 35.15 ml/m LA Biplane Vol: 64.9 ml 34.61 ml/m  AORTIC VALVE AV Area (Vmax):    0.53 cm AV Area (Vmean):   0.51 cm AV Area (VTI):     0.53 cm AV Vmax:           410.00 cm/s AV Vmean:          322.000 cm/s AV VTI:            0.911 m AV Peak Grad:      67.2 mmHg AV Mean Grad:      45.0 mmHg LVOT Vmax:         72.30 cm/s LVOT Vmean:        55.000 cm/s LVOT VTI:          0.162 m LVOT/AV VTI ratio: 0.18  AORTA Ao Root diam: 3.05 cm MITRAL VALVE                TRICUSPID VALVE MV Area (PHT): 4.68 cm     TR Peak grad:   22.3 mmHg MV Area VTI:   2.14 cm     TR Vmax:        236.00 cm/s MV Peak grad:  9.5 mmHg MV Mean grad:  3.0 mmHg     SHUNTS MV Vmax:       1.54 m/s     Systemic VTI:  0.16 m MV Vmean:      81.8 cm/s    Systemic Diam: 1.95 cm MV Decel Time: 162 msec MV E velocity: 140.00 cm/s MV A velocity: 53.40 cm/s MV E/A ratio:  2.62 Traci  Turner MD Electronically signed by Fransico Him MD Signature Date/Time: 08/10/2022/6:05:45 PM    Final    DG Chest Portable 1 View  Result Date: 08/09/2022 CLINICAL DATA:  Chest pain and syncope EXAM: PORTABLE CHEST 1 VIEW COMPARISON:  Chest x-ray 11/25/2018 FINDINGS: The heart size and  mediastinal contours are within normal limits. Both lungs are clear. The visualized skeletal structures are unremarkable. IMPRESSION: No active disease. Electronically Signed   By: Ronney Asters M.D.   On: 08/09/2022 20:04   Disposition   Pt is being discharged home today in good condition.  Follow-up Plans & Appointments     Follow-up Information     Eileen Stanford, PA-C Follow up on 08/27/2022.   Specialties: Cardiology, Radiology Why: @ 3:30pm. Please arrive at 3:15pm Contact information: [place] [id number] [phone]                  Discharge Medications   Allergies as of 08/20/2022       Reactions   Statins         Medication List     TAKE these medications    aspirin EC 81 MG tablet Take 1 tablet (81 mg total) by mouth daily. Swallow whole.   clopidogrel 75 MG tablet Commonly known as: PLAVIX Take 1 tablet (75 mg total) by mouth daily.   ezetimibe 10 MG tablet Commonly known as: ZETIA Take 1 tablet (10 mg total) by mouth daily.   metoprolol succinate 25 MG 24 hr tablet Commonly known as: TOPROL-XL Take 1 tablet (25 mg total) by mouth daily.   nitroGLYCERIN 0.4 MG SL tablet Commonly known as: NITROSTAT Place 1 tablet (0.4 mg total) under the tongue every 5 (five) minutes x 3 doses as needed for chest pain.          Outstanding Labs/Studies   none  Duration of Discharge Encounter   Greater than 30 minutes including physician time.  SignedAngelena Form, PA-C 08/20/2022, 8:17 AM [phone]

## 2022-08-19 NOTE — Anesthesia Postprocedure Evaluation (Signed)
Anesthesia Post Note  Patient: Jonathan Romero  Procedure(s) Performed: Transcatheter Aortic Valve Replacement, Transfemoral using a 26 MM Edwards SAPIEN 3 Ultra (Chest) INTRAOPERATIVE TRANSTHORACIC ECHOCARDIOGRAM     Patient location during evaluation: PACU Anesthesia Type: MAC Level of consciousness: awake and alert Pain management: pain level controlled Vital Signs Assessment: post-procedure vital signs reviewed and stable Respiratory status: spontaneous breathing, nonlabored ventilation, respiratory function stable and patient connected to nasal cannula oxygen Cardiovascular status: stable and blood pressure returned to baseline Postop Assessment: no apparent nausea or vomiting Anesthetic complications: no  No notable events documented.  Last Vitals:  Vitals:   08/19/22 1710 08/19/22 1713  BP: (!) 109/49 (!) 98/38  Pulse: 61 (!) 47  Resp: 14 16  Temp:  36.7 C  SpO2: 97% 96%    Last Pain:  Vitals:   08/19/22 1713  TempSrc: Temporal  PainSc:                  Myelle Poteat S

## 2022-08-19 NOTE — Op Note (Signed)
[facility]   MULTIDISCIPLINARY HEART VALVE TEAM   TAVR OPERATIVE NOTE   Date of Procedure:  08/19/2022  Preoperative Diagnosis: Severe Aortic Stenosis   Postoperative Diagnosis: Same   Procedure:   Transcatheter Aortic Valve Replacement - Percutaneous  Transfemoral Approach  Edwards Sapien 3 Ultra Resilia THV (size 26 mm, serial [id number])   Co-Surgeons:  Coralie Common, MD and Sherren Mocha, MD  Anesthesiologist:  Myrtie Soman, MD  Echocardiographer:  Sanda Klein, MD  Pre-operative Echo Findings: Severe aortic stenosis Normal left ventricular systolic function  Post-operative Echo Findings: No paravalvular leak Normal/unchanged left ventricular systolic function  BRIEF CLINICAL NOTE AND INDICATIONS FOR SURGERY  76 year old male who recently presented with non-STEMI in the setting of SVT, complicated by frank syncope.  He was found to have severe aortic stenosis with a mean transvalvular gradient of 45 mmHg as well as severe single-vessel coronary artery disease involving the proximal and mid LAD.  Initial plans/recommendations were for AVR/CABG.  However, preoperative CT scan showed heavy calcification of the ascending aorta and he was felt to be a better candidate for PCI and TAVR.  He underwent atherectomy and stenting of the LAD last week.  He returns today for TAVR for definitive treatment of his severe, stage D1 aortic stenosis.  During the course of the patient's preoperative work up they have been evaluated comprehensively by a multidisciplinary team of specialists coordinated through the [facility] in the [facility].  They have been demonstrated to suffer from symptomatic severe aortic stenosis as noted above. The patient has been counseled extensively as to the relative risks and benefits of all options for the treatment of severe aortic stenosis including long term medical therapy, conventional  surgery for aortic valve replacement, and transcatheter aortic valve replacement.  The patient has been independently evaluated in formal cardiac surgical consultation by Dr Cyndia Bent, who deemed the patient appropriate for TAVR. Based upon review of all of the patient's preoperative diagnostic tests they are felt to be candidate for transcatheter aortic valve replacement using the transfemoral approach as an alternative to conventional surgery.    Following the decision to proceed with transcatheter aortic valve replacement, a discussion has been held regarding what types of management strategies would be attempted intraoperatively in the event of life-threatening complications, including whether or not the patient would be considered a candidate for the use of cardiopulmonary bypass and/or conversion to open sternotomy for attempted surgical intervention.  The patient has been advised of a variety of complications that might develop peculiar to this approach including but not limited to risks of death, stroke, paravalvular leak, aortic dissection or other major vascular complications, aortic annulus rupture, device embolization, cardiac rupture or perforation, acute myocardial infarction, arrhythmia, heart block or bradycardia requiring permanent pacemaker placement, congestive heart failure, respiratory failure, renal failure, pneumonia, infection, other late complications related to structural valve deterioration or migration, or other complications that might ultimately cause a temporary or permanent loss of functional independence or other long term morbidity.  The patient provides full informed consent for the procedure as described and all questions were answered preoperatively.  DETAILS OF THE OPERATIVE PROCEDURE  PREPARATION:   The patient is brought to the operating room on the above mentioned date and central monitoring was established by the anesthesia team including placement of a radial arterial  line. The patient is placed in the supine position on the operating table.  Intravenous antibiotics are administered. The patient is monitored  closely throughout the procedure under conscious sedation.    Baseline transthoracic echocardiogram is performed. The patient's chest, abdomen, both groins, and both lower extremities are prepared and draped in a sterile manner. A time out procedure is performed.   PERIPHERAL ACCESS:   Using ultrasound guidance, femoral arterial and venous access is obtained with placement of 6 Fr sheaths on the left side.  [name] images are digitally captured and stored in the patient's chart. A pigtail diagnostic catheter was passed through the femoral arterial sheath under fluoroscopic guidance into the aortic root.  A temporary transvenous pacemaker catheter was passed through the femoral venous sheath under fluoroscopic guidance into the right ventricle.  The pacemaker was tested to ensure stable lead placement and pacemaker capture. Aortic root angiography was performed in order to determine the optimal angiographic angle for valve deployment.  TRANSFEMORAL ACCESS:  A micropuncture technique is used to access the right femoral artery under fluoroscopic and ultrasound guidance.  2 Perclose devices are deployed at 10' and 2' positions to 'PreClose' the femoral artery. An 8 French sheath is placed and then an Amplatz Superstiff wire is advanced through the sheath. This is changed out for a 14 French transfemoral E-Sheath after progressively dilating over the Superstiff wire.  An AL-2 catheter was used to direct a straight-tip exchange length wire across the native aortic valve into the left ventricle. This was exchanged out for a pigtail catheter and position was confirmed in the LV apex. Simultaneous LV and Ao pressures were recorded.  The pigtail catheter was exchanged for an Amplatz Extra-stiff wire in the LV apex.    BALLOON AORTIC VALVULOPLASTY:  Not performed  TRANSCATHETER  HEART VALVE DEPLOYMENT:  An Edwards Sapien 3 transcatheter heart valve (size 26 mm) was prepared and crimped per manufacturer's guidelines, and the proper orientation of the valve is confirmed on the [employer] delivery system. The valve was advanced through the introducer sheath using normal technique until in an appropriate position in the abdominal aorta beyond the sheath tip. The balloon was then retracted and using the fine-tuning wheel was centered on the valve. The valve was then advanced across the aortic arch using appropriate flexion of the catheter. The valve was carefully positioned across the aortic valve annulus. The Commander catheter was retracted using normal technique. Once final position of the valve has been confirmed by angiographic assessment, the valve is deployed while temporarily holding ventilation and during rapid ventricular pacing to maintain systolic blood pressure < 50 mmHg and pulse pressure < 10 mmHg. The balloon inflation is held for >3 seconds after reaching full deployment volume. Once the balloon has fully deflated the balloon is retracted into the ascending aorta and valve function is assessed using echocardiography. The patient's hemodynamic recovery following valve deployment is good.  The deployment balloon and guidewire are both removed. Echo demostrated acceptable post-procedural gradients, stable mitral valve function, and no aortic insufficiency.    PROCEDURE COMPLETION:  The sheath was removed and femoral artery closure is performed using the 2 previously deployed Perclose devices.  Protamine is administered once femoral arterial repair was complete. The site is clear with no evidence of bleeding or hematoma after the sutures are tightened. The temporary pacemaker and pigtail catheters are removed. Mynx closure  is used for contralateral femoral arterial hemostasis for the 6 Fr sheath.  The patient tolerated the procedure well and is transported to the  recovery area in stable condition. There were no immediate intraoperative complications. All sponge instrument and needle counts  are verified correct at completion of the operation.   The patient received a total of 60 mL of intravenous contrast during the procedure.   Sherren Mocha, MD 08/19/2022 4:55 PM

## 2022-08-19 NOTE — Interval H&P Note (Signed)
History and Physical Interval Note:  08/19/2022 12:46 PM  Jonathan Romero  has presented today for surgery, with the diagnosis of Severe Aortic Stenosis.  The various methods of treatment have been discussed with the patient and family. After consideration of risks, benefits and other options for treatment, the patient has consented to  Procedure(s): Transcatheter Aortic Valve Replacement, Transfemoral (N/A) INTRAOPERATIVE TRANSTHORACIC ECHOCARDIOGRAM (N/A) as a surgical intervention.  The patient's history has been reviewed, patient examined, no change in status, stable for surgery.  I have reviewed the patient's chart and labs.  Questions were answered to the patient's satisfaction.     Tonny Bollman

## 2022-08-19 NOTE — Op Note (Signed)
[facility]   MULTIDISCIPLINARY HEART VALVE TEAM   TAVR OPERATIVE NOTE   Date of Procedure:  08/19/2022  Preoperative Diagnosis: Severe Aortic Stenosis   Postoperative Diagnosis: Same   Procedure:   Transcatheter Aortic Valve Replacement - Percutaneous right Transfemoral Approach  Edwards Sapien 3 Ultra THV (size 26 mm, model # 9755RSL, serial # [id number])   Co-Surgeons:  Eugenio Hoes MD and Tonny Bollman, MD    Anesthesiologist:  Eilene Ghazi MD  Echocardiographer:  Irving Burton Senior ET  Pre-operative Echo Findings: Severe aortic stenosis normal  left ventricular systolic function  Post-operative Echo Findings: no paravalvular leak normal left ventricular systolic function   BRIEF CLINICAL NOTE AND INDICATIONS FOR SURGERY  A very healthy 76 yo wm with syncope and SVT found to have severe AS and CAD. Pt has been in NSR and echo with normal LV function and severe AS with a mean gradient of and cath with LAD and RCA stenosis. Pt had a calcified ascending aorta and was felt to be best served with TAVR and PCI of LAD    DETAILS OF THE OPERATIVE PROCEDURE  PREPARATION:    The patient was brought to the operating room on the above mentioned date and appropriate monitoring was established by the anesthesia team. The patient was placed in the supine position on the operating table.  Intravenous antibiotics were administered. The patient was monitored closely throughout the procedure under conscious sedation.   Baseline transthoracic echocardiogram was performed. The patient's abdomen and both groins were prepped and draped in a sterile manner. A time out procedure was performed.   PERIPHERAL ACCESS:    Using the modified Seldinger technique, femoral arterial and venous access was obtained with placement of 6 Fr sheaths on the left side.  A pigtail diagnostic catheter was passed through the left arterial sheath under fluoroscopic guidance into the aortic  root.  A temporary transvenous pacemaker catheter was passed through the left femoral venous sheath under fluoroscopic guidance into the right ventricle.  The pacemaker was tested to ensure stable lead placement and pacemaker capture. Aortic root angiography was performed in order to determine the optimal angiographic angle for valve deployment.   TRANSFEMORAL ACCESS:   Percutaneous transfemoral access and sheath placement was performed using ultrasound guidance.  The right common femoral artery was cannulated using a micropuncture needle and appropriate location was verified using hand injection angiogram.  A pair of Abbott Perclose percutaneous closure devices were placed and a 6 French sheath replaced into the femoral artery.  The patient was heparinized systemically and ACT verified > 250 seconds.    A 14 Fr transfemoral E-sheath was introduced into the right common femoral artery after progressively dilating over an Amplatz superstiff wire. An AL2 catheter was used to direct a straight-tip exchange length wire across the native aortic valve into the left ventricle. This was exchanged out for a pigtail catheter and position was confirmed in the LV apex. Simultaneous LV and Ao pressures were recorded.  The pigtail catheter was exchanged for a [place] wire in the LV apex.   BALLOON AORTIC VALVULOPLASTY:   Was not done   TRANSCATHETER HEART VALVE DEPLOYMENT:   An Edwards Sapien 3 Ultra transcatheter heart valve (size 26 mm) was prepared and crimped per manufacturer's guidelines, and the proper orientation of the valve is confirmed on the [employer] delivery system. The valve was advanced through the introducer sheath using normal technique until in an appropriate position in the abdominal aorta beyond the  sheath tip. The balloon was then retracted and using the fine-tuning wheel was centered on the valve. The valve was then advanced across the aortic arch using appropriate flexion of the  catheter. The valve was carefully positioned across the aortic valve annulus. The Commander catheter was retracted using normal technique. Once final position of the valve has been confirmed by angiographic assessment, the valve is deployed during rapid ventricular pacing to maintain systolic blood pressure < 50 mmHg and pulse pressure < 10 mmHg. The balloon inflation is held for >3 seconds after reaching full deployment volume. Once the balloon has fully deflated the balloon is retracted into the ascending aorta and valve function is assessed using echocardiography. There is felt to be no paravalvular leak and no central aortic insufficiency.  The patient's hemodynamic recovery following valve deployment is good.  The deployment balloon and guidewire are both removed.    PROCEDURE COMPLETION:   The sheath was removed and femoral artery closure performed.  Protamine was administered once femoral arterial repair was complete. The temporary pacemaker, pigtail catheter and femoral sheaths were removed with manual pressure used for venous hemostasis.  A Mynx femoral closure device was utilized following removal of the diagnostic sheath in the left femoral artery.  The patient tolerated the procedure well and is transported to the cath lab recovery area in stable condition. There were no immediate intraoperative complications. All sponge instrument and needle counts are verified correct at completion of the operation.   No blood products were administered during the operation.  The patient received a total of 60 mL of intravenous contrast during the procedure.   Eugenio Hoes, MD 08/19/2022 4:42 PM

## 2022-08-19 NOTE — Progress Notes (Signed)
Per Dr. Laneta Simmers, hold metoprolol tonight for HR 60.

## 2022-08-19 NOTE — Transfer of Care (Signed)
Immediate Anesthesia Transfer of Care Note  Patient: Jonathan Romero  Procedure(s) Performed: Transcatheter Aortic Valve Replacement, Transfemoral using a 26 MM Edwards SAPIEN 3 Ultra (Chest) INTRAOPERATIVE TRANSTHORACIC ECHOCARDIOGRAM  Patient Location: PACU and Cath Lab  Anesthesia Type:MAC  Level of Consciousness: drowsy and patient cooperative  Airway & Oxygen Therapy: Patient Spontanous Breathing and Patient connected to nasal cannula oxygen  Post-op Assessment: Report given to RN and Post -op Vital signs reviewed and stable  Post vital signs: Reviewed and stable  Last Vitals:  Vitals Value Taken Time  BP 130/65 08/19/22 1645  Temp 36.4 C 08/19/22 1643  Pulse 48 08/19/22 1647  Resp 15 08/19/22 1647  SpO2 98 % 08/19/22 1647  Vitals shown include unvalidated device data.  Last Pain:  Vitals:   08/19/22 1643  TempSrc: Temporal  PainSc: 0-No pain         Complications: No notable events documented.

## 2022-08-20 ENCOUNTER — Inpatient Hospital Stay (HOSPITAL_COMMUNITY): Payer: Medicare Other

## 2022-08-20 ENCOUNTER — Encounter (HOSPITAL_COMMUNITY): Payer: Self-pay | Admitting: Cardiovascular Disease

## 2022-08-20 DIAGNOSIS — Z20822 Contact with and (suspected) exposure to covid-19: Secondary | ICD-10-CM | POA: Diagnosis not present

## 2022-08-20 DIAGNOSIS — I471 Supraventricular tachycardia, unspecified: Secondary | ICD-10-CM | POA: Diagnosis not present

## 2022-08-20 DIAGNOSIS — I350 Nonrheumatic aortic (valve) stenosis: Secondary | ICD-10-CM | POA: Diagnosis not present

## 2022-08-20 DIAGNOSIS — Z006 Encounter for examination for normal comparison and control in clinical research program: Secondary | ICD-10-CM | POA: Diagnosis not present

## 2022-08-20 DIAGNOSIS — Z952 Presence of prosthetic heart valve: Secondary | ICD-10-CM

## 2022-08-20 LAB — BASIC METABOLIC PANEL
Anion gap: 6 (ref 5–15)
BUN: 17 mg/dL (ref 8–23)
CO2: 25 mmol/L (ref 22–32)
Calcium: 8.4 mg/dL — ABNORMAL LOW (ref 8.9–10.3)
Chloride: 106 mmol/L (ref 98–111)
Creatinine, Ser: 0.91 mg/dL (ref 0.61–1.24)
GFR, Estimated: >60 mL/min (ref >60)
Glucose, Bld: 134 mg/dL — ABNORMAL HIGH (ref 70–99)
Potassium: 3.9 mmol/L (ref 3.5–5.1)
Sodium: 137 mmol/L (ref 135–145)

## 2022-08-20 LAB — PSA: Prostatic Specific Antigen: 3.88 ng/mL (ref 0.00–4.00)

## 2022-08-20 LAB — CBC
HCT: 35.2 % — ABNORMAL LOW (ref 39.0–52.0)
Hemoglobin: 12 g/dL — ABNORMAL LOW (ref 13.0–17.0)
MCH: 34.5 pg — ABNORMAL HIGH (ref 26.0–34.0)
MCHC: 34.1 g/dL (ref 30.0–36.0)
MCV: 101.1 fL — ABNORMAL HIGH (ref 80.0–100.0)
Platelets: 219 10*3/uL (ref 150–400)
RBC: 3.48 MIL/uL — ABNORMAL LOW (ref 4.22–5.81)
RDW: 14.1 % (ref 11.5–15.5)
WBC: 7.1 10*3/uL (ref 4.0–10.5)
nRBC: 0 % (ref 0.0–0.2)

## 2022-08-20 LAB — ECHOCARDIOGRAM COMPLETE
AR max vel: 1.26 cm2
AV Area VTI: 1.46 cm2
AV Area mean vel: 1.33 cm2
AV Mean grad: 12 mmHg
AV Peak grad: 22.8 mmHg
Ao pk vel: 2.39 m/s
Area-P 1/2: 2.97 cm2
Height: 70 in
S' Lateral: 3.1 cm
Weight: 2518.54 oz

## 2022-08-20 LAB — MAGNESIUM: Magnesium: 2.2 mg/dL (ref 1.7–2.4)

## 2022-08-20 MED ORDER — BISACODYL 5 MG PO TBEC
5.0000 mg | DELAYED_RELEASE_TABLET | Freq: Every day | ORAL | Status: DC | PRN
  Administered 2022-08-20: 5 mg via ORAL
  Filled 2022-08-20: qty 1

## 2022-08-20 NOTE — Progress Notes (Signed)
Pt has walked this morning w/o symptoms. Pt was educated on heart healthy diet, ex guidelines (progressive walking, light RT, and static stretching) wt restrictions, no baths/daily wash-ups, and CRPII. Pt is not interested in CRPII.

## 2022-08-20 NOTE — Progress Notes (Signed)
Pt being transported out of room, will f/u later.

## 2022-08-20 NOTE — Plan of Care (Signed)
  Problem: Health Behavior/Discharge Planning: Goal: Ability to safely manage health-related needs after discharge will improve Outcome: Progressing   

## 2022-08-20 NOTE — Progress Notes (Signed)
Echocardiogram 2D Echocardiogram has been performed.   Warren Lacy Tylor Gambrill RDCS 08/20/2022, 9:48 AM

## 2022-08-20 NOTE — Progress Notes (Signed)
  5 Meter Walk Test:  Trial 1 9.00 seconds  Trial 2 8.21 seconds  Trial 3 8.96 seconds  3 Trial Average/Gait Speed 8.72 seconds/1.89 ft/sec (<1.8 ft/sec indicates high fall risk)   Updated note from PT Evaluation 08/13/22 to include 5-meter walk test trial values, in addition to gait speed.  Ina Homes, PT, DPT Acute Rehabilitation Services  Personal: Secure Chat Rehab Office: [phone]

## 2022-08-21 ENCOUNTER — Telehealth: Payer: Self-pay | Admitting: Physician Assistant

## 2022-08-21 NOTE — Telephone Encounter (Signed)
  [facility]   MULTIDISCIPLINARY HEART VALVE TEAM   Patient's son contacted regarding discharge from [facility] on 11/29  Patient understands to follow up with a structural heart APP on 12/6 at [facility].  Patient understands discharge instructions? yes Patient understands medications and regimen? yes Patient understands to bring all medications to this visit? yes  Cline Crock PA-C  [facility]

## 2022-08-25 ENCOUNTER — Encounter (HOSPITAL_COMMUNITY): Payer: Medicare Other

## 2022-08-26 NOTE — Progress Notes (Unsigned)
HEART AND [place]                                     Cardiology Office Note:    Date:  08/28/2022   ID:  ZYSHAWN EICHINGER, DOB 1946-06-21, MRN [id number]  PCP:  Sharilyn Sites, MD  [facility] Cardiologist:  Peter Martinique, MD / Dr. Burt Knack and Dr. Lavonna Monarch (TAVR) [facility] Electrophysiologist:  None   Referring MD: Sharilyn Sites, MD   [facility] s/p TAVR  History of Present Illness:    Jonathan Romero is a 76 y.o. male with a hx of HLD who was recently admitted for syncope and diagnosed with SVT, CAD requiring LAD PCI/DES x3 and severe AS s/p TAVR (08/19/22) who presents to clinic for follow up.   Pt has no significant past medical history by virtue of not routinely seeing a regular medical doctor.    On 08/09/22 he had two episodes of exertional syncope episode. His wife called 79 and EMS found the patient in SVT with a reported HR of 280. He was then cardioverted into NSR. Upon arrival to the ED he was found to have a heart murmur, positive troponin, NSTEMI. Echo 08/10/22 revealed EF 40-45% with severe AS with a mean gradient of 45.0 mmHg, peak gradient of 67.97mmHg, and AVA 0.53 cm2. R/LHC was 08/11/22 showed a 95% calcified LAD lesion. He was  evaluated by the multidisciplinary valve team and felt to not be a surgical candidate due to significant aortic calcification found on CTA. He ultimately underwent successful PCI with orbital atherectomy, PTCA, and placement of 3 overlapping drug-eluting stents from the proximal to mid LAD. He was discharged home on 08/14/22 with plans to return on 08/19/22 for TAVR.  He underwent successful TAVR with a 26 mm Edwards Sapien 3 Ultra Resilia THV via the TF approach on 08/19/22. Post operative echo showed EF 55%, normally functioning TAVR with a mean gradient of 12 mmHg and no PVL. He was continued on DAPT.   Today the patient presents to clinic for follow up. Here with his son. No CP or SOB. No LE edema,  orthopnea or PND. No dizziness or syncope. No blood in stool or urine. No palpitations.    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 Burt Knack & Weldner   Severe aortic stenosis     Past Surgical History:  Procedure Laterality Date   CATARACT EXTRACTION     CORONARY ATHERECTOMY N/A 08/13/2022   Procedure: CORONARY ATHERECTOMY;  Surgeon: Sherren Mocha, MD;  Location: [place] [facility];  Service: Cardiovascular;  Laterality: N/A;   CORONARY STENT INTERVENTION N/A 08/13/2022   Procedure: CORONARY STENT INTERVENTION;  Surgeon: Sherren Mocha, MD;  Location: [place] [facility];  Service: Cardiovascular;  Laterality: N/A;   DOPPLER ECHOCARDIOGRAPHY  12/21/2012   EF  55-60%,   DOPPLER ECHOCARDIOGRAPHY  12/16/2011   MILD AORTIC STENOSIS,PEAK AND MEAN GRADIENTS OF 18 AND 8 mmHg and a valve area of around 2 square cm.    INTRAOPERATIVE TRANSTHORACIC ECHOCARDIOGRAM N/A 08/19/2022   Procedure: INTRAOPERATIVE TRANSTHORACIC ECHOCARDIOGRAM;  Surgeon: Sherren Mocha, MD;  Location: [place];  Service: Open Heart Surgery;  Laterality: N/A;   RIGHT/LEFT HEART CATH AND CORONARY ANGIOGRAPHY N/A 08/11/2022   Procedure: RIGHT/LEFT HEART CATH AND CORONARY ANGIOGRAPHY;  Surgeon: Belva Crome, MD;  Location: [place] [facility];  Service: Cardiovascular;  Laterality: N/A;   TRANSCATHETER AORTIC VALVE REPLACEMENT, TRANSFEMORAL N/A 08/19/2022   Procedure: Transcatheter Aortic Valve Replacement, Transfemoral  using a 26 MM Edwards SAPIEN 3 Ultra;  Surgeon: Sherren Mocha, MD;  Location: [place];  Service: Open Heart Surgery;  Laterality: N/A;  Transfemoral    Current Medications: Current Meds  Medication Sig   amoxicillin (AMOXIL) 500 MG tablet Take 4 tablets (2,000 mg total) by mouth as directed. 1 hour prior to dental work including cleanings   aspirin EC 81 MG tablet Take 1 tablet (81 mg total) by mouth daily. Swallow whole.   clopidogrel (PLAVIX) 75 MG tablet Take 1 tablet (75 mg total) by mouth daily.   ezetimibe (ZETIA) 10 MG tablet Take 1 tablet (10 mg total) by mouth daily.   metoprolol succinate (TOPROL-XL) 25 MG 24 hr tablet Take 1 tablet (25 mg total) by mouth daily.   nitroGLYCERIN (NITROSTAT) 0.4 MG SL tablet Place 1 tablet (0.4 mg total) under the tongue every 5 (five) minutes x 3 doses as needed for chest pain.     Allergies:   Statins   Social History   Socioeconomic History   Marital status: Married    Spouse name: Henrita   Number of children: 3   Years of education: Not on file   Highest education level: High school graduate  Occupational History   Occupation: retired  Tobacco Use   Smoking status: Former    Types: Cigarettes    Quit date: 04/15/1983    Years since quitting: 39.3   Smokeless tobacco: Never  Vaping Use   Vaping Use: Never used  Substance and Sexual Activity   Alcohol use: Yes    Comment: beer occasionally   Drug use: No   Sexual activity: Not on file  Other Topics Concern   Not on file  Social History Narrative   Not on file   Social Determinants of Health   Financial Resource Strain: Low Risk  (08/13/2022)   Overall Financial Resource Strain (CARDIA)    Difficulty of Paying Living Expenses: Not hard at all  Food Insecurity: No Food Insecurity (08/10/2022)   Hunger Vital Sign    Worried About Running Out of Food in the Last Year: Never true    [place] in the Last Year: Never true  Transportation Needs: No Transportation Needs  (08/13/2022)   PRAPARE - Hydrologist (Medical): No    Lack of Transportation (Non-Medical): No  Physical Activity: Not on file  Stress: Not on file  Social Connections: Not on file     Family History: The patient's family history includes Diabetes in his mother; Heart failure in his father.  ROS:   Please see the history of present illness.    All other systems reviewed and are negative.  EKGs/Labs/Other Studies Reviewed:    The following studies were reviewed today:  TAVR OPERATIVE NOTE     Date  of Procedure:                08/19/2022   Preoperative Diagnosis:      Severe Aortic Stenosis    Postoperative Diagnosis:    Same    Procedure:        Transcatheter Aortic Valve Replacement - Percutaneous  Transfemoral Approach             Edwards Sapien 3 Ultra Resilia THV (size 26 mm, serial #[id number])              Co-Surgeons:                        Eugenio Hoes, MD and Tonny Bollman, MD   Anesthesiologist:                  Eilene Ghazi, MD   Echocardiographer:              Thurmon Fair, MD   Pre-operative Echo Findings: Severe aortic stenosis Normal left ventricular systolic function   Post-operative Echo Findings: No paravalvular leak Normal/unchanged left ventricular systolic function   ___________________________     Echo 08/20/22:  IMPRESSIONS  1. Left ventricular ejection fraction, by estimation, is 55 to 60%. The  left ventricle has normal function. The left ventricle demonstrates  regional wall motion abnormalities (see scoring diagram/findings for  description). Left ventricular diastolic  parameters are consistent with Grade II diastolic dysfunction  (pseudonormalization). Elevated left atrial pressure. There is moderate  hypokinesis of the left ventricular, basal-mid inferolateral wall.   2. Right ventricular systolic function is normal. The right ventricular  size is normal. There is mildly elevated pulmonary artery  systolic  pressure.   3. Left atrial size was moderately dilated.   4. The mitral valve is normal in structure. Trivial mitral valve  regurgitation. Moderate mitral annular calcification.   5. The aortic valve has been repaired/replaced. Aortic valve  regurgitation is not visualized. There is a valve present in the aortic  position. Echo findings are consistent with normal structure and function  of the aortic valve prosthesis. Aortic valve  mean gradient measures 12.0 mmHg. Aortic valve Vmax measures 2.39 m/s.  Aortic valve acceleration time measures 85 msec.   6. The inferior vena cava is dilated in size with <50% respiratory  variability, suggesting right atrial pressure of 15 mmHg.   Comparison(s): Prior images reviewed side by side. The left ventricular  function has improved.    EKG:  EKG is ordered today.  The ekg ordered today demonstrates sinus brady, HR 54bpm, mild 1st deg AV block PR 204  Recent Labs: 08/09/2022: B Natriuretic Peptide 486.3; TSH 2.157 08/18/2022: ALT 15 08/20/2022: BUN 17; Creatinine, Ser 0.91; Hemoglobin 12.0; Magnesium 2.2; Platelets 219; Potassium 3.9; Sodium 137  Recent Lipid Panel    Component Value Date/Time   CHOL 215 (H) 08/10/2022 2136   CHOL 200 (H) 03/21/2013 1142   TRIG 49 08/10/2022 2136   TRIG 192 (H) 03/21/2013 1142   HDL 45 08/10/2022 2136   HDL 31 (L) 03/21/2013 1142   CHOLHDL 4.8 08/10/2022 2136   VLDL 10 08/10/2022 2136   LDLCALC 160 (H) 08/10/2022 2136   LDLCALC 131 (H) 03/21/2013 1142     Risk Assessment/Calculations:       Physical Exam:    VS:  BP 138/70   Pulse (!) 54   Ht 5' 10.5" (1.791 m)   Wt 152 lb 9.6 oz (69.2 kg)  SpO2 99%   BMI 21.59 kg/m     Wt Readings from Last 3 Encounters:  08/27/22 152 lb 9.6 oz (69.2 kg)  08/20/22 157 lb 6.5 oz (71.4 kg)  08/13/22 157 lb 3 oz (71.3 kg)     GEN:  Well nourished, well developed in no acute distress HEENT: Normal NECK: No JVD LYMPHATICS: No  lymphadenopathy CARDIAC: RRR, no murmurs, rubs, gallops RESPIRATORY:  Clear to auscultation without rales, wheezing or rhonchi  ABDOMEN: Soft, non-tender, non-distended MUSCULOSKELETAL:  No edema; No deformity  SKIN: Warm and dry.  Groin sites clear without hematoma or ecchymosis  NEUROLOGIC:  Alert and oriented x 3 PSYCHIATRIC:  Normal affect   ASSESSMENT:    1. S/P TAVR (transcatheter aortic valve replacement)   2. CAD S/P percutaneous coronary angioplasty   3. SVT (supraventricular tachycardia)   4. Prostate enlargement   5. Hyperlipidemia, unspecified hyperlipidemia type   6. Statin intolerance    PLAN:    In order of problems listed above:  Severe AS s/p TAVR: doing great 1 week out from TAVR. ECG with no HAVB. Groin sites are stable. Continued on DAPT Asprin and Plavix given recent PCI. SBE prophylaxis discussed; I have RX'd amoxicillin. I will see him back for 1 month follow up and echo.    HLD: continue ezetimibe 10mg  daily. LDL 160. Recent ACS. He is statin intolerant (has been tried on at least 3 by previous PCP). Will refer to lipid clinic to consider PCSK9i   CAD: underwent successful PCI with orbital atherectomy, PTCA, and placement of 3 overlapping drug-eluting stents from the proximal to mid LAD on 08/13/22. Continue DAPT with aspirin/plavix BB. AS above, statin intolerant.    SVT: stable, no SVT episodes since EMS cardioversion last admission. Continue metoprolol 25mg  daily.   Prostatomegaly: pre TAVR CT reported prostatomegaly with focal area of focal area of hyperenhancement of the left prostate, recommend correlation with PSA. PSA ordered and normal. Son said this is followed by PCP   Medication Adjustments/Labs and Tests Ordered: Current medicines are reviewed at length with the patient today.  Concerns regarding medicines are outlined above.  Orders Placed This Encounter  Procedures   AMB Referral to [facility]   EKG 12-Lead   Meds  ordered this encounter  Medications   amoxicillin (AMOXIL) 500 MG tablet    Sig: Take 4 tablets (2,000 mg total) by mouth as directed. 1 hour prior to dental work including cleanings    Dispense:  12 tablet    Refill:  12    Order Specific Question:   Supervising Provider    Answer:   [place], [place]    Patient Instructions  Medication Instructions:  TAKE Amoxicillin 2000mg  one hour prior to dental cleanings/extractions *If you need a refill on your cardiac medications before your next appointment, please call your pharmacy*   Lab Work: NONE If you have labs (blood work) drawn today and your tests are completely normal, you will receive your results only by: [place] (if you have MyChart) OR A paper copy in the mail If you have any lab test that is abnormal or we need to change your treatment, we will call you to review the results.   Testing/Procedures: NONE   Follow-Up: At [facility], you and your health needs are our priority.  As part of our continuing mission to provide you with exceptional heart care, we have created designated Provider Care Teams.  These Care Teams include your primary  Cardiologist (physician) and Advanced Practice Providers (APPs -  Physician Assistants and Nurse Practitioners) who all work together to provide you with the care you need, when you need it.  We recommend signing up for the patient portal called "MyChart".  Sign up information is provided on this After Visit Summary.  MyChart is used to connect with patients for Virtual Visits (Telemedicine).  Patients are able to view lab/test results, encounter notes, upcoming appointments, etc.  Non-urgent messages can be sent to your provider as well.   To learn more about what you can do with MyChart, go to [email or web address].    Your next appointment:   09/24/22: ECHO at 2:45, office visit at 4pm  The format for your next appointment:   In Person  Provider:   Structural  Team    Important Information About Sugar         Signed, Cline Crock, PA-C  08/28/2022 4:02 PM    [facility]

## 2022-08-27 ENCOUNTER — Ambulatory Visit: Payer: Medicare Other | Attending: Physician Assistant | Admitting: Physician Assistant

## 2022-08-27 ENCOUNTER — Encounter: Payer: Self-pay | Admitting: Physician Assistant

## 2022-08-27 VITALS — BP 138/70 | HR 54 | Ht 70.5 in | Wt 152.6 lb

## 2022-08-27 DIAGNOSIS — Z9861 Coronary angioplasty status: Secondary | ICD-10-CM

## 2022-08-27 DIAGNOSIS — I471 Supraventricular tachycardia, unspecified: Secondary | ICD-10-CM

## 2022-08-27 DIAGNOSIS — Z789 Other specified health status: Secondary | ICD-10-CM

## 2022-08-27 DIAGNOSIS — N400 Enlarged prostate without lower urinary tract symptoms: Secondary | ICD-10-CM

## 2022-08-27 DIAGNOSIS — E785 Hyperlipidemia, unspecified: Secondary | ICD-10-CM

## 2022-08-27 DIAGNOSIS — Z952 Presence of prosthetic heart valve: Secondary | ICD-10-CM

## 2022-08-27 DIAGNOSIS — I2510 Atherosclerotic heart disease of native coronary artery without angina pectoris: Secondary | ICD-10-CM | POA: Diagnosis not present

## 2022-08-27 MED ORDER — AMOXICILLIN 500 MG PO TABS: 2000.0000 mg | ORAL_TABLET | ORAL | 12 refills | Status: DC

## 2022-08-27 NOTE — Patient Instructions (Signed)
Medication Instructions:  TAKE Amoxicillin 2000mg  one hour prior to dental cleanings/extractions *If you need a refill on your cardiac medications before your next appointment, please call your pharmacy*   Lab Work: NONE If you have labs (blood work) drawn today and your tests are completely normal, you will receive your results only by: MyChart Message (if you have MyChart) OR A paper copy in the mail If you have any lab test that is abnormal or we need to change your treatment, we will call you to review the results.   Testing/Procedures: NONE   Follow-Up: At [facility], you and your health needs are our priority.  As part of our continuing mission to provide you with exceptional heart care, we have created designated Provider Care Teams.  These Care Teams include your primary Cardiologist (physician) and Advanced Practice Providers (APPs -  Physician Assistants and Nurse Practitioners) who all work together to provide you with the care you need, when you need it.  We recommend signing up for the patient portal called "MyChart".  Sign up information is provided on this After Visit Summary.  MyChart is used to connect with patients for Virtual Visits (Telemedicine).  Patients are able to view lab/test results, encounter notes, upcoming appointments, etc.  Non-urgent messages can be sent to your provider as well.   To learn more about what you can do with MyChart, go to [facility].    Your next appointment:   09/24/22: ECHO at 2:45, office visit at 4pm  The format for your next appointment:   In Person  Provider:   Structural Team    Important Information About Sugar

## 2022-09-23 NOTE — Progress Notes (Signed)
[facility]   MULTIDISCIPLINARY HEART VALVE CLINIC                                     Cardiology Office Note:    Date:  09/25/2022   ID:  Jonathan Romero, DOB 12/23/1945, MRN [id number]  PCP:  Assunta FoundGolding, John, MD  [facility] Cardiologist:  Peter SwazilandJordan, MD / Dr. Excell Seltzerooper and Dr. Leafy RoWeldner (TAVR) [facility] Electrophysiologist:  None   Referring MD: Assunta FoundGolding, John, MD   1 month s/p TAVR  History of Present Illness:    Jonathan Romero is a 77 y.o. male with a hx of HLD who was recently admitted for syncope and diagnosed with SVT, CAD requiring LAD PCI/DES x3 and severe AS s/p TAVR (08/19/22) who presents to clinic for follow up.   Pt has no significant past medical history by virtue of not routinely seeing a regular medical doctor.    On 08/09/22 he had two episodes of exertional syncope episode. His wife called 911 and EMS found the patient in SVT with a reported HR of 280. He was then cardioverted into NSR. Upon arrival to the ED he was found to have a heart murmur, positive troponin, and NSTEMI. Echo 08/10/22 revealed EF 40-45% with severe AS with a mean gradient of 45.0 mmHg, peak gradient of 67.592mmHg, and AVA 0.53 cm2. R/LHC was 08/11/22 showed a 95% calcified LAD lesion. He was evaluated by the multidisciplinary valve team and felt to not be a surgical candidate due to significant aortic calcification found on CTA. He ultimately underwent successful PCI with orbital atherectomy, PTCA, and placement of 3 overlapping drug-eluting stents from the proximal to mid LAD. He was discharged home on 08/14/22 with plans to return on 08/19/22 for TAVR.  He underwent successful TAVR with a 26 mm Edwards Sapien 3 Ultra Resilia THV via the TF approach on 08/19/22. Post operative echo showed EF 55%, normally functioning TAVR with a mean gradient of 12 mmHg and no PVL. He was continued on DAPT.   Today the patient presents to clinic for follow up. Here is here alone. No CP or SOB. No LE  edema, orthopnea or PND. No dizziness or syncope. No blood in stool or urine. No palpitations.    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 Excell Seltzerooper & Weldner   Severe aortic stenosis     Past Surgical History:  Procedure Laterality Date   CATARACT EXTRACTION     CORONARY ATHERECTOMY N/A 08/13/2022   Procedure: CORONARY ATHERECTOMY;  Surgeon: Tonny Bollmanooper, Michael, MD;  [facility];  Service: Cardiovascular;  Laterality: N/A;   CORONARY STENT INTERVENTION N/A 08/13/2022   Procedure: CORONARY STENT INTERVENTION;  Surgeon: Tonny Bollmanooper, Michael, MD;  [facility];  Service: Cardiovascular;  Laterality: N/A;   DOPPLER ECHOCARDIOGRAPHY  12/21/2012  EF 55-60%,   DOPPLER ECHOCARDIOGRAPHY  12/16/2011   MILD AORTIC STENOSIS,PEAK AND MEAN GRADIENTS OF 18 AND 8 mmHg and a valve area of around 2 square cm.    INTRAOPERATIVE TRANSTHORACIC ECHOCARDIOGRAM N/A 08/19/2022   Procedure: INTRAOPERATIVE TRANSTHORACIC ECHOCARDIOGRAM;  Surgeon: Sherren Mocha, MD;  Location: [place];  Service: Open Heart Surgery;  Laterality: N/A;   RIGHT/LEFT HEART CATH AND CORONARY ANGIOGRAPHY N/A 08/11/2022   Procedure: RIGHT/LEFT HEART CATH AND CORONARY ANGIOGRAPHY;  Surgeon: Belva Crome, MD;  Location: [place] CV LAB;  Service: Cardiovascular;  Laterality: N/A;   TRANSCATHETER AORTIC VALVE REPLACEMENT, TRANSFEMORAL N/A 08/19/2022   Procedure: Transcatheter Aortic Valve Replacement,  Transfemoral using a 26 MM Edwards SAPIEN 3 Ultra;  Surgeon: Sherren Mocha, MD;  Location: [place];  Service: Open Heart Surgery;  Laterality: N/A;  Transfemoral    Current Medications: Current Meds  Medication Sig   amoxicillin (AMOXIL) 500 MG tablet Take 4 tablets (2,000 mg total) by mouth as directed. 1 hour prior to dental work including cleanings   apixaban (ELIQUIS) 5 MG TABS tablet Take 1 tablet (5 mg total) by mouth 2 (two) times daily.   aspirin EC 81 MG tablet Take 1 tablet (81 mg total) by mouth daily. Swallow whole.   clopidogrel (PLAVIX) 75 MG tablet Take 1 tablet (75 mg total) by mouth daily.   ezetimibe (ZETIA) 10 MG tablet Take 1 tablet (10 mg total) by mouth daily.   metoprolol succinate (TOPROL XL) 25 MG 24 hr tablet Take 1 tablet (25 mg total) by mouth 2 (two) times daily.   nitroGLYCERIN (NITROSTAT) 0.4 MG SL tablet Place 1 tablet (0.4 mg total) under the tongue every 5 (five) minutes x 3 doses as needed for chest pain.   [DISCONTINUED] metoprolol succinate (TOPROL-XL) 25 MG 24 hr tablet Take 1 tablet (25 mg total) by mouth daily.     Allergies:   Statins   Social History   Socioeconomic History   Marital status: Married    Spouse name: Henrita   Number of children: 3   Years of education: Not on file   Highest education level: High school graduate  Occupational History   Occupation: retired  Tobacco Use   Smoking status: Former    Types: Cigarettes    Quit date: 04/15/1983    Years since quitting: 39.4   Smokeless tobacco: Never  Vaping Use   Vaping Use: Never used  Substance and Sexual Activity   Alcohol use: Yes    Comment: beer occasionally   Drug use: No   Sexual activity: Not on file  Other Topics Concern   Not on file  Social History Narrative   Not on file   Social Determinants of Health   Financial Resource Strain: Low Risk  (08/13/2022)   Overall Financial Resource Strain (CARDIA)    Difficulty of Paying Living Expenses: Not hard at all   Food Insecurity: No Food Insecurity (08/10/2022)   Hunger Vital Sign    Worried About Running Out of Food in the Last Year: Never true    [place] in the Last Year: Never true  Transportation Needs: No Transportation Needs (08/13/2022)   PRAPARE - Hydrologist (Medical): No    Lack of Transportation (Non-Medical): No  Physical Activity: Not on file  Stress: Not on file  Social Connections: Not on file     Family History: The patient's family history includes Diabetes in his mother; Heart failure in  his father.  ROS:   Please see the history of present illness.    All other systems reviewed and are negative.  EKGs/Labs/Other Studies Reviewed:    The following studies were reviewed today:  TAVR OPERATIVE NOTE     Date of Procedure:                08/19/2022   Preoperative Diagnosis:      Severe Aortic Stenosis    Postoperative Diagnosis:    Same    Procedure:        Transcatheter Aortic Valve Replacement - Percutaneous  Transfemoral Approach             Edwards Sapien 3 Ultra Resilia THV (size 26 mm, serial #[id number])              Co-Surgeons:                        Coralie Common, MD and Sherren Mocha, MD   Anesthesiologist:                  Myrtie Soman, MD   Echocardiographer:              Sanda Klein, MD   Pre-operative Echo Findings: Severe aortic stenosis Normal left ventricular systolic function   Post-operative Echo Findings: No paravalvular leak Normal/unchanged left ventricular systolic function   ___________________________     Echo 08/20/22:  IMPRESSIONS  1. Left ventricular ejection fraction, by estimation, is 55 to 60%. The  left ventricle has normal function. The left ventricle demonstrates  regional wall motion abnormalities (see scoring diagram/findings for  description). Left ventricular diastolic  parameters are consistent with Grade II diastolic dysfunction  (pseudonormalization). Elevated left  atrial pressure. There is moderate  hypokinesis of the left ventricular, basal-mid inferolateral wall.   2. Right ventricular systolic function is normal. The right ventricular  size is normal. There is mildly elevated pulmonary artery systolic  pressure.   3. Left atrial size was moderately dilated.   4. The mitral valve is normal in structure. Trivial mitral valve  regurgitation. Moderate mitral annular calcification.   5. The aortic valve has been repaired/replaced. Aortic valve  regurgitation is not visualized. There is a valve present in the aortic  position. Echo findings are consistent with normal structure and function  of the aortic valve prosthesis. Aortic valve  mean gradient measures 12.0 mmHg. Aortic valve Vmax measures 2.39 m/s.  Aortic valve acceleration time measures 85 msec.   6. The inferior vena cava is dilated in size with <50% respiratory  variability, suggesting right atrial pressure of 15 mmHg.   Comparison(s): Prior images reviewed side by side. The left ventricular  function has improved.   ______________________  Echo 09/24/22 IMPRESSIONS   1. Left ventricular ejection fraction, by estimation, is 40 to 45%. The  left ventricle has mildly decreased function. The left ventricle  demonstrates global hypokinesis. Left ventricular diastolic function could  not be evaluated.   2. Right ventricular systolic function is normal. The right ventricular  size is normal. There is normal pulmonary artery systolic pressure.   3. Left atrial size was moderately dilated.   4. The mitral valve is normal in structure. Trivial mitral valve  regurgitation. No evidence of mitral stenosis.   5. The aortic valve has been repaired/replaced. Aortic valve  regurgitation is not visualized. There is a 26 mm Edwards Sapien  prosthetic (TAVR) valve present in the aortic position. Echo findings  are  consistent with normal structure and function of the  aortic valve prosthesis. Aortic  valve area, by VTI measures 1.65 cm.  Aortic valve mean gradient measures 8.8 mmHg.   6. The inferior vena cava is normal in size with greater than 50%  respiratory variability, suggesting right atrial pressure of 3 mmHg.   Comparison(s): Prior images reviewed side by side. Changes from prior  study are noted. EF reduced compared to echo from 08/20/22, but is similar  to echo from 08/10/22 (pre-TAVR).   Conclusion(s)/Recommendation(s): In atrial fibrillation with RVR  throughout study.    EKG:  EKG is ordered today.  This shows rapid afib with HR 142. Mild Inferior ST depression.   Recent Labs: 08/09/2022: B Natriuretic Peptide 486.3; TSH 2.157 08/18/2022: ALT 15 08/20/2022: BUN 17; Creatinine, Ser 0.91; Hemoglobin 12.0; Magnesium 2.2; Platelets 219; Potassium 3.9; Sodium 137  Recent Lipid Panel    Component Value Date/Time   CHOL 215 (H) 08/10/2022 2136   CHOL 200 (H) 03/21/2013 1142   TRIG 49 08/10/2022 2136   TRIG 192 (H) 03/21/2013 1142   HDL 45 08/10/2022 2136   HDL 31 (L) 03/21/2013 1142   CHOLHDL 4.8 08/10/2022 2136   VLDL 10 08/10/2022 2136   LDLCALC 160 (H) 08/10/2022 2136   LDLCALC 131 (H) 03/21/2013 1142     Risk Assessment/Calculations:       Physical Exam:    VS:  BP 130/70   Pulse (!) 126   Ht 5' 10.5" (1.791 m)   Wt 152 lb (68.9 kg)   SpO2 98%   BMI 21.50 kg/m     Wt Readings from Last 3 Encounters:  09/24/22 152 lb (68.9 kg)  08/27/22 152 lb 9.6 oz (69.2 kg)  08/20/22 157 lb 6.5 oz (71.4 kg)     GEN:  Well nourished, well developed in no acute distress HEENT: Normal NECK: No JVD LYMPHATICS: No lymphadenopathy CARDIAC: irreg irreg, tachy.  no murmurs, rubs, gallops RESPIRATORY:  Clear to auscultation without rales, wheezing or rhonchi  ABDOMEN: Soft, non-tender, non-distended MUSCULOSKELETAL:  No edema; No deformity  SKIN: Warm and dry. NEUROLOGIC:  Alert and oriented x 3 PSYCHIATRIC:  Normal affect   ASSESSMENT:    1. S/P TAVR  (transcatheter aortic valve replacement)   2. Hyperlipidemia, unspecified hyperlipidemia type   3. CAD S/P percutaneous coronary angioplasty   4. SVT (supraventricular tachycardia)   5. Prostate enlargement   6. Atrial fibrillation with rapid ventricular response (HCC)     PLAN:    In order of problems listed above:  Severe AS s/p TAVR: echo today shows EF 40-45%, normally functioning TAVR with a mean gradient of 8.8 mmHg and no PVL. He has NYHA class I symptoms. He has amoxicillin for SBE prophylaxis. He has been on DAPT with Asprin and Plavix given recent atherectomy and overlapping DES. Given new onset afib, I will add Eliquis 5mg  BID now and plan to drop aspirin after 1 month. I will see him back in 1 year for valve clinic follow up and echo.   HLD: continue ezetimibe 10mg  daily. LDL 160. He is statin intolerant (has been tried on at least 3 by previous PCP). I referred him to our lipid clinic to consider PCSK9i but he has declined the referral.    CAD: underwent successful PCI with orbital atherectomy, PTCA, and placement of 3 overlapping drug-eluting stents from the proximal to mid LAD on 08/13/22. Continue DAPT with aspirin/plavix BB. Will plan to stop aspirin after 1  month of triple therapy given new onset afib. As above, statin intolerant.    SVT: continue BB.   Prostatomegaly: pre TAVR CT reported prostatomegaly with focal area of focal area of hyperenhancement of the left prostate, recommend correlation with PSA. PSA ordered and normal. Son said this is followed by PCP  New onset atrial fibrillation with RVR: this was noted in the office today. He is asymptomatic with this. He is currently on Toprol XL 25mg  daily. Will increase this to 25mg  BID. I have added Eliquis 5mg  BID (age 32, creat 0.91, weight 68kg). Recent Hg ~12. Given patient assistance forms as it looks like this might be quite expensive for him. Plan to continue triple therapy with Asprin, plavix and eliquis x 1 month  and then drop aspirin given recent atherectomy and overlapping DESx3. I will see him back next week to follow rhythm and rate. If he does not convert, I may discuss DCCV.    Medication Adjustments/Labs and Tests Ordered: Current medicines are reviewed at length with the patient today.  Concerns regarding medicines are outlined above.  Orders Placed This Encounter  Procedures   EKG 12-Lead   Meds ordered this encounter  Medications   metoprolol succinate (TOPROL XL) 25 MG 24 hr tablet    Sig: Take 1 tablet (25 mg total) by mouth 2 (two) times daily.    Dispense:  180 tablet    Refill:  3   apixaban (ELIQUIS) 5 MG TABS tablet    Sig: Take 1 tablet (5 mg total) by mouth 2 (two) times daily.    Dispense:  60 tablet    Refill:  11    Patient Instructions  Medication Instructions:  Your physician has recommended you make the following change in your medication:  START ELIQUIS 5 MG TWICE DAILY.  INCREASE TOPROL TO 25 MG TWICE DAILY  *If you need a refill on your cardiac medications before your next appointment, please call your pharmacy*   Lab Work: NONE If you have labs (blood work) drawn today and your tests are completely normal, you will receive your results only by: [place] (if you have MyChart) OR A paper copy in the mail If you have any lab test that is abnormal or we need to change your treatment, we will call you to review the results.   Testing/Procedures: NONE   Follow-Up: At [facility], you and your health needs are our priority.  As part of our continuing mission to provide you with exceptional heart care, we have created designated Provider Care Teams.  These Care Teams include your primary Cardiologist (physician) and Advanced Practice Providers (APPs -  Physician Assistants and Nurse Practitioners) who all work together to provide you with the care you need, when you need it.  We recommend signing up for the patient portal called "MyChart".   Sign up information is provided on this After Visit Summary.  MyChart is used to connect with patients for Virtual Visits (Telemedicine).  Patients are able to view lab/test results, encounter notes, upcoming appointments, etc.  Non-urgent messages can be sent to your provider as well.   To learn more about what you can do with MyChart, go to [email or web address].    Your next appointment:   KEEP SCHEDULED FOLLOW UP  Other Instructions Please check your blood pressure 1-2 times per day for 1 week and and bring readings to next appointment.    Important Information About Sugar         Signed,  [name], PA-C  09/25/2022 1:08 PM    [facility]

## 2022-09-24 ENCOUNTER — Ambulatory Visit (INDEPENDENT_AMBULATORY_CARE_PROVIDER_SITE_OTHER): Payer: Medicare Other | Admitting: Physician Assistant

## 2022-09-24 ENCOUNTER — Ambulatory Visit: Payer: Medicare Other | Attending: Cardiology

## 2022-09-24 VITALS — BP 130/70 | HR 126 | Ht 70.5 in | Wt 152.0 lb

## 2022-09-24 DIAGNOSIS — I2510 Atherosclerotic heart disease of native coronary artery without angina pectoris: Secondary | ICD-10-CM | POA: Insufficient documentation

## 2022-09-24 DIAGNOSIS — I4891 Unspecified atrial fibrillation: Secondary | ICD-10-CM | POA: Diagnosis not present

## 2022-09-24 DIAGNOSIS — Z952 Presence of prosthetic heart valve: Secondary | ICD-10-CM | POA: Diagnosis not present

## 2022-09-24 DIAGNOSIS — E785 Hyperlipidemia, unspecified: Secondary | ICD-10-CM

## 2022-09-24 DIAGNOSIS — I350 Nonrheumatic aortic (valve) stenosis: Secondary | ICD-10-CM | POA: Insufficient documentation

## 2022-09-24 DIAGNOSIS — Z9861 Coronary angioplasty status: Secondary | ICD-10-CM | POA: Insufficient documentation

## 2022-09-24 DIAGNOSIS — N400 Enlarged prostate without lower urinary tract symptoms: Secondary | ICD-10-CM | POA: Diagnosis not present

## 2022-09-24 DIAGNOSIS — I471 Supraventricular tachycardia, unspecified: Secondary | ICD-10-CM | POA: Insufficient documentation

## 2022-09-24 MED ORDER — APIXABAN 5 MG PO TABS: 5.0000 mg | ORAL_TABLET | Freq: Two times a day (BID) | ORAL | 11 refills | Status: DC

## 2022-09-24 MED ORDER — METOPROLOL SUCCINATE ER 25 MG PO TB24: 25.0000 mg | ORAL_TABLET | Freq: Two times a day (BID) | ORAL | 3 refills | Status: DC

## 2022-09-24 NOTE — Patient Instructions (Signed)
Medication Instructions:  Your physician has recommended you make the following change in your medication:  START ELIQUIS 5 MG TWICE DAILY.  INCREASE TOPROL TO 25 MG TWICE DAILY  *If you need a refill on your cardiac medications before your next appointment, please call your pharmacy*   Lab Work: NONE If you have labs (blood work) drawn today and your tests are completely normal, you will receive your results only by: [place] (if you have MyChart) OR A paper copy in the mail If you have any lab test that is abnormal or we need to change your treatment, we will call you to review the results.   Testing/Procedures: NONE   Follow-Up: At [facility] you and your health needs are our priority.  As part of our continuing mission to provide you with exceptional heart care, we have created designated Provider Care Teams.  These Care Teams include your primary Cardiologist (physician) and Advanced Practice Providers (APPs -  Physician Assistants and Nurse Practitioners) who all work together to provide you with the care you need, when you need it.  We recommend signing up for the patient portal called "MyChart".  Sign up information is provided on this After Visit Summary.  MyChart is used to connect with patients for Virtual Visits (Telemedicine).  Patients are able to view lab/test results, encounter notes, upcoming appointments, etc.  Non-urgent messages can be sent to your provider as well.   To learn more about what you can do with MyChart, go to [email or web address].    Your next appointment:   KEEP SCHEDULED FOLLOW UP  Other Instructions Please check your blood pressure 1-2 times per day for 1 week and and bring readings to next appointment.    Important Information About Sugar

## 2022-09-25 LAB — ECHOCARDIOGRAM COMPLETE
AR max vel: 1.8 cm2
AV Area VTI: 1.65 cm2
AV Area mean vel: 1.71 cm2
AV Mean grad: 8.8 mmHg
AV Peak grad: 15.8 mmHg
Ao pk vel: 1.99 m/s
Area-P 1/2: 4.27 cm2
S' Lateral: 3.2 cm

## 2022-09-26 ENCOUNTER — Telehealth: Payer: Self-pay | Admitting: Cardiology

## 2022-09-26 ENCOUNTER — Telehealth: Payer: Self-pay

## 2022-09-26 DIAGNOSIS — I4891 Unspecified atrial fibrillation: Secondary | ICD-10-CM

## 2022-09-26 MED ORDER — WARFARIN SODIUM 5 MG PO TABS: ORAL_TABLET | ORAL | 0 refills | Status: DC

## 2022-09-26 NOTE — Telephone Encounter (Addendum)
Spoke with patient who stated he is not comfortable starting eliquis. He prefers warfarin because of pricing and convenience.. Please advise of dosage, referral to coumadin clinic, and ASA.

## 2022-09-26 NOTE — Addendum Note (Signed)
Addended by: Derrel Nip B on: 09/26/2022 05:01 PM   Modules accepted: Orders

## 2022-09-26 NOTE — Telephone Encounter (Signed)
LMTCB

## 2022-09-26 NOTE — Telephone Encounter (Signed)
Thank you so much candance!!!

## 2022-09-26 NOTE — Telephone Encounter (Signed)
Pt c/o medication issue:  1. Name of Medication:  apixaban (ELIQUIS) 5 MG TABS tablet  2. How are you currently taking this medication (dosage and times per day)?   3. Are you having a reaction (difficulty breathing--STAT)?   4. What is your medication issue?   Patient states he would prefer to go on Warfarin/Coumadin instead of Eliquis because his insurance will not cover it. Patient also mentions concerns with bleeding out while taking Eliquis.

## 2022-09-26 NOTE — Telephone Encounter (Signed)
Patient informed he can take warfarin. Until then, he will continue with ASA and Plavix. He will stop ASA after staring warfarin. Patient verbalized understanding. He has not started taking the prescribed eliquis.

## 2022-09-26 NOTE — Telephone Encounter (Signed)
**Note De-identified  Obfuscation** ERROR

## 2022-09-26 NOTE — Telephone Encounter (Signed)
Called pt in reference to starting Warfarin. He stated he has heard about people having bleeding on eliquis and other things such as insurance issue with the cost and he did not want to take a chance with any issues.   Advised I will be sending in him a prescription for warfarin and making an appointment for follow up. Advised pt that warfarin requires weekly monitoring as a new pt and the first visit consist of education up to 30 minutes and he verbalized understanding.   Also, made pt aware that the warfarin 5mg  tablet is a peach color and he needs take around the same time daily; he will take in the evening and pick it up tomorrow. He is aware of the weekly checks to find his warfarin dose and he verbalized understanding. Pt lives in [place] and will be going to the [place] office for management since it is less than 14 miles away per patient. Gave him the address to the location & confirmed appt on 10/02/22 at 2pm. Sent in warfarin 5mg  tablet to his preferred pharmacy. He will call back (gave him our number) if anything is needed regarding the warfarin. He was thankful for the assistance.

## 2022-09-30 NOTE — Progress Notes (Unsigned)
[facility]   MULTIDISCIPLINARY HEART VALVE CLINIC                                     Cardiology Office Note:    Date:  09/30/2022   ID:  Jonathan Romero, DOB 03/01/1946, MRN [id number]  PCP:  Assunta Found, MD  [facility] Cardiologist:  Peter Swaziland, MD / Dr. Excell Seltzer and Dr. Leafy Ro (TAVR) [facility] Electrophysiologist:  None   Referring MD: Assunta Found, MD   Follow up new onset afib with RVR   History of Present Illness:    Jonathan Romero is a 77 y.o. 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   On 08/09/22 he had two episodes of exertional syncope episode. His wife called 911 and EMS found the patient in SVT with a reported HR of 280. He was then cardioverted into NSR. Upon arrival to the ED he was found to have a heart murmur and NSTEMI. Echo 08/10/22 revealed EF 40-45% with severe AS with a mean gradient of 45.0 mmHg, peak gradient of 67.64mmHg, and AVA 0.53 cm2. R/LHC was 08/11/22 showed a 95% calcified LAD lesion. He was evaluated by the multidisciplinary valve team and felt to not be a surgical candidate due to significant aortic calcification found on CTA. He ultimately underwent successful PCI with orbital atherectomy, PTCA, and placement of 3 overlapping drug-eluting stents from the proximal to mid LAD on 08/13/22. He was discharged home on 08/14/22 with plans to return on 08/19/22 for TAVR.  He underwent successful TAVR with a 26 mm Edwards Sapien 3 Ultra Resilia THV via the TF approach on 08/19/22. Post operative echo showed EF 55%, normally functioning TAVR with a mean gradient of 12 mmHg and no PVL. He was continued on DAPT.   He has done well in follow up with an improvement in his symptoms. He was seen on 09/24/22 for 1 month follow up. Echo showed EF 40-45%, normally functioning TAVR with a mean gradient of 8.8 mmHg and no  PVL. He was noted to be in rapid afib, which was asymptomatic. Toprol 25mg  daily increased to BID. Started on Elquis which was later converted to Coumadin due to cost issues. Aspirin discontinued.   Today the patient presents to clinic for follow up. Here is here alone.   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 Excell Seltzer & Weldner   Severe aortic stenosis     Past Surgical History:  Procedure Laterality Date   CATARACT EXTRACTION     CORONARY ATHERECTOMY N/A 08/13/2022   Procedure: CORONARY ATHERECTOMY;  Surgeon: Tonny Bollman, MD;  Location: [facility] INVASIVE CV LAB;  Service: Cardiovascular;  Laterality: N/A;   CORONARY STENT INTERVENTION N/A  08/13/2022   Procedure: CORONARY STENT INTERVENTION;  Surgeon: Tonny Bollman, MD;  Location: [facility] INVASIVE CV LAB;  Service: Cardiovascular;  Laterality: N/A;   DOPPLER ECHOCARDIOGRAPHY  12/21/2012   EF 55-60%,   DOPPLER ECHOCARDIOGRAPHY  12/16/2011   MILD AORTIC STENOSIS,PEAK AND MEAN GRADIENTS OF 18 AND 8 mmHg and a valve area of around 2 square cm.    INTRAOPERATIVE TRANSTHORACIC ECHOCARDIOGRAM N/A 08/19/2022   Procedure: INTRAOPERATIVE TRANSTHORACIC ECHOCARDIOGRAM;  Surgeon: Tonny Bollman, MD;  Location: [facility] OR;  Service: Open Heart Surgery;  Laterality: N/A;   RIGHT/LEFT HEART CATH AND CORONARY ANGIOGRAPHY N/A 08/11/2022   Procedure: RIGHT/LEFT HEART CATH AND CORONARY ANGIOGRAPHY;  Surgeon: Lyn Records, MD;  Location: [facility] INVASIVE CV  LAB;  Service: Cardiovascular;  Laterality: N/A;   TRANSCATHETER AORTIC VALVE REPLACEMENT, TRANSFEMORAL N/A 08/19/2022   Procedure: Transcatheter Aortic Valve Replacement, Transfemoral using a 26 MM Edwards SAPIEN 3 Ultra;  Surgeon: Tonny Bollman, MD;  Location: [facility] OR;  Service: Open Heart Surgery;  Laterality: N/A;  Transfemoral    Current Medications: No outpatient medications have been marked as taking for the 10/01/22 encounter (Appointment) with [facility].     Allergies:   Statins   Social History   Socioeconomic History   Marital status: Married    Spouse name: Jonathan Romero   Number of children: 3   Years of education: Not on file   Highest education level: High school graduate  Occupational History   Occupation: retired  Tobacco Use   Smoking status: Former    Types: Cigarettes    Quit date: 04/15/1983    Years since quitting: 39.4   Smokeless tobacco: Never  Vaping Use   Vaping Use: Never used  Substance and Sexual Activity   Alcohol use: Yes    Comment: beer occasionally   Drug use: No   Sexual activity: Not on file  Other Topics Concern   Not on file  Social History Narrative   Not on file   Social Determinants of Health   Financial Resource Strain: Low Risk  (08/13/2022)   Overall Financial Resource Strain (CARDIA)    Difficulty of Paying Living Expenses: Not hard at all  Food Insecurity: No Food Insecurity (08/10/2022)   Hunger Vital Sign    Worried About Running Out of Food in the Last Year: Never true    Ran Out of Food in the Last Year: Never true  Transportation Needs: No Transportation Needs (08/13/2022)   PRAPARE - Administrator, Civil Service (Medical): No    Lack of Transportation (Non-Medical): No  Physical Activity: Not on file  Stress: Not on file  Social Connections: Not on file     Family History: The patient's family history includes Diabetes in his mother; Heart failure in his father.  ROS:   Please  see the history of present illness.    All other systems reviewed and are negative.  EKGs/Labs/Other Studies Reviewed:    The following studies were reviewed today:  TAVR OPERATIVE NOTE     Date of Procedure:                08/19/2022   Preoperative Diagnosis:      Severe Aortic Stenosis    Postoperative Diagnosis:    Same    Procedure:        Transcatheter Aortic Valve Replacement - Percutaneous  Transfemoral Approach             Edwards Sapien 3 Ultra Resilia THV (  size 26 mm, serial #[id number])              Co-Surgeons:                        Eugenio Hoes, MD and Tonny Bollman, MD   Anesthesiologist:                  Eilene Ghazi, MD   Echocardiographer:              Thurmon Fair, MD   Pre-operative Echo Findings: Severe aortic stenosis Normal left ventricular systolic function   Post-operative Echo Findings: No paravalvular leak Normal/unchanged left ventricular systolic function   ___________________________     Echo 08/20/22:  IMPRESSIONS  1. Left ventricular ejection fraction, by estimation, is 55 to 60%. The  left ventricle has normal function. The left ventricle demonstrates  regional wall motion abnormalities (see scoring diagram/findings for  description). Left ventricular diastolic  parameters are consistent with Grade II diastolic dysfunction  (pseudonormalization). Elevated left atrial pressure. There is moderate  hypokinesis of the left ventricular, basal-mid inferolateral wall.   2. Right ventricular systolic function is normal. The right ventricular  size is normal. There is mildly elevated pulmonary artery systolic  pressure.   3. Left atrial size was moderately dilated.   4. The mitral valve is normal in structure. Trivial mitral valve  regurgitation. Moderate mitral annular calcification.   5. The aortic valve has been repaired/replaced. Aortic valve  regurgitation is not visualized. There is a valve present in the aortic  position. Echo  findings are consistent with normal structure and function  of the aortic valve prosthesis. Aortic valve  mean gradient measures 12.0 mmHg. Aortic valve Vmax measures 2.39 m/s.  Aortic valve acceleration time measures 85 msec.   6. The inferior vena cava is dilated in size with <50% respiratory  variability, suggesting right atrial pressure of 15 mmHg.   Comparison(s): Prior images reviewed side by side. The left ventricular  function has improved.   ______________________  Echo 09/24/22 IMPRESSIONS   1. Left ventricular ejection fraction, by estimation, is 40 to 45%. The  left ventricle has mildly decreased function. The left ventricle  demonstrates global hypokinesis. Left ventricular diastolic function could  not be evaluated.   2. Right ventricular systolic function is normal. The right ventricular  size is normal. There is normal pulmonary artery systolic pressure.   3. Left atrial size was moderately dilated.   4. The mitral valve is normal in structure. Trivial mitral valve  regurgitation. No evidence of mitral stenosis.   5. The aortic valve has been repaired/replaced. Aortic valve  regurgitation is not visualized. There is a 26 mm Edwards Sapien  prosthetic (TAVR) valve present in the aortic position. Echo findings are  consistent with normal structure and function of the  aortic valve prosthesis. Aortic valve area, by VTI measures 1.65 cm.  Aortic valve mean gradient measures 8.8 mmHg.   6. The inferior vena cava is normal in size with greater than 50%  respiratory variability, suggesting right atrial pressure of 3 mmHg.   Comparison(s): Prior images reviewed side by side. Changes from prior  study are noted. EF reduced compared to echo from 08/20/22, but is similar  to echo from 08/10/22 (pre-TAVR).   Conclusion(s)/Recommendation(s): In atrial fibrillation with RVR  throughout study.    EKG:  EKG is ordered today.  This shows rapid afib with HR 142. Mild Inferior ST  depression.  Recent Labs: 08/09/2022: B Natriuretic Peptide 486.3; TSH 2.157 08/18/2022: ALT 15 08/20/2022: BUN 17; Creatinine, Ser 0.91; Hemoglobin 12.0; Magnesium 2.2; Platelets 219; Potassium 3.9; Sodium 137  Recent Lipid Panel    Component Value Date/Time   CHOL 215 (H) 08/10/2022 2136   CHOL 200 (H) 03/21/2013 1142   TRIG 49 08/10/2022 2136   TRIG 192 (H) 03/21/2013 1142   HDL 45 08/10/2022 2136   HDL 31 (L) 03/21/2013 1142   CHOLHDL 4.8 08/10/2022 2136   VLDL 10 08/10/2022 2136   LDLCALC 160 (H) 08/10/2022 2136   LDLCALC 131 (H) 03/21/2013 1142     Risk Assessment/Calculations:       Physical Exam:    VS:  There were no vitals taken for this visit.    Wt Readings from Last 3 Encounters:  09/24/22 152 lb (68.9 kg)  08/27/22 152 lb 9.6 oz (69.2 kg)  08/20/22 157 lb 6.5 oz (71.4 kg)     GEN:  Well nourished, well developed in no acute distress HEENT: Normal NECK: No JVD LYMPHATICS: No lymphadenopathy CARDIAC: irreg irreg, tachy.  no murmurs, rubs, gallops RESPIRATORY:  Clear to auscultation without rales, wheezing or rhonchi  ABDOMEN: Soft, non-tender, non-distended MUSCULOSKELETAL:  No edema; No deformity  SKIN: Warm and dry. NEUROLOGIC:  Alert and oriented x 3 PSYCHIATRIC:  Normal affect   ASSESSMENT:    1. New onset atrial fibrillation (HCC)   2. S/P TAVR (transcatheter aortic valve replacement)   3. Hyperlipidemia, unspecified hyperlipidemia type   4. CAD S/P percutaneous coronary angioplasty   5. SVT (supraventricular tachycardia)      PLAN:    In order of problems listed above:  New onset atrial fibrillation with RVR:   Severe AS s/p TAVR: valve stable.   HLD: continue ezetimibe 10mg  daily. LDL 160. He is statin intolerant (has been tried on at least 3 by previous PCP). I referred him to our lipid clinic to consider PCSK9i but he has declined the referral.    CAD: underwent successful PCI with orbital atherectomy, PTCA, and placement of 3  overlapping drug-eluting stents from the proximal to mid LAD on 08/13/22. Continue Plavix and Coumadin. As above, statin intolerant.    SVT: continue BB.    Medication Adjustments/Labs and Tests Ordered: Current medicines are reviewed at length with the patient today.  Concerns regarding medicines are outlined above.  No orders of the defined types were placed in this encounter.  No orders of the defined types were placed in this encounter.   There are no Patient Instructions on file for this visit.   Signed, Cline Crock, PA-C  09/30/2022 1:25 PM    [facility]

## 2022-09-30 NOTE — H&P (View-Only) (Signed)
Jonathan Romero                                     Cardiology Office Note:    Date:  10/01/2022   ID:  KYMON Romero, DOB [birth year], MRN [id number]  PCP:  Jonathan Sites, MD  [facility] Cardiologist:  Jonathan Martinique, MD / Dr. Burt Romero and Dr. Lavonna Romero (TAVR) [facility] Electrophysiologist:  None   Referring MD: Jonathan Sites, MD   Follow up new onset afib with RVR   History of Present Illness:    Jonathan Romero is a 77 y.o. 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   On 08/09/22 he had two episodes of exertional syncope episode. His wife called 25 and EMS found the patient in SVT with a reported HR of 280. He was then cardioverted into NSR. Upon arrival to the ED he was found to have a heart murmur and NSTEMI. Echo 08/10/22 revealed EF 40-45% with severe AS with a mean gradient of 45.0 mmHg, peak gradient of 67.82mmHg, and AVA 0.53 cm2. R/LHC was 08/11/22 showed a 95% calcified LAD lesion. He was evaluated by the multidisciplinary valve team and felt to not be a surgical candidate due to significant aortic calcification found on CTA. He ultimately underwent successful PCI with orbital atherectomy, PTCA, and placement of 3 overlapping drug-eluting stents from the proximal to mid LAD on 08/13/22. He was discharged home on 08/14/22 with plans to return on 08/19/22 for TAVR.  He underwent successful TAVR with a 26 mm Jonathan Sapien 3 Ultra Resilia THV via the TF approach on 08/19/22. Post operative echo showed EF 55%, normally functioning TAVR with a mean gradient of 12 mmHg and no PVL. He was continued on DAPT.   He has done well in follow up with an improvement in his symptoms. He was seen on 09/24/22 for 1 month follow up. Echo showed EF 40-45%, normally functioning TAVR with a mean gradient of 8.8 mmHg and no  PVL. He was noted to be in rapid afib, which was asymptomatic. Toprol 25mg  daily increased to BID. Started on Elquis which was later converted to Coumadin due to cost issues. Aspirin discontinued.   Today the patient presents to clinic for follow up. Here is here with son. He continues to feel quite well with no issues and no symptoms related to afib. No CP or SOB. No LE edema, orthopnea or PND. No dizziness or syncope. No blood in stool or urine. No palpitations.    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 Burt Romero & Jonathan Romero   Severe aortic stenosis     Past Surgical History:  Procedure Laterality Date  CATARACT EXTRACTION     CORONARY ATHERECTOMY N/A 08/13/2022   Procedure: CORONARY ATHERECTOMY;  Surgeon: Jonathan Mocha, MD;  Location: [place] [facility];  Service: Cardiovascular;  Laterality: N/A;   CORONARY STENT INTERVENTION N/A 08/13/2022   Procedure: CORONARY STENT INTERVENTION;  Surgeon: Jonathan Mocha, MD;  Location: [place] [facility];  Service: Cardiovascular;  Laterality: N/A;   DOPPLER ECHOCARDIOGRAPHY  12/21/2012   EF 55-60%,   DOPPLER ECHOCARDIOGRAPHY  12/16/2011   MILD AORTIC STENOSIS,PEAK AND MEAN GRADIENTS OF 18 AND 8 mmHg and a valve area of around 2 square cm.    INTRAOPERATIVE TRANSTHORACIC ECHOCARDIOGRAM N/A 08/19/2022   Procedure: INTRAOPERATIVE TRANSTHORACIC ECHOCARDIOGRAM;  Surgeon: Jonathan Mocha, MD;  Location: [place];  Service: Open Heart Surgery;   Laterality: N/A;   RIGHT/LEFT HEART CATH AND CORONARY ANGIOGRAPHY N/A 08/11/2022   Procedure: RIGHT/LEFT HEART CATH AND CORONARY ANGIOGRAPHY;  Surgeon: Jonathan Crome, MD;  Location: [place] [facility];  Service: Cardiovascular;  Laterality: N/A;   TRANSCATHETER AORTIC VALVE REPLACEMENT, TRANSFEMORAL N/A 08/19/2022   Procedure: Transcatheter Aortic Valve Replacement, Transfemoral using a 26 MM Jonathan SAPIEN 3 Ultra;  Surgeon: Jonathan Mocha, MD;  Location: [place];  Service: Open Heart Surgery;  Laterality: N/A;  Transfemoral    Current Medications: Current Meds  Medication Sig   amoxicillin (AMOXIL) 500 MG tablet Take 4 tablets (2,000 mg total) by mouth as directed. 1 hour prior to dental work including cleanings   clopidogrel (PLAVIX) 75 MG tablet Take 1 tablet (75 mg total) by mouth daily.   ezetimibe (ZETIA) 10 MG tablet Take 1 tablet (10 mg total) by mouth daily.   metoprolol succinate (TOPROL-XL) 50 MG 24 hr tablet Take 1 tablet (50 mg total) by mouth 2 (two) times daily. Take with or immediately following a meal.   nitroGLYCERIN (NITROSTAT) 0.4 MG SL tablet Place 1 tablet (0.4 mg total) under the tongue every 5 (five) minutes x 3 doses as needed for chest pain.   warfarin (COUMADIN) 5 MG tablet Take 1 tablet by mouth daily or as directed by Anticoagulation Clinic.   [DISCONTINUED] metoprolol succinate (TOPROL XL) 25 MG 24 hr tablet Take 1 tablet (25 mg total) by mouth 2 (two) times daily.     Allergies:   Statins   Social History   Socioeconomic History   Marital status: Married    Spouse name: Henrita   Number of children: 3   Years of education: Not on file   Highest education level: High school graduate  Occupational History   Occupation: retired  Tobacco Use   Smoking status: Former    Types: Cigarettes    Quit date: 04/15/1983    Years since quitting: 39.4   Smokeless tobacco: Never  Vaping Use   Vaping Use: Never used  Substance and Sexual Activity   Alcohol use:  Yes    Comment: beer occasionally   Drug use: No   Sexual activity: Not on file  Other Topics Concern   Not on file  Social History Narrative   Not on file   Social Determinants of Health   Financial Resource Strain: Low Risk  (08/13/2022)   Overall Financial Resource Strain (CARDIA)    Difficulty of Paying Living Expenses: Not hard at all  Food Insecurity: No Food Insecurity (08/10/2022)   Hunger Vital Sign    Worried About Running Out of Food in the Last Year: Never true    [place] in the Last Year: Never true  Transportation  Needs: No Transportation Needs (08/13/2022)   PRAPARE - Hydrologist (Medical): No    Lack of Transportation (Non-Medical): No  Physical Activity: Not on file  Stress: Not on file  Social Connections: Not on file     Family History: The patient's family history includes Diabetes in his mother; Heart failure in his father.  ROS:   Please see the history of present illness.    All other systems reviewed and are negative.  EKGs/Labs/Other Studies Reviewed:    The following studies were reviewed today:  TAVR OPERATIVE NOTE     Date of Procedure:                08/19/2022   Preoperative Diagnosis:      Severe Aortic Stenosis    Postoperative Diagnosis:    Same    Procedure:        Transcatheter Aortic Valve Replacement - Percutaneous  Transfemoral Approach             Jonathan Sapien 3 Ultra Resilia THV (size 26 mm, serial [id number])              Co-Surgeons:                        Coralie Common, MD and Jonathan Mocha, MD   Anesthesiologist:                  Myrtie Soman, MD   Echocardiographer:              Sanda Klein, MD   Pre-operative Echo Findings: Severe aortic stenosis Normal left ventricular systolic function   Post-operative Echo Findings: No paravalvular leak Normal/unchanged left ventricular systolic function   ___________________________     Echo 08/20/22:  IMPRESSIONS  1. Left  ventricular ejection fraction, by estimation, is 55 to 60%. The  left ventricle has normal function. The left ventricle demonstrates  regional wall motion abnormalities (see scoring diagram/findings for  description). Left ventricular diastolic  parameters are consistent with Grade II diastolic dysfunction  (pseudonormalization). Elevated left atrial pressure. There is moderate  hypokinesis of the left ventricular, basal-mid inferolateral wall.   2. Right ventricular systolic function is normal. The right ventricular  size is normal. There is mildly elevated pulmonary artery systolic  pressure.   3. Left atrial size was moderately dilated.   4. The mitral valve is normal in structure. Trivial mitral valve  regurgitation. Moderate mitral annular calcification.   5. The aortic valve has been repaired/replaced. Aortic valve  regurgitation is not visualized. There is a valve present in the aortic  position. Echo findings are consistent with normal structure and function  of the aortic valve prosthesis. Aortic valve  mean gradient measures 12.0 mmHg. Aortic valve Vmax measures 2.39 m/s.  Aortic valve acceleration time measures 85 msec.   6. The inferior vena cava is dilated in size with <50% respiratory  variability, suggesting right atrial pressure of 15 mmHg.   Comparison(s): Prior images reviewed side by side. The left ventricular  function has improved.   ______________________  Echo 09/24/22 IMPRESSIONS   1. Left ventricular ejection fraction, by estimation, is 40 to 45%. The  left ventricle has mildly decreased function. The left ventricle  demonstrates global hypokinesis. Left ventricular diastolic function could  not be evaluated.   2. Right ventricular systolic function is normal. The right ventricular  size is normal. There is normal pulmonary artery systolic pressure.  3. Left atrial size was moderately dilated.   4. The mitral valve is normal in structure. Trivial mitral  valve  regurgitation. No evidence of mitral stenosis.   5. The aortic valve has been repaired/replaced. Aortic valve  regurgitation is not visualized. There is a 26 mm Jonathan Sapien  prosthetic (TAVR) valve present in the aortic position. Echo findings are  consistent with normal structure and function of the  aortic valve prosthesis. Aortic valve area, by VTI measures 1.65 cm.  Aortic valve mean gradient measures 8.8 mmHg.   6. The inferior vena cava is normal in size with greater than 50%  respiratory variability, suggesting right atrial pressure of 3 mmHg.   Comparison(s): Prior images reviewed side by side. Changes from prior  study are noted. EF reduced compared to echo from 08/20/22, but is similar  to echo from 08/10/22 (pre-TAVR).   Conclusion(s)/Recommendation(s): In atrial fibrillation with RVR  throughout study.    EKG:  EKG is ordered today.  This shows rapid afib with HR 130.   Recent Labs: 08/09/2022: B Natriuretic Peptide 486.3; TSH 2.157 08/18/2022: ALT 15 08/20/2022: BUN 17; Creatinine, Ser 0.91; Hemoglobin 12.0; Magnesium 2.2; Platelets 219; Potassium 3.9; Sodium 137  Recent Lipid Panel    Component Value Date/Time   CHOL 215 (H) 08/10/2022 2136   CHOL 200 (H) 03/21/2013 1142   TRIG 49 08/10/2022 2136   TRIG 192 (H) 03/21/2013 1142   HDL 45 08/10/2022 2136   HDL 31 (L) 03/21/2013 1142   CHOLHDL 4.8 08/10/2022 2136   VLDL 10 08/10/2022 2136   LDLCALC 160 (H) 08/10/2022 2136   LDLCALC 131 (H) 03/21/2013 1142     Risk Assessment/Calculations:       Physical Exam:    VS:  BP 128/74   Pulse (!) 130   Ht 5\' 10"  (1.778 m)   Wt 149 lb 6.4 oz (67.8 kg)   SpO2 98%   BMI 21.44 kg/m     Wt Readings from Last 3 Encounters:  10/01/22 149 lb 6.4 oz (67.8 kg)  09/24/22 152 lb (68.9 kg)  08/27/22 152 lb 9.6 oz (69.2 kg)     GEN:  Well nourished, well developed in no acute distress HEENT: Normal NECK: No JVD LYMPHATICS: No lymphadenopathy CARDIAC:  irreg irreg, tachy.  no murmurs, rubs, gallops RESPIRATORY:  Clear to auscultation without rales, wheezing or rhonchi  ABDOMEN: Soft, non-tender, non-distended MUSCULOSKELETAL:  No edema; No deformity  SKIN: Warm and dry. NEUROLOGIC:  Alert and oriented x 3 PSYCHIATRIC:  Normal affect   ASSESSMENT:    1. New onset atrial fibrillation ([place])   2. S/P TAVR (transcatheter aortic valve replacement)   3. Hyperlipidemia, unspecified hyperlipidemia type   4. CAD S/P percutaneous coronary angioplasty   5. SVT (supraventricular tachycardia)      PLAN:    In order of problems listed above:  New onset atrial fibrillation with RVR: he remains in afib with RVR today. Will increase Toprol xl 25mg  BID to 50mg  BID and set him up for TEE guided DCCV next available which is 1/26 with Dr. Harl Bowie. Plan to follow up in afib clinic after cardioversion. He has an INR check tomorrow. CBC and BMET today  After careful review of history and examination, the risks and benefits of transesophageal echocardiogram have been explained including risks of esophageal damage, perforation (1:10,000 risk), bleeding, pharyngeal hematoma as well as other potential complications associated with conscious sedation including aspiration, arrhythmia, respiratory failure and death. Alternatives to treatment were  discussed, questions were answered. Patient is willing to proceed.   Severe AS s/p TAVR: valve stable.   HLD: continue ezetimibe 10mg  daily. LDL 160. He is statin intolerant (has been tried on at least 3 by previous PCP). I referred him to our lipid clinic to consider PCSK9i but he has declined the referral.    CAD: underwent successful PCI with orbital atherectomy, PTCA, and placement of 3 overlapping drug-eluting stents from the proximal to mid LAD on 08/13/22. Continue Plavix and Coumadin. As above, statin intolerant.    SVT: continue BB.    Medication Adjustments/Labs and Tests Ordered: Current medicines are  reviewed at length with the patient today.  Concerns regarding medicines are outlined above.  Orders Placed This Encounter  Procedures   Basic metabolic panel   CBC   Amb Referral to AFIB Clinic   EKG 12-Lead   Meds ordered this encounter  Medications   metoprolol succinate (TOPROL-XL) 50 MG 24 hr tablet    Sig: Take 1 tablet (50 mg total) by mouth 2 (two) times daily. Take with or immediately following a meal.    Dispense:  180 tablet    Refill:  3    Class fill later as patient does not need at this time    Patient Instructions  Medication Instructions:  1.Increase metoprolol succinate (Toprol XL) to 50 mg twice a day *If you need a refill on your cardiac medications before your next appointment, please call your pharmacy*   Lab Work: BMET and CBC today If you have labs (blood work) drawn today and your tests are completely normal, you will receive your results only by: [place] (if you have MyChart) OR A paper copy in the mail If you have any lab test that is abnormal or we need to change your treatment, we will call you to review the results.   Testing/Procedures:    Dear Cay Schillings  You are scheduled for a TEE (Transesophageal Echocardiogram) Guided Cardioversion on Friday, January 26 with Dr. Phineas Inches.  Please arrive at the [facility] (Main Entrance A) at [facility]: [address] [place], [place] [address] at 7:30 AM.   DIET:  Nothing to eat or drink after midnight except a sip of water with medications (see medication instructions below)  MEDICATION INSTRUCTIONS: Continue taking your anticoagulant (blood thinner): Warfarin (Coumadin).  You will need to continue this after your procedure until you are told by your provider that it is safe to stop.    LABS: BMET and CBC to be done today per [name] PA-C Schedule PT/INR to be done 1 week prior to TEE/Cardioversion  FYI:  For your safety, and to allow [name] to monitor your vital  signs accurately during the surgery/procedure we request: If you have artificial nails, gel coating, SNS etc, please have those removed prior to your surgery/procedure. Not having the nail coverings /polish removed may result in cancellation or delay of your surgery/procedure.  You must have a responsible person to drive you home and stay in the waiting area during your procedure. Failure to do so could result in cancellation.  Bring your insurance cards.  *Special Note: Every effort is made to have your procedure done on time. Occasionally there are emergencies that occur at the hospital that may cause delays. Please be patient if a delay does occur.     Follow-Up: At [facility], you and your health needs are our priority.  As part of our continuing mission  to provide you with exceptional heart care, we have created designated Provider Care Teams.  These Care Teams include your primary Cardiologist (physician) and Advanced Practice Providers (APPs -  Physician Assistants and Nurse Practitioners) who all work together to provide you with the care you need, when you need it.  Keep 5/9//2024 appointment with Dr [place] as scheduled.  Other Instructions: You have been referred to the A-fib clinic. They will be in contact with you to schedule an appointment.   Important Information About Sugar         Signed, Cline Crock, PA-C  10/01/2022 2:39 PM    [facility]

## 2022-10-01 ENCOUNTER — Other Ambulatory Visit: Payer: Self-pay | Admitting: Physician Assistant

## 2022-10-01 ENCOUNTER — Ambulatory Visit: Payer: Medicare Other | Attending: Physician Assistant | Admitting: Physician Assistant

## 2022-10-01 VITALS — BP 128/74 | HR 130 | Ht 70.0 in | Wt 149.4 lb

## 2022-10-01 DIAGNOSIS — I471 Supraventricular tachycardia, unspecified: Secondary | ICD-10-CM | POA: Diagnosis not present

## 2022-10-01 DIAGNOSIS — E785 Hyperlipidemia, unspecified: Secondary | ICD-10-CM | POA: Insufficient documentation

## 2022-10-01 DIAGNOSIS — Z952 Presence of prosthetic heart valve: Secondary | ICD-10-CM | POA: Diagnosis not present

## 2022-10-01 DIAGNOSIS — Z9861 Coronary angioplasty status: Secondary | ICD-10-CM | POA: Insufficient documentation

## 2022-10-01 DIAGNOSIS — I4891 Unspecified atrial fibrillation: Secondary | ICD-10-CM | POA: Insufficient documentation

## 2022-10-01 DIAGNOSIS — I2510 Atherosclerotic heart disease of native coronary artery without angina pectoris: Secondary | ICD-10-CM | POA: Insufficient documentation

## 2022-10-01 MED ORDER — METOPROLOL SUCCINATE ER 50 MG PO TB24: 50.0000 mg | ORAL_TABLET | Freq: Two times a day (BID) | ORAL | 3 refills | Status: DC

## 2022-10-01 NOTE — Patient Instructions (Addendum)
Medication Instructions:  1.Increase metoprolol succinate (Toprol XL) to 50 mg twice a day *If you need a refill on your cardiac medications before your next appointment, please call your pharmacy*   Lab Work: BMET and CBC today If you have labs (blood work) drawn today and your tests are completely normal, you will receive your results only by: [place] (if you have MyChart) OR A paper copy in the mail If you have any lab test that is abnormal or we need to change your treatment, we will call you to review the results.   Testing/Procedures:    Dear Jonathan Romero  You are scheduled for a TEE (Transesophageal Echocardiogram) Guided Cardioversion on Friday, January 26 with Dr. Phineas Inches.  Please arrive at the [facility] (Main Entrance A) at [facility]: [address] [place], [place] [address] at 7:30 AM.   DIET:  Nothing to eat or drink after midnight except a sip of water with medications (see medication instructions below)  MEDICATION INSTRUCTIONS: Continue taking your anticoagulant (blood thinner): Warfarin (Coumadin).  You will need to continue this after your procedure until you are told by your provider that it is safe to stop.    LABS: BMET and CBC to be done today per [name] PA-C Schedule PT/INR to be done 1 week prior to TEE/Cardioversion  FYI:  For your safety, and to allow [name] to monitor your vital signs accurately during the surgery/procedure we request: If you have artificial nails, gel coating, SNS etc, please have those removed prior to your surgery/procedure. Not having the nail coverings /polish removed may result in cancellation or delay of your surgery/procedure.  You must have a responsible person to drive you home and stay in the waiting area during your procedure. Failure to do so could result in cancellation.  Bring your insurance cards.  *Special Note: Every effort is made to have your procedure done on time. Occasionally there  are emergencies that occur at the hospital that may cause delays. Please be patient if a delay does occur.     Follow-Up: At [facility], you and your health needs are our priority.  As part of our continuing mission to provide you with exceptional heart care, we have created designated Provider Care Teams.  These Care Teams include your primary Cardiologist (physician) and Advanced Practice Providers (APPs -  Physician Assistants and Nurse Practitioners) who all work together to provide you with the care you need, when you need it.  Keep 5/9//2024 appointment with Dr Martinique as scheduled.  Other Instructions: You have been referred to the A-fib clinic. They will be in contact with you to schedule an appointment.   Important Information About Sugar

## 2022-10-02 ENCOUNTER — Ambulatory Visit: Payer: Medicare Other | Attending: Cardiology | Admitting: *Deleted

## 2022-10-02 DIAGNOSIS — Z952 Presence of prosthetic heart valve: Secondary | ICD-10-CM | POA: Diagnosis not present

## 2022-10-02 DIAGNOSIS — I4891 Unspecified atrial fibrillation: Secondary | ICD-10-CM | POA: Diagnosis not present

## 2022-10-02 DIAGNOSIS — Z5181 Encounter for therapeutic drug level monitoring: Secondary | ICD-10-CM

## 2022-10-02 LAB — CBC
Hematocrit: 41.6 % (ref 37.5–51.0)
Hemoglobin: 13.7 g/dL (ref 13.0–17.7)
MCH: 32.9 pg (ref 26.6–33.0)
MCHC: 32.9 g/dL (ref 31.5–35.7)
MCV: 100 fL — ABNORMAL HIGH (ref 79–97)
Platelets: 170 10*3/uL (ref 150–450)
RBC: 4.17 x10E6/uL (ref 4.14–5.80)
RDW: 12.2 % (ref 11.6–15.4)
WBC: 7 10*3/uL (ref 3.4–10.8)

## 2022-10-02 LAB — BASIC METABOLIC PANEL
BUN/Creatinine Ratio: 29 — ABNORMAL HIGH (ref 10–24)
BUN: 29 mg/dL — ABNORMAL HIGH (ref 8–27)
CO2: 24 mmol/L (ref 20–29)
Calcium: 9 mg/dL (ref 8.6–10.2)
Chloride: 104 mmol/L (ref 96–106)
Creatinine, Ser: 0.99 mg/dL (ref 0.76–1.27)
Glucose: 92 mg/dL (ref 70–99)
Potassium: 4.6 mmol/L (ref 3.5–5.2)
Sodium: 139 mmol/L (ref 134–144)
eGFR: 78 mL/min/{1.73_m2} (ref >59)

## 2022-10-02 LAB — POCT INR: INR: 1.9 — AB (ref 2.0–3.0)

## 2022-10-02 NOTE — Patient Instructions (Signed)
Started warfarin 5mg  daily on 09/27/21 Pending TEE/DCCV on 10/17/21 Increase warfarin to 1 tablet daily except 1 1/2 tablets on Sundays and Thursdays Recheck in 1 wk

## 2022-10-06 ENCOUNTER — Encounter: Payer: Self-pay | Admitting: *Deleted

## 2022-10-06 ENCOUNTER — Telehealth: Payer: Self-pay | Admitting: Physician Assistant

## 2022-10-06 MED ORDER — FUROSEMIDE 20 MG PO TABS: 20.0000 mg | ORAL_TABLET | Freq: Every day | ORAL | 6 refills | Status: DC

## 2022-10-06 NOTE — Telephone Encounter (Signed)
Pt c/o swelling: STAT is pt has developed SOB within 24 hours  If swelling, where is the swelling located? No swelling anywhere  How much weight have you gained and in what time span? 3 lbs in in two days.   Have you gained 3 pounds in a day or 5 pounds in a week? no  Do you have a log of your daily weights (if so, list)? 1/13 144  1/14 145.8  1/15 147     Are you currently taking a fluid pill? no  Are you currently SOB? No.  However he has noticed that when he walks to his mailbox lately that it's a little bit harder to breath, the mailbox is a walk from his house.    Have you traveled recently? No  Patient states he was recently switched over to coumadin he is wondering if that could have caused the weight gain. He said he checked his oxygen level and that was 98%.

## 2022-10-06 NOTE — Telephone Encounter (Signed)
Called and spoke with pt about rescheduling INR appt for 10/08/21 since TEE/DCCV has been moved up to 10/19/22.  He will discuss times with son and call me back.   Appt has been made for 10/08/22 at 3:15pm

## 2022-10-06 NOTE — Telephone Encounter (Signed)
    Eileen Stanford, PA-C  to Me  [facility] [address] Triage     10/06/22 11:15 AM Also Fraser Din, it looks like his INR check needs to be moved. Not sure why he needs the INR if he's getting a TEE. He was just started on coumadin, but that's what I was told by scheduling.  Thanks! KT   Eileen Stanford, PA-C  to Me  Cv Div [address] Triage  Deputy, Jonathan Romero, [place]  Jonathan Romero, [place]     10/06/22 11:13 AM I think his afib with RVR is causing heart failure. I think we should start him on lasix 20mg  daily. His last BP was okay but previous reported readings look quite low. I wonder if they are accurate. Also HRs look to be stable on log, but sometimes they can be inaccurate in the setting of rapid afib.  If he continues to have SBP <90s I would like him to come in to be seen in ER. If they are stable, plan to start Lasix 20mg  daily now. I moved his TEE/DCCV from 1/26 up to 1/18 @ 1pm, with Dr. Harrington Challenger. Can you call the pt and let him know the new date and time ?  He needs to be there 1 hour before and keep same other instructions.  He has a follow up with afib clinic on 2/12 with Ricky. Will add Rushie Goltz to see if this can be moved up a bit with me going out on maternity leave! He will need a BMET at that visit.  Thanks! KT

## 2022-10-06 NOTE — Telephone Encounter (Signed)
I spoke with patient and gave him instructions from K. Grandville Silos, [place].  Son also present. Will send message to [facility] coumadin clinic to reschedule appointment.  Patient aware to take lasix only if BP greater than 90.  If BP running less than 90 he is aware to go to ED.  Current BP is 118/76 and heart rate is 82. Prescription sent to [facility] in [place].

## 2022-10-06 NOTE — Telephone Encounter (Signed)
I spoke with patient. He weighs daily. Recent weights noted below.  Prior readings- 1/12-144.2 1/11-144.4 1/10-144.2 1/9-144 1/8-142.1  No swelling. He has noticed shortness of breath when climbing stairs and walking to the mailbox the last 2 days.  Improves when he rests.    He reports the following BP/heart rate readings-  1/11-101/65, 92 1/12-85/57,90 1/13-99/65, 103          67/46          87/60,71 1/14-98/60,74          86/52, 90          75/52, 81 Other than shortness of breath he is not having any other symptoms  He checked BP while on the phone with me and it is 139/98, heart rate 96.  This is after he has been walking around the house.  He states it will go down later. He takes Toprol at 2 PM and around 10 PM.  Will forward to K. Grandville Silos, [place] for review/recommendations.

## 2022-10-08 ENCOUNTER — Ambulatory Visit: Payer: Medicare Other | Attending: Cardiology | Admitting: *Deleted

## 2022-10-08 DIAGNOSIS — Z952 Presence of prosthetic heart valve: Secondary | ICD-10-CM | POA: Diagnosis not present

## 2022-10-08 DIAGNOSIS — I4891 Unspecified atrial fibrillation: Secondary | ICD-10-CM | POA: Diagnosis not present

## 2022-10-08 DIAGNOSIS — Z5181 Encounter for therapeutic drug level monitoring: Secondary | ICD-10-CM

## 2022-10-08 LAB — POCT INR: INR: 5.5 — AB (ref 2.0–3.0)

## 2022-10-08 NOTE — Patient Instructions (Addendum)
Started warfarin 5mg  daily on 09/27/21 Pending TEE/DCCV on 10/09/21 Hold warfarin tomorrow then decrease dose to 1 tablet daily  Eat greens tonight. Recheck in 1 wk Message sent to [facility] with INR results.

## 2022-10-09 ENCOUNTER — Ambulatory Visit (HOSPITAL_BASED_OUTPATIENT_CLINIC_OR_DEPARTMENT_OTHER): Payer: Medicare Other | Admitting: Certified Registered"

## 2022-10-09 ENCOUNTER — Other Ambulatory Visit: Payer: Self-pay

## 2022-10-09 ENCOUNTER — Other Ambulatory Visit (HOSPITAL_COMMUNITY): Payer: Self-pay | Admitting: Internal Medicine

## 2022-10-09 ENCOUNTER — Ambulatory Visit (HOSPITAL_BASED_OUTPATIENT_CLINIC_OR_DEPARTMENT_OTHER): Payer: Medicare Other

## 2022-10-09 ENCOUNTER — Ambulatory Visit (HOSPITAL_COMMUNITY): Payer: Medicare Other | Admitting: Certified Registered"

## 2022-10-09 ENCOUNTER — Ambulatory Visit (HOSPITAL_COMMUNITY)
Admission: RE | Admit: 2022-10-09 | Discharge: 2022-10-09 | Disposition: A | Discharge status: Home / Self Care | Payer: Medicare Other | Attending: Internal Medicine | Admitting: Internal Medicine

## 2022-10-09 ENCOUNTER — Telehealth: Payer: Self-pay

## 2022-10-09 ENCOUNTER — Other Ambulatory Visit: Payer: Self-pay | Admitting: *Deleted

## 2022-10-09 ENCOUNTER — Encounter (HOSPITAL_COMMUNITY)
Admission: RE | Disposition: A | Discharge status: Home / Self Care | Payer: Self-pay | Source: Home / Self Care | Attending: Internal Medicine

## 2022-10-09 ENCOUNTER — Telehealth (HOSPITAL_COMMUNITY): Payer: Self-pay | Admitting: *Deleted

## 2022-10-09 ENCOUNTER — Encounter (HOSPITAL_COMMUNITY): Payer: Self-pay | Admitting: Internal Medicine

## 2022-10-09 DIAGNOSIS — I700 Atherosclerosis of aorta: Secondary | ICD-10-CM | POA: Insufficient documentation

## 2022-10-09 DIAGNOSIS — I2510 Atherosclerotic heart disease of native coronary artery without angina pectoris: Secondary | ICD-10-CM | POA: Insufficient documentation

## 2022-10-09 DIAGNOSIS — Z952 Presence of prosthetic heart valve: Secondary | ICD-10-CM

## 2022-10-09 DIAGNOSIS — Z7901 Long term (current) use of anticoagulants: Secondary | ICD-10-CM | POA: Insufficient documentation

## 2022-10-09 DIAGNOSIS — I471 Supraventricular tachycardia, unspecified: Secondary | ICD-10-CM | POA: Insufficient documentation

## 2022-10-09 DIAGNOSIS — I4891 Unspecified atrial fibrillation: Secondary | ICD-10-CM

## 2022-10-09 DIAGNOSIS — Z87891 Personal history of nicotine dependence: Secondary | ICD-10-CM

## 2022-10-09 DIAGNOSIS — I081 Rheumatic disorders of both mitral and tricuspid valves: Secondary | ICD-10-CM | POA: Diagnosis not present

## 2022-10-09 DIAGNOSIS — K219 Gastro-esophageal reflux disease without esophagitis: Secondary | ICD-10-CM | POA: Diagnosis not present

## 2022-10-09 DIAGNOSIS — Z7902 Long term (current) use of antithrombotics/antiplatelets: Secondary | ICD-10-CM | POA: Insufficient documentation

## 2022-10-09 DIAGNOSIS — I340 Nonrheumatic mitral (valve) insufficiency: Secondary | ICD-10-CM

## 2022-10-09 DIAGNOSIS — M1990 Unspecified osteoarthritis, unspecified site: Secondary | ICD-10-CM

## 2022-10-09 DIAGNOSIS — I252 Old myocardial infarction: Secondary | ICD-10-CM | POA: Insufficient documentation

## 2022-10-09 DIAGNOSIS — E785 Hyperlipidemia, unspecified: Secondary | ICD-10-CM | POA: Diagnosis not present

## 2022-10-09 DIAGNOSIS — Z5181 Encounter for therapeutic drug level monitoring: Secondary | ICD-10-CM

## 2022-10-09 DIAGNOSIS — Z8249 Family history of ischemic heart disease and other diseases of the circulatory system: Secondary | ICD-10-CM | POA: Diagnosis not present

## 2022-10-09 HISTORY — PX: TEE WITHOUT CARDIOVERSION: SHX5443

## 2022-10-09 HISTORY — PX: CARDIOVERSION: SHX1299

## 2022-10-09 LAB — ECHO TEE: Est EF: 25

## 2022-10-09 LAB — PROTIME-INR
INR: 4 — ABNORMAL HIGH (ref 0.8–1.2)
Prothrombin Time: 38.8 seconds — ABNORMAL HIGH (ref 11.4–15.2)

## 2022-10-09 SURGERY — ECHOCARDIOGRAM, TRANSESOPHAGEAL
Anesthesia: Monitor Anesthesia Care

## 2022-10-09 MED ORDER — PROPOFOL 500 MG/50ML IV EMUL
INTRAVENOUS | Status: DC | PRN
  Administered 2022-10-09: 75 ug/kg/min via INTRAVENOUS

## 2022-10-09 MED ORDER — PROPOFOL 10 MG/ML IV BOLUS
INTRAVENOUS | Status: DC | PRN
  Administered 2022-10-09: 20 mg via INTRAVENOUS
  Administered 2022-10-09: 30 mg via INTRAVENOUS

## 2022-10-09 MED ORDER — SODIUM CHLORIDE 0.9 % IV SOLN: INTRAVENOUS | Status: DC

## 2022-10-09 MED ORDER — EPHEDRINE SULFATE-NACL 50-0.9 MG/10ML-% IV SOSY
PREFILLED_SYRINGE | INTRAVENOUS | Status: DC | PRN
  Administered 2022-10-09: 5 mg via INTRAVENOUS

## 2022-10-09 NOTE — Anesthesia Postprocedure Evaluation (Signed)
Anesthesia Post Note  Patient: Jonathan Romero  Procedure(s) Performed: TRANSESOPHAGEAL ECHOCARDIOGRAM (TEE) CARDIOVERSION     Patient location during evaluation: PACU Anesthesia Type: MAC Level of consciousness: awake and alert Pain management: pain level controlled Vital Signs Assessment: post-procedure vital signs reviewed and stable Respiratory status: spontaneous breathing and respiratory function stable Cardiovascular status: stable Postop Assessment: no apparent nausea or vomiting Anesthetic complications: no   No notable events documented.  Last Vitals:  Vitals:   10/09/22 1437 10/09/22 1440  BP:    Pulse: (!) 54 (!) 53  Resp: 16 14  Temp:    SpO2: 99% 99%    Last Pain:  Vitals:   10/09/22 1437  TempSrc:   PainSc: 0-No pain                 Nairi Oswald DANIEL

## 2022-10-09 NOTE — Interval H&P Note (Signed)
History and Physical Interval Note:  10/09/2022 12:12 PM  Jonathan Romero  has presented today for surgery, with the diagnosis of afib.  The various methods of treatment have been discussed with the patient and family. After consideration of risks, benefits and other options for treatment, the patient has consented to  Procedure(s): TRANSESOPHAGEAL ECHOCARDIOGRAM (TEE) (N/A) CARDIOVERSION (N/A) as a surgical intervention.  The patient's history has been reviewed, patient examined, no change in status, stable for surgery.  I have reviewed the patient's chart and labs.  Questions were answered to the patient's satisfaction.     Dorris Carnes

## 2022-10-09 NOTE — Anesthesia Preprocedure Evaluation (Addendum)
Anesthesia Evaluation  Patient identified by MRN, date of birth, ID band Patient awake    Reviewed: Allergy & Precautions, NPO status , Patient's Chart, lab work & pertinent test results, reviewed documented beta blocker date and time   Airway Mallampati: II  TM Distance: >3 FB Neck ROM: Full    Dental  (+) Caps, Dental Advisory Given   Pulmonary former smoker   Pulmonary exam normal breath sounds clear to auscultation       Cardiovascular + CAD and + Past MI  + dysrhythmias Atrial Fibrillation + Valvular Problems/Murmurs AS  Rhythm:Regular Rate:Normal  EKG 09/24/22 NSR, RAD, inferior ST-T wave changes c/w ischemia  s/p 3 overlapping DES from proximal-mid LAD 08/13/22  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 Left ventricular ejection fraction, by estimation, is 40 to 45%. The  left ventricle has mildly decreased function. The left ventricle  demonstrates global hypokinesis. Left ventricular diastolic function could  not be evaluated.   2. Right ventricular systolic function is normal. The right ventricular  size is normal. There is normal pulmonary artery systolic pressure.   3. Left atrial size was moderately dilated.   4. The mitral valve is normal in structure. Trivial mitral valve  regurgitation. No evidence of mitral stenosis.   5. The aortic valve has been repaired/replaced. Aortic valve  regurgitation is not visualized. There is a 26 mm Edwards Sapien  prosthetic (TAVR) valve present in the aortic position. Echo findings are  consistent with normal structure and function of the  aortic valve prosthesis. Aortic valve area, by VTI measures 1.65 cm.  Aortic valve mean gradient measures 8.8 mmHg.   6. The inferior vena cava is normal in size with greater than 50%  respiratory variability, suggesting right atrial pressure of 3 mmHg.      Neuro/Psych negative neurological ROS  negative psych ROS   GI/Hepatic Neg liver  ROS,GERD  Medicated,,  Endo/Other  negative endocrine ROS    Renal/GU negative Renal ROS  negative genitourinary   Musculoskeletal  (+) Arthritis , Osteoarthritis,    Abdominal   Peds  Hematology Coumadin therapy- last INR 5.5 yesterday Plavix therapy   Anesthesia Other Findings   Reproductive/Obstetrics                             Anesthesia Physical Anesthesia Plan  ASA: 3  Anesthesia Plan: MAC   Post-op Pain Management: Minimal or no pain anticipated   Induction: Intravenous  PONV Risk Score and Plan: 1 and Treatment may vary due to age or medical condition and Ondansetron  Airway Management Planned: Natural Airway and Nasal Cannula  Additional Equipment: None  Intra-op Plan:   Post-operative Plan:   Informed Consent: I have reviewed the patients History and Physical, chart, labs and discussed the procedure including the risks, benefits and alternatives for the proposed anesthesia with the patient or authorized representative who has indicated his/her understanding and acceptance.     Dental advisory given  Plan Discussed with: CRNA and Anesthesiologist  Anesthesia Plan Comments:         Anesthesia Quick Evaluation

## 2022-10-09 NOTE — CV Procedure (Signed)
CARDIOVERSION  Indication:  Atrial fibrillation   Patient sedated by anesthesia with propofol intravenously      With pads in Apex / Base position, the patient cardioverted to SR with 200 J synchronized biphasic energy to junctional then SR .  Procedure was without complications  12 lead EKG pending    Dorris Carnes MD

## 2022-10-09 NOTE — Discharge Instructions (Signed)
TEE  YOU HAD AN CARDIAC PROCEDURE TODAY: Refer to the procedure report and other information in the discharge instructions given to you for any specific questions about what was found during the examination. If this information does not answer your questions, please call [facility] office at [phone] to clarify.   DIET: Your first meal following the procedure should be a light meal and then it is ok to progress to your normal diet. A half-sandwich or bowl of soup is an example of a good first meal. Heavy or fried foods are harder to digest and may make you feel nauseous or bloated. Drink plenty of fluids but you should avoid alcoholic beverages for 24 hours. If you had a esophageal dilation, please see attached instructions for diet.   ACTIVITY: Your care partner should take you home directly after the procedure. You should plan to take it easy, moving slowly for the rest of the day. You can resume normal activity the day after the procedure however YOU SHOULD NOT DRIVE, use power tools, machinery or perform tasks that involve climbing or major physical exertion for 24 hours (because of the sedation medicines used during the test).   SYMPTOMS TO REPORT IMMEDIATELY: A cardiologist can be reached at any hour. Please call [phone] for any of the following symptoms:  Vomiting of blood or coffee ground material  New, significant abdominal pain  New, significant chest pain or pain under the shoulder blades  Painful or persistently difficult swallowing  New shortness of breath  Black, tarry-looking or red, bloody stools  FOLLOW UP:  Please also call with any specific questions about appointments or follow up tests. Electrical Cardioversion  Electrical cardioversion is the delivery of a jolt of electricity to restore a normal rhythm to the heart. A rhythm that is too fast or is not regular keeps the heart from pumping well. In this procedure, sticky patches or metal paddles are placed on the  chest to deliver electricity to the heart from a device.  If this information does not answer your questions, please call [facility] office at [phone] to clarify.   Follow these instructions at home: You may have some redness on the skin where the shocks were given.  You may apply over-the-counter hydrocortisone cream or aloe vera to alleviate skin irritation. YOU SHOULD NOT DRIVE, use power tools, machinery or perform tasks that involve climbing or major physical exertion for 24 hours (because of the sedation medicines used during the test).  Take over-the-counter and prescription medicines only as told by your health care provider. Ask your health care provider how to check your pulse. Check it often. Rest for 48 hours after the procedure or as told by your health care provider. Avoid or limit your caffeine use as told by your health care provider. Keep all follow-up visits as told by your health care provider. This is important.  FOLLOW UP:  Please also call with any specific questions about appointments or follow up tests. Electrical Cardioversion  Electrical cardioversion is the delivery of a jolt of electricity to restore a normal rhythm to the heart. A rhythm that is too fast or is not regular keeps the heart from pumping well. In this procedure, sticky patches or metal paddles are placed on the chest to deliver electricity to the heart from a device.  If this information does not answer your questions, please call [facility] office at [phone] to clarify.   Follow these   instructions at home: You may have some redness on the skin where the shocks were given.  You may apply over-the-counter hydrocortisone cream or aloe vera to alleviate skin irritation. YOU SHOULD NOT DRIVE, use power tools, machinery or perform tasks that involve climbing or major physical exertion for 24 hours (because of the sedation medicines used during the  test).  Take over-the-counter and prescription medicines only as told by your health care provider. Ask your health care provider how to check your pulse. Check it often. Rest for 48 hours after the procedure or as told by your health care provider. Avoid or limit your caffeine use as told by your health care provider. Keep all follow-up visits as told by your health care provider. This is important.  FOLLOW UP:  Please also call with any specific questions about appointments or follow up tests.  

## 2022-10-09 NOTE — Progress Notes (Signed)
Echocardiogram Echocardiogram Transesophageal has been performed.  Jonathan Romero 10/09/2022, 2:13 PM

## 2022-10-09 NOTE — CV Procedure (Signed)
TEE   Indicatino:   Atrial fibrillation  Patient sedated by anesthesia with propofol and lidocaine intravenously Bite guard placed   TEE probe advanced through mouth into esophagus without difficulty  LA, LAA without masses  Full report to follow in echo report / CV section of chart  Dorris Carnes MD

## 2022-10-09 NOTE — Interval H&P Note (Signed)
History and Physical Interval Note:  10/09/2022 12:04 PM  Jonathan Romero  has presented today for surgery, with the diagnosis of afib.  The various methods of treatment have been discussed with the patient and family. After consideration of risks, benefits and other options for treatment, the patient has consented to  Procedure(s): TRANSESOPHAGEAL ECHOCARDIOGRAM (TEE) (N/A) CARDIOVERSION (N/A) as a surgical intervention.  The patient's history has been reviewed, patient examined, no change in status, stable for surgery.  I have reviewed the patient's chart and labs.  Questions were answered to the patient's satisfaction.     Dorris Carnes

## 2022-10-09 NOTE — Telephone Encounter (Signed)
-----  Message from Malen Gauze, RN sent at 10/08/2022  4:33 PM EST ----- Hi Ayeshia Coppin.  Pt is pending TEE/DCCV tomorrow.  His INR was 5.5 this afternoon.  He had already taken his warfarin today even though he wasn't suppose to take it till after he saw me.  I told him to eat plenty of dark leafy greens tonight.  Will hold warfarin tomorrow then decreased his dose.  Just wanted to make you aware he will probably need to be rechecked tomorrow before procedure.

## 2022-10-09 NOTE — Telephone Encounter (Addendum)
[  3:20 PM] Jonathan Romero this was sent to me by Jonathan Romero [3:20 PM] Jonathan Romero Dr Harrington Challenger saw pt at [facility] today for TEE/DCCV. She is a little concerned about him and wants him to come to the [facility] tomorrow for a BP check and go to lab for STAT PT/INR.  They are suppose to call from hospital to arrange but doesn't look like it's been done yet.  Wanted to make you aware.  Afternoon is better for patient.  Please schedule and let me know.  I'm off tomorrow and don't want this to slip thru the cracks.  Thanks    I placed STAT PT/INR order due on 10/10/22, left message for patient to return call to discuss lab work due and nurse apt scheduled for 2 pm on 10/10/22. Jonathan Shepperson,Jonathan Romero     4:22 pm patient called back and I advised him to come to [facility] main entrance,check in at 1 pm to have lab work, then come to cardiology for nurse visit.

## 2022-10-09 NOTE — Transfer of Care (Signed)
Immediate Anesthesia Transfer of Care Note  Patient: Jonathan Romero  Procedure(s) Performed: TRANSESOPHAGEAL ECHOCARDIOGRAM (TEE) CARDIOVERSION  Patient Location: PACU  Anesthesia Type:MAC  Level of Consciousness: drowsy and patient cooperative  Airway & Oxygen Therapy: Patient Spontanous Breathing and Patient connected to nasal cannula oxygen  Post-op Assessment: Report given to RN, Post -op Vital signs reviewed and stable, and Patient moving all extremities X 4  Post vital signs: Reviewed  Last Vitals:  Vitals Value Taken Time  BP 81/49 10/09/22 1406  Temp    Pulse 47 10/09/22 1407  Resp 17 10/09/22 1407  SpO2 99 % 10/09/22 1407  Vitals shown include unvalidated device data.  Last Pain:  Vitals:   10/09/22 1407  TempSrc: Temporal  PainSc:          Complications: No notable events documented.  BP treated with ephedrine. Dr. Harrington Challenger and Dr. Tobias Alexander aware

## 2022-10-10 ENCOUNTER — Other Ambulatory Visit: Payer: Self-pay | Admitting: Physician Assistant

## 2022-10-10 ENCOUNTER — Ambulatory Visit: Payer: Medicare Other

## 2022-10-10 ENCOUNTER — Ambulatory Visit (INDEPENDENT_AMBULATORY_CARE_PROVIDER_SITE_OTHER): Payer: Medicare Other

## 2022-10-10 ENCOUNTER — Other Ambulatory Visit (HOSPITAL_COMMUNITY)
Admission: RE | Admit: 2022-10-10 | Discharge: 2022-10-10 | Disposition: A | Discharge status: Home / Self Care | Payer: Medicare Other | Source: Ambulatory Visit | Attending: Internal Medicine | Admitting: Internal Medicine

## 2022-10-10 VITALS — BP 161/75 | HR 67 | Ht 70.0 in | Wt 158.2 lb

## 2022-10-10 DIAGNOSIS — I4891 Unspecified atrial fibrillation: Secondary | ICD-10-CM

## 2022-10-10 DIAGNOSIS — Z952 Presence of prosthetic heart valve: Secondary | ICD-10-CM

## 2022-10-10 DIAGNOSIS — I159 Secondary hypertension, unspecified: Secondary | ICD-10-CM

## 2022-10-10 DIAGNOSIS — Z5181 Encounter for therapeutic drug level monitoring: Secondary | ICD-10-CM | POA: Diagnosis not present

## 2022-10-10 LAB — PROTIME-INR
INR: 3.4 — ABNORMAL HIGH (ref 0.8–1.2)
Prothrombin Time: 34.1 seconds — ABNORMAL HIGH (ref 11.4–15.2)

## 2022-10-10 MED ORDER — METOPROLOL SUCCINATE ER 50 MG PO TB24: 50.0000 mg | ORAL_TABLET | Freq: Every day | ORAL | 3 refills | Status: DC

## 2022-10-10 NOTE — Progress Notes (Signed)
Patient presents today for HR/BP check. Patient stated that he checked bp this morning at home with readings at: 114/55 at rest. 137/81 with activity. Patient stated that he completes a bp log daily x3.   Patient stated that he is very active at home- remodeling home and takes care of his wife. Patients bp in office 161/75 hr 67.

## 2022-10-12 ENCOUNTER — Encounter (HOSPITAL_COMMUNITY): Payer: Self-pay | Admitting: Internal Medicine

## 2022-10-13 ENCOUNTER — Ambulatory Visit (INDEPENDENT_AMBULATORY_CARE_PROVIDER_SITE_OTHER): Payer: Medicare Other | Admitting: *Deleted

## 2022-10-13 DIAGNOSIS — Z5181 Encounter for therapeutic drug level monitoring: Secondary | ICD-10-CM | POA: Diagnosis not present

## 2022-10-13 DIAGNOSIS — I4891 Unspecified atrial fibrillation: Secondary | ICD-10-CM

## 2022-10-13 NOTE — Patient Instructions (Signed)
Started warfarin 5mg  daily on 09/27/21 S/P TEE/DCCV on 10/09/21 Continue warfarin 1 tablet daily  Continue greens Recheck on 10/15/22 Pt told

## 2022-10-14 ENCOUNTER — Telehealth: Payer: Self-pay | Admitting: Cardiology

## 2022-10-14 NOTE — Telephone Encounter (Signed)
Unable to leave message on phone.

## 2022-10-14 NOTE — Telephone Encounter (Signed)
Spoke with patient who reported he is not to take coumadin tonight and get INR tomorrow. Reviewed earlier messages from this afternoon with patient. He verbalized understanding. Patient also stated when he takes Tums or Maalox the bleeding stops. He thinks he may have a GI ulcer.

## 2022-10-14 NOTE — Telephone Encounter (Signed)
Pt returning nurses call from earlier. Please advise

## 2022-10-14 NOTE — Telephone Encounter (Signed)
Called patient, advised from previous telephone call.   Patient is currently on coumadin s/p tavr (07/2022), new onset afib. he states he is having some bright red bleeding noted in his stool. he does have hemorrhoids (which I advised could be the cause of the bleeding) he states he also noticed some bleeding in his gums a little this morning (like an amber color) before he brushed his teeth. he did not take his dose yet for 2:00, and wants to know if he should take it. PT/INR was a little higher at last check, but no changes. he states he has an appt with [facility] tomorrow.   Advised I would check with PHARMD / MD as well and would call back.

## 2022-10-14 NOTE — Telephone Encounter (Signed)
Pt c/o medication issue:  1. Name of Medication:  warfarin (COUMADIN) 5 MG tablet   2. How are you currently taking this medication (dosage and times per day)? Warfarin every day after 10 pm  3. Are you having a reaction (difficulty breathing--STAT)? no  4. What is your medication issue? Patient states he had blood in his stool today. He also states his gums have some faint blood when he spits and he has not brushed his teeth. He says he has not taken his warfarin today yet, because he takes it after 10 pm. He says he also takes Forsemide if his BP top is over 90. He says his BP is 142/74 HR 69, but he is not sure what is going on. He says he has mild Hemorrhoids and is not sure if the bleeding is from that or his medications. He says the bleeding is not bad right now.

## 2022-10-14 NOTE — Telephone Encounter (Signed)
Called patient, advised of message from [facility] secure chat:  Per Erasmo Downer, [facility]:  have him hold the dose today just to be safe. Most likely the bleeding is from hemorrhoids/straining, but if he's also got some gum bleeding I'd err on the side of caution (his INR on the 17th was 5.5). Just stress getting to his appt tomorrow. thanks   Patient was advised and verbalized he would be at his appointment.   Patient verbalized understanding.

## 2022-10-15 ENCOUNTER — Ambulatory Visit: Payer: Medicare Other | Attending: Physician Assistant | Admitting: *Deleted

## 2022-10-15 DIAGNOSIS — Z952 Presence of prosthetic heart valve: Secondary | ICD-10-CM

## 2022-10-15 DIAGNOSIS — I4891 Unspecified atrial fibrillation: Secondary | ICD-10-CM

## 2022-10-15 DIAGNOSIS — Z5181 Encounter for therapeutic drug level monitoring: Secondary | ICD-10-CM

## 2022-10-15 LAB — POCT INR: INR: 3.7 — AB (ref 2.0–3.0)

## 2022-10-15 NOTE — Patient Instructions (Signed)
Started warfarin 5mg  daily on 09/27/21 S/P TEE/DCCV on 10/09/21 Held warfarin last night due to rectal bleeding Hold warfarin again tonight then decrease dose to 1 tablet daily except 1/2 tablet on Sundays, Tuesdays and Thursdays  Continue greens Recheck on 10/21/22

## 2022-10-16 ENCOUNTER — Ambulatory Visit (HOSPITAL_COMMUNITY)
Admission: RE | Admit: 2022-10-16 | Discharge: 2022-10-16 | Disposition: A | Discharge status: Home / Self Care | Payer: Medicare Other | Source: Ambulatory Visit | Attending: Physician Assistant | Admitting: Physician Assistant

## 2022-10-16 ENCOUNTER — Encounter (HOSPITAL_COMMUNITY): Payer: Self-pay | Admitting: Physician Assistant

## 2022-10-16 VITALS — BP 138/80 | HR 64 | Ht 70.0 in | Wt 156.0 lb

## 2022-10-16 DIAGNOSIS — I471 Supraventricular tachycardia, unspecified: Secondary | ICD-10-CM | POA: Insufficient documentation

## 2022-10-16 DIAGNOSIS — I4819 Other persistent atrial fibrillation: Secondary | ICD-10-CM | POA: Diagnosis not present

## 2022-10-16 DIAGNOSIS — I2510 Atherosclerotic heart disease of native coronary artery without angina pectoris: Secondary | ICD-10-CM | POA: Diagnosis not present

## 2022-10-16 DIAGNOSIS — E785 Hyperlipidemia, unspecified: Secondary | ICD-10-CM | POA: Insufficient documentation

## 2022-10-16 DIAGNOSIS — Z9861 Coronary angioplasty status: Secondary | ICD-10-CM | POA: Diagnosis not present

## 2022-10-16 DIAGNOSIS — D6869 Other thrombophilia: Secondary | ICD-10-CM | POA: Insufficient documentation

## 2022-10-16 DIAGNOSIS — I5020 Unspecified systolic (congestive) heart failure: Secondary | ICD-10-CM | POA: Diagnosis not present

## 2022-10-16 DIAGNOSIS — Z79899 Other long term (current) drug therapy: Secondary | ICD-10-CM | POA: Insufficient documentation

## 2022-10-16 DIAGNOSIS — I110 Hypertensive heart disease with heart failure: Secondary | ICD-10-CM | POA: Diagnosis not present

## 2022-10-16 DIAGNOSIS — I38 Endocarditis, valve unspecified: Secondary | ICD-10-CM | POA: Insufficient documentation

## 2022-10-16 LAB — BASIC METABOLIC PANEL
Anion gap: 8 (ref 5–15)
BUN: 21 mg/dL (ref 8–23)
CO2: 26 mmol/L (ref 22–32)
Calcium: 8.6 mg/dL — ABNORMAL LOW (ref 8.9–10.3)
Chloride: 106 mmol/L (ref 98–111)
Creatinine, Ser: 0.96 mg/dL (ref 0.61–1.24)
GFR, Estimated: >60 mL/min (ref >60)
Glucose, Bld: 116 mg/dL — ABNORMAL HIGH (ref 70–99)
Potassium: 4.2 mmol/L (ref 3.5–5.1)
Sodium: 140 mmol/L (ref 135–145)

## 2022-10-16 NOTE — Progress Notes (Signed)
Primary Care Physician: Sharilyn Sites, MD Primary Cardiologist: Dr Martinique TAVR: Dr Miguel Dibble Primary Electrophysiologist: none Referring Physician: Nell Range PA   Jonathan Romero is a 77 y.o. male with a history of HLD, SVT, CAD, severe AS s/p TAVR 78/5885, systolic dysfunction, atrial fibrillation who presents for consultation in the [facility]. On 08/09/22 he had two episodes of exertional syncope episode. His wife called 83 and EMS found the patient in SVT with a reported HR of 280. He was then cardioverted into NSR. Upon arrival to the ED he was found to have a heart murmur and NSTEMI. Echo 08/10/22 revealed EF 40-45% with severe AS. R/LHC was 08/11/22 showed a 95% calcified LAD lesion. He was evaluated by the multidisciplinary valve team and felt to not be a surgical candidate due to significant aortic calcification found on CTA. He ultimately underwent successful PCI with placement of 3 overlapping drug-eluting stents from the proximal to mid LAD on 08/13/22. He was discharged home on 08/14/22 with plans to return for TAVR. He underwent successful TAVR on 08/19/22.   He was seen on 09/24/22 for 1 month follow up. Echo showed EF 40-45%, normally functioning TAVR with a mean gradient of 8.8 mmHg and no PVL. He was noted to be in rapid afib, which was asymptomatic. Toprol 25mg  daily increased to BID. Started on Elquis which was later converted to Coumadin due to cost issues. Aspirin discontinued. Patient has a CHADS2VASC score of 4.  On follow up today, patient is s/p TEE guided DCCV on 10/09/22. He remains in SR today and feels "back to his old self". He has had some rectal bleeding, suspected from hemorrhoids. INR yesterday showed 3.7, followed in [place] in [place].   Today, he denies symptoms of palpitations, chest pain, shortness of breath, orthopnea, PND, lower extremity edema, dizziness, presyncope, syncope, snoring, daytime somnolence, bleeding, or  neurologic sequela. The patient is tolerating medications without difficulties and is otherwise without complaint today.    Atrial Fibrillation Risk Factors:  he does not have symptoms or diagnosis of sleep apnea. he does not have a history of rheumatic fever. he does not have a history of alcohol use. The patient does not have a history of early familial atrial fibrillation or other arrhythmias.  he has a BMI of Body mass index is 22.38 kg/m.Marland Kitchen Filed Weights   10/16/22 1033  Weight: 70.8 kg    Family History  Problem Relation Age of Onset   Diabetes Mother    Heart failure Father      Atrial Fibrillation Management history:  Previous antiarrhythmic drugs: none Previous cardioversions: 10/09/22 Previous ablations: none CHADS2VASC score: 4 Anticoagulation history: Eliquis, warfarin   Past Medical History:  Diagnosis Date   Arthritis    Cellulitis    Coronary artery disease    s/p 3 overlapping DES from proximal-mid LAD 08/13/22   Dyslipidemia    GERD (gastroesophageal reflux disease)    Hyperlipidemia 09/23/2011   LDL particle number [id number] with LDL CALULATED at 151 and 2300 in 1988 was the small LDL particle number,which is very high- not tinterested in taking medications   Myocardial infarction ([place])    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 Burt Knack & Weldner   Severe aortic stenosis    Past Surgical History:  Procedure Laterality Date   CARDIOVERSION N/A 10/09/2022   Procedure: CARDIOVERSION;  Surgeon: Dorris Carnes  V, MD;  Location: [facility];  Service: Cardiovascular;  Laterality: N/A;   CATARACT EXTRACTION     CORONARY ATHERECTOMY N/A 08/13/2022   Procedure: CORONARY ATHERECTOMY;  Surgeon: Sherren Mocha, MD;  Location: [facility];  Service: Cardiovascular;  Laterality: N/A;   CORONARY STENT INTERVENTION N/A 08/13/2022   Procedure: CORONARY STENT INTERVENTION;   Surgeon: Sherren Mocha, MD;  Location: [facility];  Service: Cardiovascular;  Laterality: N/A;   DOPPLER ECHOCARDIOGRAPHY  12/21/2012   EF 55-60%,   DOPPLER ECHOCARDIOGRAPHY  12/16/2011   MILD AORTIC STENOSIS,PEAK AND MEAN GRADIENTS OF 18 AND 8 mmHg and a valve area of around 2 square cm.    INTRAOPERATIVE TRANSTHORACIC ECHOCARDIOGRAM N/A 08/19/2022   Procedure: INTRAOPERATIVE TRANSTHORACIC ECHOCARDIOGRAM;  Surgeon: Sherren Mocha, MD;  Location: [facility];  Service: Open Heart Surgery;  Laterality: N/A;   RIGHT/LEFT HEART CATH AND CORONARY ANGIOGRAPHY N/A 08/11/2022   Procedure: RIGHT/LEFT HEART CATH AND CORONARY ANGIOGRAPHY;  Surgeon: Belva Crome, MD;  Location: [facility];  Service: Cardiovascular;  Laterality: N/A;   TEE WITHOUT CARDIOVERSION N/A 10/09/2022   Procedure: TRANSESOPHAGEAL ECHOCARDIOGRAM (TEE);  Surgeon: Fay Records, MD;  Location: [facility];  Service: Cardiovascular;  Laterality: N/A;   TRANSCATHETER AORTIC VALVE REPLACEMENT, TRANSFEMORAL N/A 08/19/2022   Procedure: Transcatheter Aortic Valve Replacement, Transfemoral using a 26 MM Edwards SAPIEN 3 Ultra;  Surgeon: Sherren Mocha, MD;  Location: [facility];  Service: Open Heart Surgery;  Laterality: N/A;  Transfemoral    Current Outpatient Medications  Medication Sig Dispense Refill   amoxicillin (AMOXIL) 500 MG tablet Take 4 tablets (2,000 mg total) by mouth as directed. 1 hour prior to dental work including cleanings 12 tablet 12   clopidogrel (PLAVIX) 75 MG tablet Take 1 tablet (75 mg total) by mouth daily. 90 tablet 3   ezetimibe (ZETIA) 10 MG tablet Take 1 tablet (10 mg total) by mouth daily. 90 tablet 3   furosemide (LASIX) 20 MG tablet Take 1 tablet (20 mg total) by mouth daily. 30 tablet 6   hydrocortisone cream 1 % Apply 1 Application topically daily as needed (cracked skin).     metoprolol succinate (TOPROL-XL) 50 MG 24 hr tablet Take 1 tablet (50 mg total) by mouth daily. Take with or immediately  following a meal. 180 tablet 3   nitroGLYCERIN (NITROSTAT) 0.4 MG SL tablet Place 1 tablet (0.4 mg total) under the tongue every 5 (five) minutes x 3 doses as needed for chest pain. 25 tablet 2   warfarin (COUMADIN) 5 MG tablet Take 1 tablet by mouth daily or as directed by Anticoagulation Clinic. (Patient taking differently: Take 5-7.5 mg by mouth See admin instructions.) 30 tablet 0   No current facility-administered medications for this encounter.    Allergies  Allergen Reactions   Doxycycline Other (See Comments)    Joint pain   Statins     Joint pain    Social History   Socioeconomic History   Marital status: Married    Spouse name: Henrita   Number of children: 3   Years of education: Not on file   Highest education level: High school graduate  Occupational History   Occupation: retired  Tobacco Use   Smoking status: Former    Types: Cigarettes    Quit date: 04/15/1983    Years since quitting: 39.5   Smokeless tobacco: Never   Tobacco comments:    Former smoker 10/16/22  Vaping Use   Vaping Use: Never used  Substance and Sexual Activity   Alcohol use: Yes    Comment: beer occasionally 10/16/22   Drug use: No   Sexual activity: Not on file  Other Topics Concern   Not on file  Social History Narrative   Not on file   Social Determinants of Health   Financial Resource Strain: Low Risk  (08/13/2022)   Overall Financial Resource Strain (CARDIA)    Difficulty of Paying Living Expenses: Not hard at all  Food Insecurity: No Food Insecurity (08/10/2022)   Hunger Vital Sign    Worried About Running Out of Food in the Last Year: Never true    Ran Out of Food in the Last Year: Never true  Transportation Needs: No Transportation Needs (08/13/2022)   PRAPARE - Administrator, Civil Service (Medical): No    Lack of Transportation (Non-Medical): No  Physical Activity: Not on file  Stress: Not on file  Social Connections: Not on file  Intimate Partner  Violence: Not At Risk (08/10/2022)   Humiliation, Afraid, Rape, and Kick questionnaire    Fear of Current or Ex-Partner: No    Emotionally Abused: No    Physically Abused: No    Sexually Abused: No     ROS- All systems are reviewed and negative except as per the HPI above.  Physical Exam: Vitals:   10/16/22 1033  BP: 138/80  Pulse: 64  Weight: 70.8 kg  Height: 5\' 10"  (1.778 m)    GEN- The patient is a well appearing elderly male, alert and oriented x 3 today.   Head- normocephalic, atraumatic Eyes-  Sclera clear, conjunctiva pink Ears- hearing intact Oropharynx- clear Neck- supple  Lungs- Clear to ausculation bilaterally, normal work of breathing Heart- Regular rate and rhythm, no murmurs, rubs or gallops  GI- soft, NT, ND, + BS Extremities- no clubbing, cyanosis, or edema MS- no significant deformity or atrophy Skin- no rash or lesion Psych- euthymic mood, full affect Neuro- strength and sensation are intact  Wt Readings from Last 3 Encounters:  10/16/22 70.8 kg  10/10/22 71.8 kg  10/09/22 67.8 kg    EKG today demonstrates  SR Vent. rate 64 BPM PR interval 164 ms QRS duration 92 ms QT/QTcB 428/441 ms  Echo 09/24/22 demonstrated   1. Left ventricular ejection fraction, by estimation, is 40 to 45%. The  left ventricle has mildly decreased function. The left ventricle  demonstrates global hypokinesis. Left ventricular diastolic function could  not be evaluated.   2. Right ventricular systolic function is normal. The right ventricular  size is normal. There is normal pulmonary artery systolic pressure.   3. Left atrial size was moderately dilated.   4. The mitral valve is normal in structure. Trivial mitral valve  regurgitation. No evidence of mitral stenosis.   5. The aortic valve has been repaired/replaced. Aortic valve  regurgitation is not visualized. There is a 26 mm Edwards Sapien  prosthetic (TAVR) valve present in the aortic position. Echo findings are   consistent with normal structure and function of the  aortic valve prosthesis. Aortic valve area, by VTI measures 1.65 cm.  Aortic valve mean gradient measures 8.8 mmHg.   6. The inferior vena cava is normal in size with greater than 50%  respiratory variability, suggesting right atrial pressure of 3 mmHg.   Comparison(s): Prior images reviewed side by side. Changes from prior  study are noted. EF reduced compared to echo from 08/20/22, but is similar  to echo from 08/10/22 (pre-TAVR).  Conclusion(s)/Recommendation(s): In atrial fibrillation with RVR  throughout study.   Epic records are reviewed at length today  CHA2DS2-VASc Score = 4  The patient's score is based upon: CHF History: 1 HTN History: 0 Diabetes History: 0 Stroke History: 0 Vascular Disease History: 1 Age Score: 2 Gender Score: 0       ASSESSMENT AND PLAN: 1. Persistent Atrial Fibrillation (ICD10:  I48.19) The patient's CHA2DS2-VASc score is 4, indicating a 4.8% annual risk of stroke.   S/p TEE/DCCV 10/09/22 Patient appears to be maintaining SR. We discussed rhythm control options briefly today. He would like to keep his present therapy and only do something else "if absolutely necessary".  Continue Toprol 50 mg daily Continue warfarin  2. Secondary Hypercoagulable State (ICD10:  D68.69) The patient is at significant risk for stroke/thromboembolism based upon his CHA2DS2-VASc Score of 4.  Continue Warfarin (Coumadin).   3. CAD S/p PCI x 3 No anginal symptoms.  4. Valvular heart disease Severe AS s/p TAVR  5. Systolic CHF EF [id number] while in afib Fluid status appears stable today. Could consider repeat echo at follow up to make sure EF has recovered with SR.    Follow up with Dr Martinique as scheduled. AF clinic if further rhythm control is needed.    [facility] [address] [place], [place] [address] [phone] 10/16/2022 11:20 AM

## 2022-10-21 ENCOUNTER — Ambulatory Visit: Payer: Medicare Other | Attending: Physician Assistant | Admitting: *Deleted

## 2022-10-21 DIAGNOSIS — I4891 Unspecified atrial fibrillation: Secondary | ICD-10-CM

## 2022-10-21 DIAGNOSIS — Z5181 Encounter for therapeutic drug level monitoring: Secondary | ICD-10-CM

## 2022-10-21 DIAGNOSIS — Z952 Presence of prosthetic heart valve: Secondary | ICD-10-CM | POA: Diagnosis not present

## 2022-10-21 LAB — POCT INR: INR: 1.7 — AB (ref 2.0–3.0)

## 2022-10-21 NOTE — Patient Instructions (Signed)
Started warfarin 5mg  daily on 09/27/21 S/P TEE/DCCV on 10/09/21 Increase warfarin to 1 tablet daily except 1/2 tablet on Sundays and Thursdays  Continue greens Recheck on 10/30/22

## 2022-10-28 ENCOUNTER — Other Ambulatory Visit: Payer: Self-pay | Admitting: Physician Assistant

## 2022-10-28 DIAGNOSIS — I4891 Unspecified atrial fibrillation: Secondary | ICD-10-CM

## 2022-10-28 NOTE — Telephone Encounter (Signed)
*  STAT* If patient is at the pharmacy, call can be transferred to refill team.   1. Which medications need to be refilled? (please list name of each medication and dose if known) new prescription for Warfarin  2. Which pharmacy/location (including street and city if local pharmacy) is medication to be sent to?[facility] [place]  3. Do they need a 30 day or 90 day supply? 90 days and refills

## 2022-10-29 MED ORDER — WARFARIN SODIUM 5 MG PO TABS: ORAL_TABLET | ORAL | 0 refills | Status: DC

## 2022-10-30 ENCOUNTER — Ambulatory Visit: Payer: Medicare Other | Attending: Physician Assistant | Admitting: *Deleted

## 2022-10-30 DIAGNOSIS — I4891 Unspecified atrial fibrillation: Secondary | ICD-10-CM | POA: Diagnosis not present

## 2022-10-30 DIAGNOSIS — Z5181 Encounter for therapeutic drug level monitoring: Secondary | ICD-10-CM

## 2022-10-30 DIAGNOSIS — Z952 Presence of prosthetic heart valve: Secondary | ICD-10-CM | POA: Diagnosis not present

## 2022-10-30 LAB — POCT INR: INR: 2.6 (ref 2.0–3.0)

## 2022-10-30 NOTE — Patient Instructions (Signed)
Started warfarin 5mg  daily on 09/27/21 S/P TEE/DCCV on 10/09/21 Continue warfarin 1 tablet daily except 1/2 tablet on Sundays and Thursdays  Continue greens Recheck on 11/13/22

## 2022-11-03 ENCOUNTER — Ambulatory Visit (HOSPITAL_COMMUNITY): Payer: Medicare Other | Admitting: Physician Assistant

## 2022-11-19 ENCOUNTER — Ambulatory Visit: Payer: Medicare Other | Attending: Physician Assistant | Admitting: *Deleted

## 2022-11-19 DIAGNOSIS — Z952 Presence of prosthetic heart valve: Secondary | ICD-10-CM

## 2022-11-19 DIAGNOSIS — I4891 Unspecified atrial fibrillation: Secondary | ICD-10-CM | POA: Diagnosis not present

## 2022-11-19 DIAGNOSIS — Z5181 Encounter for therapeutic drug level monitoring: Secondary | ICD-10-CM | POA: Diagnosis not present

## 2022-11-19 LAB — POCT INR: INR: 2.5 (ref 2.0–3.0)

## 2022-11-19 NOTE — Patient Instructions (Signed)
Started warfarin '5mg'$  daily on 09/27/21 S/P TEE/DCCV on 10/09/21 Continue warfarin 1 tablet daily except 1/2 tablet on Sundays and Thursdays  Continue greens Recheck in 3 weeks

## 2022-12-04 ENCOUNTER — Other Ambulatory Visit: Payer: Self-pay | Admitting: Cardiology

## 2022-12-04 DIAGNOSIS — I4891 Unspecified atrial fibrillation: Secondary | ICD-10-CM

## 2022-12-04 NOTE — Telephone Encounter (Signed)
*  STAT* If patient is at the pharmacy, call can be transferred to refill team.   1. Which medications need to be refilled? (please list name of each medication and dose if known) warfarin (COUMADIN) 5 MG tablet  2. Which pharmacy/location (including street and city if local pharmacy) is medication to be sent to? [id number] - [place], [place] - [address]  3. Do they need a 30 day or 90 day supply? 90  

## 2022-12-05 MED ORDER — WARFARIN SODIUM 5 MG PO TABS: ORAL_TABLET | ORAL | 0 refills | Status: DC

## 2022-12-05 NOTE — Telephone Encounter (Signed)
Prescription refill request received for warfarin Lov:  10/01/2022 Next INR check: 3/20 Warfarin tablet strength: 5mg 

## 2022-12-10 ENCOUNTER — Ambulatory Visit: Payer: Medicare Other | Attending: Physician Assistant

## 2022-12-10 DIAGNOSIS — I4811 Longstanding persistent atrial fibrillation: Secondary | ICD-10-CM | POA: Diagnosis not present

## 2022-12-10 DIAGNOSIS — Z952 Presence of prosthetic heart valve: Secondary | ICD-10-CM | POA: Diagnosis not present

## 2022-12-10 DIAGNOSIS — Z5181 Encounter for therapeutic drug level monitoring: Secondary | ICD-10-CM

## 2022-12-10 DIAGNOSIS — I4891 Unspecified atrial fibrillation: Secondary | ICD-10-CM | POA: Diagnosis not present

## 2022-12-10 LAB — POCT INR: INR: 2.7 (ref 2.0–3.0)

## 2022-12-10 NOTE — Patient Instructions (Signed)
Description   Continue warfarin 1 tablet daily except 1/2 tablet on Sundays and Thursdays  Continue greens Recheck in 4 weeks

## 2022-12-19 ENCOUNTER — Telehealth: Payer: Self-pay | Admitting: Cardiology

## 2022-12-19 NOTE — Telephone Encounter (Signed)
Calling to get the suggestion on what he can take for his sore throat. Advise patient that his PCP would probably be best to answer that but still wanted me to send message back. Please advise

## 2022-12-19 NOTE — Telephone Encounter (Signed)
Spoke with patient - advised OK to use chloraseptic spray, cough lozenge, honey. He did say he may have some post-nasal drip, stuffiness. He said he may have allergies. Advised OK to try Claritin, Zyrtec - just not with the -DM/D.

## 2023-01-19 ENCOUNTER — Ambulatory Visit: Payer: Medicare Other | Attending: Physician Assistant | Admitting: *Deleted

## 2023-01-19 DIAGNOSIS — Z5181 Encounter for therapeutic drug level monitoring: Secondary | ICD-10-CM

## 2023-01-19 DIAGNOSIS — Z952 Presence of prosthetic heart valve: Secondary | ICD-10-CM | POA: Diagnosis not present

## 2023-01-19 DIAGNOSIS — I4891 Unspecified atrial fibrillation: Secondary | ICD-10-CM | POA: Diagnosis not present

## 2023-01-19 LAB — POCT INR: INR: 2.3 (ref 2.0–3.0)

## 2023-01-19 NOTE — Patient Instructions (Signed)
Continue warfarin 1 tablet daily except 1/2 tablet on Sundays and Thursdays  Continue greens Recheck in 6 weeks

## 2023-01-25 NOTE — Progress Notes (Signed)
[facility]                                        Cardiology Office Note:    Date:  01/29/2023   ID:  Jonathan Romero, DOB 1946/02/13, MRN [id number]  PCP:  Assunta Found, MD  [facility] Cardiologist:  Adelyna Brockman [place], MD  [facility] Electrophysiologist:  None   Referring MD: Assunta Found, MD   Follow up Afib, CAD, s/p TAVR  History of Present Illness:    Jonathan Romero is a 77 y.o. male with a hx of HLD, SVT, CAD requiring LAD PCI/DES x3 (08/13/22), severe AS s/p TAVR (08/19/22) and afib with RVR who presents to clinic for follow up.   On 08/09/22 he had two episodes of exertional syncope episode. His wife called 911 and EMS found the patient in SVT with a reported HR of 280. He was then cardioverted into NSR. Upon arrival to the ED he was found to have a heart murmur and NSTEMI. Echo 08/10/22 revealed EF 40-45% with severe AS with a mean gradient of 45.0 mmHg, peak gradient of 67.57mmHg, and AVA 0.53 cm2. R/LHC was 08/11/22 showed a 95% calcified LAD lesion. He was evaluated by the multidisciplinary valve team and felt to not be a surgical candidate due to significant aortic calcification found on CTA. He ultimately underwent successful PCI with orbital atherectomy, PTCA, and placement of 3 overlapping drug-eluting stents from the proximal to mid LAD on 08/13/22. He was discharged home on 08/14/22 with plans to return on 08/19/22 for TAVR.  He underwent successful TAVR with a 26 mm Edwards Sapien 3 Ultra Resilia THV via the TF approach on 08/19/22. Post operative echo showed EF 55%, normally functioning TAVR with a mean gradient of 12 mmHg and no PVL. He was continued on DAPT.   He has done well in follow up with an improvement in his symptoms. He was seen on 09/24/22 for 1 month follow up. Echo showed EF 40-45%, normally functioning TAVR with a mean gradient of 8.8 mmHg and no PVL. He was noted to be in rapid afib, which was asymptomatic. Toprol 25mg  daily increased to BID.  Started on Elquis which was later converted to Coumadin due to cost issues. Aspirin discontinued. s/p TEE guided DCCV on 10/09/22. He was seen in Afib clinic on 10/16/22 and was still in NSR at that time. Of note TEE showed significant decrease in LV function with EF 25%.   Today the patient presents to clinic for follow up. Here is here with son. He continues to feel quite well with no issues and no symptoms related to afib. No CP or SOB. He checks his pulse 3 times a day and no irregularity. BP has been excellent at home. No LE edema, orthopnea or PND. No dizziness or syncope. No blood in stool or urine. No palpitations. He is very active doing Presenter, broadcasting and carpentry work.   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 Excell Seltzer & Leafy Ro   Severe aortic stenosis     Past Surgical History:  Procedure Laterality Date   CARDIOVERSION N/A 10/09/2022   Procedure: CARDIOVERSION;  Surgeon: Pricilla Riffle, MD;  Location: [facility] ENDOSCOPY;  Service: Cardiovascular;  Laterality: N/A;   CATARACT EXTRACTION     CORONARY ATHERECTOMY N/A 08/13/2022   Procedure: CORONARY ATHERECTOMY;  Surgeon: Tonny Bollman, MD;  Location: [facility] INVASIVE CV LAB;  Service: Cardiovascular;  Laterality: N/A;   CORONARY STENT INTERVENTION N/A 08/13/2022   Procedure: CORONARY STENT INTERVENTION;  Surgeon: Tonny Bollman, MD;  Location: [facility] INVASIVE CV LAB;  Service: Cardiovascular;  Laterality: N/A;   DOPPLER ECHOCARDIOGRAPHY   12/21/2012   EF 55-60%,   DOPPLER ECHOCARDIOGRAPHY  12/16/2011   MILD AORTIC STENOSIS,PEAK AND MEAN GRADIENTS OF 18 AND 8 mmHg and a valve area of around 2 square cm.    INTRAOPERATIVE TRANSTHORACIC ECHOCARDIOGRAM N/A 08/19/2022   Procedure: INTRAOPERATIVE TRANSTHORACIC ECHOCARDIOGRAM;  Surgeon: Tonny Bollman, MD;  Location: [facility] OR;  Service: Open Heart Surgery;  Laterality: N/A;   RIGHT/LEFT HEART CATH AND CORONARY ANGIOGRAPHY N/A 08/11/2022   Procedure: RIGHT/LEFT HEART CATH AND CORONARY ANGIOGRAPHY;  Surgeon: Lyn Records, MD;  Location: [facility] INVASIVE CV LAB;  Service: Cardiovascular;  Laterality: N/A;   TEE WITHOUT CARDIOVERSION N/A 10/09/2022   Procedure: TRANSESOPHAGEAL ECHOCARDIOGRAM (TEE);  Surgeon: Pricilla Riffle, MD;  Location: [facility] ENDOSCOPY;  Service: Cardiovascular;  Laterality: N/A;   TRANSCATHETER AORTIC VALVE REPLACEMENT, TRANSFEMORAL N/A 08/19/2022   Procedure: Transcatheter Aortic Valve Replacement, Transfemoral using a 26 MM Edwards SAPIEN 3 Ultra;  Surgeon: Tonny Bollman, MD;  Location: [facility] OR;  Service: Open Heart Surgery;  Laterality: N/A;  Transfemoral    Current Medications: Current Meds  Medication Sig   amoxicillin (AMOXIL) 500 MG tablet Take 4 tablets (2,000 mg total) by mouth as directed. 1 hour prior to dental work including cleanings   clopidogrel (PLAVIX) 75 MG tablet Take 1 tablet (75 mg total) by mouth daily.   ezetimibe (ZETIA) 10 MG tablet Take 1 tablet (10 mg total) by mouth daily.   furosemide (LASIX) 20 MG tablet Take 1 tablet (20 mg total) by mouth daily.   hydrocortisone cream 1 % Apply 1 Application topically daily as needed (cracked skin).   metoprolol succinate (TOPROL-XL) 50 MG 24 hr tablet Take 1 tablet (50 mg total) by mouth daily. Take with or immediately following a meal.   nitroGLYCERIN (NITROSTAT) 0.4 MG SL tablet Place 1 tablet (0.4 mg total) under the tongue every 5 (five) minutes x 3 doses as needed for chest pain.   warfarin (COUMADIN) 5 MG  tablet Take 1 tablet by mouth daily or as directed by Anticoagulation Clinic.     Allergies:   Doxycycline and Statins   Social History   Socioeconomic History   Marital status: Married    Spouse name: Henrita   Number of children: 3   Years of education: Not on file   Highest education level: High school graduate  Occupational History   Occupation: retired  Tobacco Use   Smoking status: Former    Types: Cigarettes    Quit date: 04/15/1983    Years since quitting: 39.8   Smokeless tobacco: Never   Tobacco comments:    Former smoker 10/16/22  Vaping Use   Vaping Use: Never used  Substance and Sexual Activity   Alcohol  use: Yes    Comment: beer occasionally 10/16/22   Drug use: No   Sexual activity: Not on file  Other Topics Concern   Not on file  Social History Narrative   Not on file   Social Determinants of Health   Financial Resource Strain: Low Risk  (08/13/2022)   Overall Financial Resource Strain (CARDIA)    Difficulty of Paying Living Expenses: Not hard at all  Food Insecurity: No Food Insecurity (08/10/2022)   Hunger Vital Sign    Worried About Running Out of Food in the Last Year: Never true    Ran Out of Food in the Last Year: Never true  Transportation Needs: No Transportation Needs (08/13/2022)   PRAPARE - Administrator, Civil Service (Medical): No    Lack of Transportation (Non-Medical): No  Physical Activity: Not on file  Stress: Not on file  Social Connections: Not on file     Family History: The patient's family history includes Diabetes in his mother; Heart failure in his father.  ROS:   Please see the history of present illness.    All other systems reviewed and are negative.  EKGs/Labs/Other Studies Reviewed:    The following studies were reviewed today:  TAVR OPERATIVE NOTE     Date of Procedure:                08/19/2022   Preoperative Diagnosis:      Severe Aortic Stenosis    Postoperative Diagnosis:    Same     Procedure:        Transcatheter Aortic Valve Replacement - Percutaneous  Transfemoral Approach             Edwards Sapien 3 Ultra Resilia THV (size 26 mm, serial #[id number])              Co-Surgeons:                        Eugenio Hoes, MD and Tonny Bollman, MD   Anesthesiologist:                  Eilene Ghazi, MD   Echocardiographer:              Thurmon Fair, MD   Pre-operative Echo Findings: Severe aortic stenosis Normal left ventricular systolic function   Post-operative Echo Findings: No paravalvular leak Normal/unchanged left ventricular systolic function   ___________________________     Echo 08/20/22:  IMPRESSIONS  1. Left ventricular ejection fraction, by estimation, is 55 to 60%. The  left ventricle has normal function. The left ventricle demonstrates  regional wall motion abnormalities (see scoring diagram/findings for  description). Left ventricular diastolic  parameters are consistent with Grade II diastolic dysfunction  (pseudonormalization). Elevated left atrial pressure. There is moderate  hypokinesis of the left ventricular, basal-mid inferolateral wall.   2. Right ventricular systolic function is normal. The right ventricular  size is normal. There is mildly elevated pulmonary artery systolic  pressure.   3. Left atrial size was moderately dilated.   4. The mitral valve is normal in structure. Trivial mitral valve  regurgitation. Moderate mitral annular calcification.   5. The aortic valve has been repaired/replaced. Aortic valve  regurgitation is not visualized. There is a valve present in the aortic  position. Echo findings are consistent with normal structure and function  of the aortic valve prosthesis. Aortic valve  mean gradient measures 12.0 mmHg. Aortic valve Vmax measures 2.39 m/s.  Aortic valve acceleration time measures 85 msec.   6. The inferior vena cava is dilated in size with <50% respiratory  variability, suggesting right atrial  pressure of 15 mmHg.   Comparison(s): Prior images reviewed side by side. The left ventricular  function has improved.   ______________________  Echo 09/24/22 IMPRESSIONS   1. Left ventricular ejection fraction, by estimation, is 40 to 45%. The  left ventricle has mildly decreased function. The left ventricle  demonstrates global hypokinesis. Left ventricular diastolic function could  not be evaluated.   2. Right ventricular systolic function is normal. The right ventricular  size is normal. There is normal pulmonary artery systolic pressure.   3. Left atrial size was moderately dilated.   4. The mitral valve is normal in structure. Trivial mitral valve  regurgitation. No evidence of mitral stenosis.   5. The aortic valve has been repaired/replaced. Aortic valve  regurgitation is not visualized. There is a 26 mm Edwards Sapien  prosthetic (TAVR) valve present in the aortic position. Echo findings are  consistent with normal structure and function of the  aortic valve prosthesis. Aortic valve area, by VTI measures 1.65 cm.  Aortic valve mean gradient measures 8.8 mmHg.   6. The inferior vena cava is normal in size with greater than 50%  respiratory variability, suggesting right atrial pressure of 3 mmHg.   Comparison(s): Prior images reviewed side by side. Changes from prior  study are noted. EF reduced compared to echo from 08/20/22, but is similar  to echo from 08/10/22 (pre-TAVR).   Conclusion(s)/Recommendation(s): In atrial fibrillation with RVR  throughout study.   TEE 10/09/22: IMPRESSIONS     1. LV function appears severely depressed with global hypokinesis. . Left  ventricular ejection fraction, by estimation, is 25%. The left ventricle  has severely decreased function.   2. Right ventricular systolic function is severely reduced. The right  ventricular size is normal.   3. No left atrial/left atrial appendage thrombus was detected.   4. The mitral valve is normal in  structure. Mild mitral valve  regurgitation.   5. S/p TAVR (26 mm Edwards S3UR; procedure date 08/19/22) Leaflets open  well . The aortic valve has been repaired/replaced. Aortic valve  regurgitation is not visualized. There is a 26 mm Sapien prosthetic (TAVR)  valve present in the aortic position.  Procedure Date: 08/19/22.   6. There is mild (Grade II) plaque.   EKG:  EKG is ordered today.  This shows rapid afib with HR 130.   Recent Labs: 08/09/2022: B Natriuretic Peptide 486.3; TSH 2.157 08/18/2022: ALT 15 08/20/2022: Magnesium 2.2 10/01/2022: Hemoglobin 13.7; Platelets 170 10/16/2022: BUN 21; Creatinine, Ser 0.96; Potassium 4.2; Sodium 140  Recent Lipid Panel    Component Value Date/Time   CHOL 215 (H) 08/10/2022 2136   CHOL 200 (H) 03/21/2013 1142   TRIG 49 08/10/2022 2136   TRIG 192 (H) 03/21/2013 1142   HDL 45 08/10/2022 2136   HDL 31 (L) 03/21/2013 1142   CHOLHDL 4.8 08/10/2022 2136   VLDL 10 08/10/2022 2136   LDLCALC 160 (H) 08/10/2022 2136   LDLCALC 131 (H) 03/21/2013 1142     Risk Assessment/Calculations:       Physical Exam:    VS:  BP (!) 156/100   Pulse (!) 54   Ht 5\' 10"  (1.778 m)   Wt 147 lb 3.2 oz (66.8 kg)   SpO2 97%   BMI 21.12 kg/m     Wt Readings from Last 3 Encounters:  01/29/23 147 lb 3.2 oz (66.8 kg)  10/16/22 156 lb (70.8 kg)  10/10/22 158 lb 3.2 oz (71.8 kg)     GEN:  Well nourished, well developed in no acute distress HEENT: Normal NECK: No JVD LYMPHATICS: No lymphadenopathy CARDIAC: irreg irreg, tachy.  no murmurs, rubs, gallops RESPIRATORY:  Clear to auscultation without rales, wheezing or rhonchi  ABDOMEN: Soft, non-tender, non-distended MUSCULOSKELETAL:  No edema; No deformity  SKIN: Warm and dry. NEUROLOGIC:  Alert and oriented x 3 PSYCHIATRIC:  Normal affect   ASSESSMENT:    1. Persistent atrial fibrillation (HCC)   2. Encounter for therapeutic drug monitoring   3. S/P TAVR (transcatheter aortic valve replacement)    4. CAD S/P percutaneous coronary angioplasty   5. Aortic stenosis, severe   6. Chronic systolic CHF (congestive heart failure) (HCC)       PLAN:    In order of problems listed above:  Atrial fibrillation with RVR: s/p TEE guided DCCV in January. Has maintained NSR since then. On coumadin. INR therapeutic. On beta blocker  Severe AS s/p TAVR: valve stable.   HLD: continue ezetimibe 10mg  daily. LDL 160. He is statin intolerant (has been tried on at least 3 by previous PCP). Was referred  to our lipid clinic to consider PCSK9i but he has declined the referral.    CAD: underwent successful PCI with orbital atherectomy, PTCA, and placement of 3 overlapping drug-eluting stents from the proximal to mid LAD on 08/13/22. Continue Plavix and Coumadin. As above, statin intolerant.    Chronic Systolic CHF. EF significantly decreased on TEE to 25%. 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 Need to repeat Echo. Will schedule. If EF still low will need to consider Entresto/SGLT 2 inhibitor.    Signed, Anddy Wingert [place], MD  01/29/2023 4:20 PM    [facility]

## 2023-01-29 ENCOUNTER — Ambulatory Visit: Payer: Medicare Other | Attending: Cardiology | Admitting: Cardiology

## 2023-01-29 ENCOUNTER — Encounter: Payer: Self-pay | Admitting: Cardiology

## 2023-01-29 VITALS — BP 156/100 | HR 54 | Ht 70.0 in | Wt 147.2 lb

## 2023-01-29 DIAGNOSIS — I2510 Atherosclerotic heart disease of native coronary artery without angina pectoris: Secondary | ICD-10-CM | POA: Diagnosis not present

## 2023-01-29 DIAGNOSIS — I350 Nonrheumatic aortic (valve) stenosis: Secondary | ICD-10-CM | POA: Insufficient documentation

## 2023-01-29 DIAGNOSIS — I4819 Other persistent atrial fibrillation: Secondary | ICD-10-CM | POA: Diagnosis not present

## 2023-01-29 DIAGNOSIS — Z9861 Coronary angioplasty status: Secondary | ICD-10-CM | POA: Insufficient documentation

## 2023-01-29 DIAGNOSIS — Z952 Presence of prosthetic heart valve: Secondary | ICD-10-CM | POA: Diagnosis not present

## 2023-01-29 DIAGNOSIS — Z5181 Encounter for therapeutic drug level monitoring: Secondary | ICD-10-CM | POA: Diagnosis not present

## 2023-01-29 DIAGNOSIS — I5022 Chronic systolic (congestive) heart failure: Secondary | ICD-10-CM | POA: Insufficient documentation

## 2023-01-29 NOTE — Patient Instructions (Signed)
Medication Instructions:  Continue same medications *If you need a refill on your cardiac medications before your next appointment, please call your pharmacy*   Lab Work: None ordered   Testing/Procedures: Echo   Follow-Up: At [facility], you and your health needs are our priority.  As part of our continuing mission to provide you with exceptional heart care, we have created designated Provider Care Teams.  These Care Teams include your primary Cardiologist (physician) and Advanced Practice Providers (APPs -  Physician Assistants and Nurse Practitioners) who all work together to provide you with the care you need, when you need it.  We recommend signing up for the patient portal called "MyChart".  Sign up information is provided on this After Visit Summary.  MyChart is used to connect with patients for Virtual Visits (Telemedicine).  Patients are able to view lab/test results, encounter notes, upcoming appointments, etc.  Non-urgent messages can be sent to your provider as well.   To learn more about what you can do with MyChart, go to [email or web address].    Your next appointment:  6 months    Provider:  Dr.Jordan

## 2023-03-02 ENCOUNTER — Ambulatory Visit: Payer: Medicare Other | Attending: Physician Assistant | Admitting: *Deleted

## 2023-03-02 DIAGNOSIS — I4891 Unspecified atrial fibrillation: Secondary | ICD-10-CM | POA: Diagnosis not present

## 2023-03-02 DIAGNOSIS — Z5181 Encounter for therapeutic drug level monitoring: Secondary | ICD-10-CM | POA: Insufficient documentation

## 2023-03-02 DIAGNOSIS — Z952 Presence of prosthetic heart valve: Secondary | ICD-10-CM | POA: Insufficient documentation

## 2023-03-02 LAB — POCT INR: INR: 2.6 (ref 2.0–3.0)

## 2023-03-02 NOTE — Patient Instructions (Signed)
Continue warfarin 1 tablet daily except 1/2 tablet on Sundays and Thursdays  Continue greens Recheck in 6 weeks 

## 2023-03-05 ENCOUNTER — Ambulatory Visit (HOSPITAL_COMMUNITY): Payer: Medicare Other | Attending: Cardiology

## 2023-03-05 DIAGNOSIS — I4819 Other persistent atrial fibrillation: Secondary | ICD-10-CM | POA: Insufficient documentation

## 2023-03-07 LAB — ECHOCARDIOGRAM COMPLETE
AR max vel: 2.36 cm2
AV Area VTI: 2.22 cm2
AV Area mean vel: 2.16 cm2
AV Mean grad: 9 mmHg
AV Peak grad: 16.2 mmHg
Ao pk vel: 2.01 m/s
Area-P 1/2: 2.36 cm2
S' Lateral: 1.9 cm

## 2023-04-02 ENCOUNTER — Other Ambulatory Visit: Payer: Self-pay | Admitting: Cardiology

## 2023-04-02 DIAGNOSIS — I4891 Unspecified atrial fibrillation: Secondary | ICD-10-CM

## 2023-04-13 ENCOUNTER — Ambulatory Visit: Payer: Medicare Other | Attending: Physician Assistant | Admitting: *Deleted

## 2023-04-13 DIAGNOSIS — Z5181 Encounter for therapeutic drug level monitoring: Secondary | ICD-10-CM | POA: Insufficient documentation

## 2023-04-13 DIAGNOSIS — I4891 Unspecified atrial fibrillation: Secondary | ICD-10-CM | POA: Insufficient documentation

## 2023-04-13 DIAGNOSIS — Z952 Presence of prosthetic heart valve: Secondary | ICD-10-CM | POA: Diagnosis not present

## 2023-04-13 LAB — POCT INR: INR: 1.9 — AB (ref 2.0–3.0)

## 2023-04-13 NOTE — Patient Instructions (Signed)
Take warfarin 1 1/2 tablets tonight then resume 1 tablet daily except 1/2 tablet on Sundays and Thursdays  Continue greens Recheck in 6 weeks

## 2023-05-13 ENCOUNTER — Inpatient Hospital Stay (HOSPITAL_COMMUNITY)
Admission: EM | Admit: 2023-05-13 | Discharge: 2023-05-15 | DRG: 378 | Disposition: A | Discharge status: Home / Self Care | Payer: Medicare Other | Attending: Internal Medicine | Admitting: Internal Medicine

## 2023-05-13 ENCOUNTER — Other Ambulatory Visit: Payer: Self-pay

## 2023-05-13 ENCOUNTER — Encounter (HOSPITAL_COMMUNITY): Payer: Self-pay

## 2023-05-13 DIAGNOSIS — K649 Unspecified hemorrhoids: Secondary | ICD-10-CM | POA: Diagnosis present

## 2023-05-13 DIAGNOSIS — Z87891 Personal history of nicotine dependence: Secondary | ICD-10-CM | POA: Diagnosis not present

## 2023-05-13 DIAGNOSIS — I252 Old myocardial infarction: Secondary | ICD-10-CM | POA: Diagnosis not present

## 2023-05-13 DIAGNOSIS — K625 Hemorrhage of anus and rectum: Secondary | ICD-10-CM | POA: Diagnosis not present

## 2023-05-13 DIAGNOSIS — Z7901 Long term (current) use of anticoagulants: Secondary | ICD-10-CM

## 2023-05-13 DIAGNOSIS — K5900 Constipation, unspecified: Secondary | ICD-10-CM | POA: Diagnosis present

## 2023-05-13 DIAGNOSIS — Z833 Family history of diabetes mellitus: Secondary | ICD-10-CM

## 2023-05-13 DIAGNOSIS — K219 Gastro-esophageal reflux disease without esophagitis: Secondary | ICD-10-CM | POA: Diagnosis present

## 2023-05-13 DIAGNOSIS — Z8249 Family history of ischemic heart disease and other diseases of the circulatory system: Secondary | ICD-10-CM | POA: Diagnosis not present

## 2023-05-13 DIAGNOSIS — I255 Ischemic cardiomyopathy: Secondary | ICD-10-CM | POA: Diagnosis present

## 2023-05-13 DIAGNOSIS — Z953 Presence of xenogenic heart valve: Secondary | ICD-10-CM

## 2023-05-13 DIAGNOSIS — I2510 Atherosclerotic heart disease of native coronary artery without angina pectoris: Secondary | ICD-10-CM | POA: Diagnosis present

## 2023-05-13 DIAGNOSIS — I214 Non-ST elevation (NSTEMI) myocardial infarction: Secondary | ICD-10-CM | POA: Diagnosis present

## 2023-05-13 DIAGNOSIS — Z79899 Other long term (current) drug therapy: Secondary | ICD-10-CM

## 2023-05-13 DIAGNOSIS — Z7902 Long term (current) use of antithrombotics/antiplatelets: Secondary | ICD-10-CM | POA: Diagnosis not present

## 2023-05-13 DIAGNOSIS — Z881 Allergy status to other antibiotic agents status: Secondary | ICD-10-CM

## 2023-05-13 DIAGNOSIS — I10 Essential (primary) hypertension: Secondary | ICD-10-CM | POA: Diagnosis present

## 2023-05-13 DIAGNOSIS — I4891 Unspecified atrial fibrillation: Secondary | ICD-10-CM | POA: Diagnosis present

## 2023-05-13 DIAGNOSIS — M1990 Unspecified osteoarthritis, unspecified site: Secondary | ICD-10-CM | POA: Diagnosis present

## 2023-05-13 DIAGNOSIS — E871 Hypo-osmolality and hyponatremia: Secondary | ICD-10-CM | POA: Diagnosis present

## 2023-05-13 DIAGNOSIS — I4819 Other persistent atrial fibrillation: Secondary | ICD-10-CM | POA: Diagnosis present

## 2023-05-13 DIAGNOSIS — Z952 Presence of prosthetic heart valve: Secondary | ICD-10-CM

## 2023-05-13 DIAGNOSIS — E785 Hyperlipidemia, unspecified: Secondary | ICD-10-CM | POA: Diagnosis present

## 2023-05-13 DIAGNOSIS — Z955 Presence of coronary angioplasty implant and graft: Secondary | ICD-10-CM | POA: Diagnosis not present

## 2023-05-13 DIAGNOSIS — Z888 Allergy status to other drugs, medicaments and biological substances status: Secondary | ICD-10-CM | POA: Diagnosis not present

## 2023-05-13 LAB — CBC
HCT: 40 % (ref 39.0–52.0)
Hemoglobin: 13.7 g/dL (ref 13.0–17.0)
MCH: 33.3 pg (ref 26.0–34.0)
MCHC: 34.3 g/dL (ref 30.0–36.0)
MCV: 97.3 fL (ref 80.0–100.0)
Platelets: 184 10*3/uL (ref 150–400)
RBC: 4.11 MIL/uL — ABNORMAL LOW (ref 4.22–5.81)
RDW: 12.8 % (ref 11.5–15.5)
WBC: 6 10*3/uL (ref 4.0–10.5)
nRBC: 0 % (ref 0.0–0.2)

## 2023-05-13 LAB — CBC WITH DIFFERENTIAL/PLATELET
Abs Immature Granulocytes: 0.01 10*3/uL (ref 0.00–0.07)
Basophils Absolute: 0.1 10*3/uL (ref 0.0–0.1)
Basophils Relative: 1 %
Eosinophils Absolute: 0.2 10*3/uL (ref 0.0–0.5)
Eosinophils Relative: 4 %
HCT: 39.5 % (ref 39.0–52.0)
Hemoglobin: 13.7 g/dL (ref 13.0–17.0)
Immature Granulocytes: 0 %
Lymphocytes Relative: 31 %
Lymphs Abs: 1.6 10*3/uL (ref 0.7–4.0)
MCH: 33.3 pg (ref 26.0–34.0)
MCHC: 34.7 g/dL (ref 30.0–36.0)
MCV: 96.1 fL (ref 80.0–100.0)
Monocytes Absolute: 0.5 10*3/uL (ref 0.1–1.0)
Monocytes Relative: 9 %
Neutro Abs: 2.9 10*3/uL (ref 1.7–7.7)
Neutrophils Relative %: 55 %
Platelets: 193 10*3/uL (ref 150–400)
RBC: 4.11 MIL/uL — ABNORMAL LOW (ref 4.22–5.81)
RDW: 12.8 % (ref 11.5–15.5)
WBC: 5.2 10*3/uL (ref 4.0–10.5)
nRBC: 0 % (ref 0.0–0.2)

## 2023-05-13 LAB — IRON AND TIBC
Iron: 149 ug/dL (ref 45–182)
Saturation Ratios: 65 % — ABNORMAL HIGH (ref 17.9–39.5)
TIBC: 229 ug/dL — ABNORMAL LOW (ref 250–450)
UIBC: 80 ug/dL

## 2023-05-13 LAB — COMPREHENSIVE METABOLIC PANEL
ALT: 18 U/L (ref 0–44)
AST: 22 U/L (ref 15–41)
Albumin: 3.4 g/dL — ABNORMAL LOW (ref 3.5–5.0)
Alkaline Phosphatase: 87 U/L (ref 38–126)
Anion gap: 9 (ref 5–15)
BUN: 16 mg/dL (ref 8–23)
CO2: 24 mmol/L (ref 22–32)
Calcium: 8.5 mg/dL — ABNORMAL LOW (ref 8.9–10.3)
Chloride: 101 mmol/L (ref 98–111)
Creatinine, Ser: 0.75 mg/dL (ref 0.61–1.24)
GFR, Estimated: >60 mL/min (ref >60)
Glucose, Bld: 98 mg/dL (ref 70–99)
Potassium: 4 mmol/L (ref 3.5–5.1)
Sodium: 134 mmol/L — ABNORMAL LOW (ref 135–145)
Total Bilirubin: 1.4 mg/dL — ABNORMAL HIGH (ref 0.3–1.2)
Total Protein: 6.6 g/dL (ref 6.5–8.1)

## 2023-05-13 LAB — VITAMIN B12: Vitamin B-12: 349 pg/mL (ref 180–914)

## 2023-05-13 LAB — FERRITIN: Ferritin: 709 ng/mL — ABNORMAL HIGH (ref 24–336)

## 2023-05-13 LAB — RETICULOCYTES
Immature Retic Fract: 5.3 % (ref 2.3–15.9)
RBC.: 4.09 MIL/uL — ABNORMAL LOW (ref 4.22–5.81)
Retic Count, Absolute: 38 10*3/uL (ref 19.0–186.0)
Retic Ct Pct: 0.9 % (ref 0.4–3.1)

## 2023-05-13 LAB — PROTIME-INR
INR: 2.7 — ABNORMAL HIGH (ref 0.8–1.2)
Prothrombin Time: 29.1 seconds — ABNORMAL HIGH (ref 11.4–15.2)

## 2023-05-13 LAB — HEMOGLOBIN AND HEMATOCRIT, BLOOD
HCT: 39.8 % (ref 39.0–52.0)
Hemoglobin: 13.5 g/dL (ref 13.0–17.0)

## 2023-05-13 LAB — TYPE AND SCREEN
ABO/RH(D): O POS
Antibody Screen: NEGATIVE

## 2023-05-13 LAB — FOLATE: Folate: 13.2 ng/mL (ref >5.9)

## 2023-05-13 MED ORDER — PHYTONADIONE 5 MG PO TABS
2.5000 mg | ORAL_TABLET | Freq: Once | ORAL | Status: AC
  Administered 2023-05-13: 2.5 mg via ORAL
  Filled 2023-05-13: qty 1

## 2023-05-13 MED ORDER — HYDRALAZINE HCL 20 MG/ML IJ SOLN: 5.0000 mg | INTRAMUSCULAR | Status: DC | PRN

## 2023-05-13 MED ORDER — POLYETHYLENE GLYCOL 3350 17 G PO PACK: 17.0000 g | PACK | Freq: Every day | ORAL | Status: DC | PRN

## 2023-05-13 MED ORDER — ONDANSETRON HCL 4 MG/2ML IJ SOLN: 4.0000 mg | Freq: Four times a day (QID) | INTRAMUSCULAR | Status: DC | PRN

## 2023-05-13 MED ORDER — ACETAMINOPHEN 650 MG RE SUPP: 650.0000 mg | Freq: Four times a day (QID) | RECTAL | Status: DC | PRN

## 2023-05-13 MED ORDER — ACETAMINOPHEN 325 MG PO TABS: 650.0000 mg | ORAL_TABLET | Freq: Four times a day (QID) | ORAL | Status: DC | PRN

## 2023-05-13 MED ORDER — ONDANSETRON HCL 4 MG PO TABS: 4.0000 mg | ORAL_TABLET | Freq: Four times a day (QID) | ORAL | Status: DC | PRN

## 2023-05-13 MED ORDER — SODIUM CHLORIDE 0.9 % IV SOLN: INTRAVENOUS | Status: AC

## 2023-05-13 MED ORDER — METOPROLOL SUCCINATE ER 50 MG PO TB24: 50.0000 mg | ORAL_TABLET | Freq: Every day | ORAL | Status: DC

## 2023-05-13 MED ORDER — PANTOPRAZOLE SODIUM 40 MG IV SOLR
40.0000 mg | INTRAVENOUS | Status: DC
  Administered 2023-05-13 – 2023-05-14 (×2): 40 mg via INTRAVENOUS
  Filled 2023-05-13 (×2): qty 10

## 2023-05-13 NOTE — Assessment & Plan Note (Signed)
Rate Controlled.  History of TAVR, but he is on anticoagulation for atrial fibrillation.  He was initially on Eliquis, but due to cost issues, he was converted to Coumadin. -Resume metoprolol -Hold Coumadin

## 2023-05-13 NOTE — Progress Notes (Signed)
Pt iarrived to room via [facility] from ED. Pt ambulated unassisted fro WC to toilet. Pts had bright red bleeding from rectum while on toilet, small amount. Pt cleaned and back to bed.   Current b/p 178/86 and heart rate is 47. MD Emokpae notified of rectal bleeding and above VS. Advises to hold scheduled dose of Metoprolol due to low HR and use PRN hydralazine for systolic b/p > 180. Pt and son notified of plans to hold metoprolol and rationale, both state understanding.  Pt oriented to room and safety procedures. Call bell within reach. Pt advised to call for needs, states understanding.

## 2023-05-13 NOTE — Assessment & Plan Note (Signed)
Elevated. - Hold metoprolol, heart rate now down to 48 - PRN IV Hydralazine 5mg  for systolic > 180

## 2023-05-13 NOTE — ED Notes (Addendum)
Pt given phone to call wife.

## 2023-05-13 NOTE — Assessment & Plan Note (Addendum)
2 episodes of painless rectal bleeding, hemoglobin- 13.7 and vitals stable.Has hemorrhoids.  On anticoagulation with warfarin for atrial fibrillation, and Plavix.  INR in therapeutic range at 2.7.  Last Coumadin dose was last night. - EDP talked to Dr. Suann Larry- holding Coumadin, switch to heparin at hospitalist direction, will see in consult, if unstable may need to be reversed. -Trend hemoglobin -Hold anticoagulation with Coumadin and Plavix -PO vitamin K 2.5 mg x 1 - N/s 75cc/hr x 15hrs -IV Protonix 40 daily -PT/INR in a.m. Addendum- I was called that patient is still bleeding per rectum, he is worried and insists on being transferred to [facility]. I called and talked to both patient and his Son Lyda Jester- I have tried to re-assure patient, explained that his hemoglobin and vitals are stable, his systolic is in the 160s, he is not dizzy , no chest pain, no difficulty breathing.  He has received p.o. vitamin K 2.5 mg. -Bed request for [place] placed. -Secure chat message sent to Dr. Marina Goodell on-call for GI at [facility] to see patient in consult, I called [place] number listed on Amion- Unfortunately got patient answering service only.  - May need to re-consult GI in a.m

## 2023-05-13 NOTE — ED Notes (Addendum)
Pt states, at baseline, he walks without any assistive devices such as cane, walker, or wheelchair. Pt is also A & O x 4

## 2023-05-13 NOTE — H&P (Addendum)
History and Physical    Jonathan Romero JXB:[id number] DOB: 02/07/1946 DOA: 05/13/2023  PCP: Assunta Found, MD   Patient coming from: Home  I have personally briefly reviewed patient's old medical records in [facility] Link  Chief Complaint: Rectal Bleeding  HPI: Jonathan Romero is a 77 y.o. male with medical history significant for TAVR, NSTEMI, atrial fibrillation, CAD, cardiomyopathy.  Patient presented to the ED with complaints of bleeding per rectum.  Reports 2 episodes today.  Initially with dark blood, later with bright red blood.  Bleeding was painless.  He has hemorrhoids.  No abdominal pain.  No vomiting of blood.  Denies prior episodes of bleeding.  He has never had a colonoscopy, stool cards which he sends in to his provider instead.  ED Course: Temperature 98, heart rate 50-54, respiratory 18-19, blood pressure systolic 150s to 130Q.  Hemoglobin stable at 13.7.  INR 2.7. Rectal exam done in ED, with findings of large amount of blood in rectal vault, no stool with internal and external engorged non-tender hemorrhoids.  EDP Dr. Sandria Manly Dr. Garth Bigness, hold Coumadin, switch to heparin at hospitalist direction, will see in consult, if unstable may need to be reversed.  Review of Systems: As per HPI all other systems reviewed and negative.  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 Excell Seltzer & Weldner   Severe aortic stenosis     Past Surgical History:  Procedure Laterality  Date   CARDIOVERSION N/A 10/09/2022   Procedure: CARDIOVERSION;  Surgeon: Pricilla Riffle, MD;  Location: [facility] ENDOSCOPY;  Service: Cardiovascular;  Laterality: N/A;   CATARACT EXTRACTION     CORONARY ATHERECTOMY N/A 08/13/2022   Procedure: CORONARY ATHERECTOMY;  Surgeon: Tonny Bollman, MD;  Location: [facility] INVASIVE CV LAB;  Service: Cardiovascular;  Laterality: N/A;   CORONARY STENT INTERVENTION N/A 08/13/2022   Procedure: CORONARY STENT INTERVENTION;  Surgeon: Tonny Bollman, MD;  Location: [facility] INVASIVE CV LAB;  Service: Cardiovascular;  Laterality: N/A;   DOPPLER ECHOCARDIOGRAPHY  12/21/2012   EF 55-60%,   DOPPLER ECHOCARDIOGRAPHY  12/16/2011   MILD AORTIC STENOSIS,PEAK AND MEAN GRADIENTS OF 18 AND 8 mmHg and a valve area of around 2 square cm.    INTRAOPERATIVE TRANSTHORACIC ECHOCARDIOGRAM N/A 08/19/2022   Procedure: INTRAOPERATIVE TRANSTHORACIC ECHOCARDIOGRAM;  Surgeon: Tonny Bollman, MD;  Location: [facility] OR;  Service: Open Heart Surgery;  Laterality: N/A;   RIGHT/LEFT HEART CATH AND CORONARY ANGIOGRAPHY N/A 08/11/2022   Procedure: RIGHT/LEFT HEART CATH AND CORONARY ANGIOGRAPHY;  Surgeon: Lyn Records, MD;  Location: [facility] INVASIVE CV LAB;  Service: Cardiovascular;  Laterality: N/A;   TEE WITHOUT CARDIOVERSION N/A 10/09/2022   Procedure: TRANSESOPHAGEAL ECHOCARDIOGRAM (TEE);  Surgeon: Pricilla Riffle, MD;  Location: [facility] ENDOSCOPY;  Service: Cardiovascular;  Laterality: N/A;   TRANSCATHETER AORTIC VALVE REPLACEMENT, TRANSFEMORAL N/A 08/19/2022   Procedure: Transcatheter Aortic Valve Replacement, Transfemoral using a 26 MM Edwards SAPIEN 3 Ultra;  Surgeon: Tonny Bollman, MD;  Location:  [facility] OR;  Service: Open Heart Surgery;  Laterality: N/A;  Transfemoral     reports that he quit smoking about 40 years ago. His smoking use included cigarettes. He has never used smokeless tobacco. He reports current alcohol use. He reports that he does not use drugs.  Allergies  Allergen Reactions   Doxycycline Other  (See Comments)    Joint pain   Statins     Joint pain    Family History  Problem Relation Age of Onset   Diabetes Mother    Heart failure Father    Prior to Admission medications   Medication Sig Start Date End Date Taking? Authorizing Provider  amoxicillin (AMOXIL) 500 MG tablet Take 4 tablets (2,000 mg total) by mouth as directed. 1 hour prior to dental work including cleanings 08/27/22   Janetta Hora, PA-C  clopidogrel (PLAVIX) 75 MG tablet Take 1 tablet (75 mg total) by mouth daily. 08/14/22   [facility], Roe Rutherford, PA  ezetimibe (ZETIA) 10 MG tablet Take 1 tablet (10 mg total) by mouth daily. 08/14/22   [facility], Roe Rutherford, PA  furosemide (LASIX) 20 MG tablet Take 1 tablet (20 mg total) by mouth daily. 10/06/22   Janetta Hora, PA-C  hydrocortisone cream 1 % Apply 1 Application topically daily as needed (cracked skin).    [provider]  metoprolol succinate (TOPROL-XL) 50 MG 24 hr tablet Take 1 tablet (50 mg total) by mouth daily. Take with or immediately following a meal. 10/10/22   Janetta Hora, PA-C  nitroGLYCERIN (NITROSTAT) 0.4 MG SL tablet Place 1 tablet (0.4 mg total) under the tongue every 5 (five) minutes x 3 doses as needed for chest pain. 08/14/22   [facility], Roe Rutherford, PA  warfarin (COUMADIN) 5 MG tablet TAKE 1 TABLET BY MOUTH ONCE DAILY OR AS DIRECTED BY ANTICOAGULATION CLINIC. 04/02/23   [place], Peter M, MD    Physical Exam: Vitals:   05/13/23 1423 05/13/23 1530 05/13/23 1545 05/13/23 1600  BP: (!) 181/92 (!) 161/76 (!) 152/63 (!) 172/82  Pulse: (!) 54 (!) 53 (!) 53 (!) 50  Resp: 18  19   Temp: 98 F (36.7 C)     TempSrc: Oral     SpO2: 100% 100% 99% 100%  Weight:      Height:        Constitutional: NAD, calm, comfortable Vitals:   05/13/23 1423 05/13/23 1530 05/13/23 1545 05/13/23 1600  BP: (!) 181/92 (!) 161/76 (!) 152/63 (!) 172/82  Pulse: (!) 54 (!) 53 (!) 53 (!) 50  Resp: 18  19   Temp: 98 F (36.7 C)     TempSrc: Oral      SpO2: 100% 100% 99% 100%  Weight:      Height:       Eyes: PERRL, lids and conjunctivae normal ENMT: Mucous membranes are moist.  Neck: normal, supple, no masses, no thyromegaly Respiratory: clear to auscultation bilaterally, no wheezing, no crackles. Normal respiratory effort. No accessory muscle use.  Cardiovascular: Regular rate and rhythm, no murmurs / rubs / gallops. No extremity edema.  Extremities warm. Abdomen: no tenderness, no masses palpated. No hepatosplenomegaly. Bowel sounds positive.  Musculoskeletal: no clubbing / cyanosis. No joint deformity upper and lower extremities.  Skin: no rashes, lesions, ulcers. No induration Neurologic: .  No facial asymmetry, moving extremities spontaneously, speech fluent. Psychiatric: Normal judgment and insight. Alert and oriented x 3. Normal mood.   Labs on Admission: I have personally reviewed following labs and imaging  studies  CBC: Recent Labs  Lab 05/13/23 1525  WBC 5.2  NEUTROABS 2.9  HGB 13.7  HCT 39.5  MCV 96.1  PLT 193   Basic Metabolic Panel: Recent Labs  Lab 05/13/23 1525  NA 134*  K 4.0  CL 101  CO2 24  GLUCOSE 98  BUN 16  CREATININE 0.75  CALCIUM 8.5*   GFR: Estimated Creatinine Clearance: 69.7 mL/min (by C-G formula based on SCr of 0.75 mg/dL). Liver Function Tests: Recent Labs  Lab 05/13/23 1525  AST 22  ALT 18  ALKPHOS 87  BILITOT 1.4*  PROT 6.6  ALBUMIN 3.4*   Coagulation Profile: Recent Labs  Lab 05/13/23 1525  INR 2.7*   Anemia Panel: Recent Labs    05/13/23 1525  RETICCTPCT 0.9   Urine analysis:    Component Value Date/Time   COLORURINE YELLOW 08/18/2022 0832   APPEARANCEUR CLEAR 08/18/2022 0832   LABSPEC 1.018 08/18/2022 0832   PHURINE 5.0 08/18/2022 0832   GLUCOSEU NEGATIVE 08/18/2022 0832   HGBUR NEGATIVE 08/18/2022 0832   BILIRUBINUR NEGATIVE 08/18/2022 0832   KETONESUR NEGATIVE 08/18/2022 0832   PROTEINUR NEGATIVE 08/18/2022 0832   NITRITE NEGATIVE 08/18/2022 0832    LEUKOCYTESUR NEGATIVE 08/18/2022 0832    Radiological Exams on Admission: No results found.  EKG: None  Assessment/Plan Principal Problem:   Rectal bleeding Active Problems:   NSTEMI (non-ST elevated myocardial infarction) (HCC)   Atrial fibrillation (HCC)   S/P TAVR (transcatheter aortic valve replacement)   HTN (hypertension)  Assessment and Plan: * Rectal bleeding 2 episodes of painless rectal bleeding, hemoglobin- 13.7 and vitals stable.Has hemorrhoids.  On anticoagulation with warfarin for atrial fibrillation, and Plavix.  INR in therapeutic range at 2.7.  Last Coumadin dose was last night. - EDP talked to Dr. Suann Larry- holding Coumadin, switch to heparin at hospitalist direction, will see in consult, if unstable may need to be reversed. -Trend hemoglobin -Hold anticoagulation with Coumadin and Plavix -PO vitamin K 2.5 mg x 1 - N/s 75cc/hr x 15hrs -IV Protonix 40 daily -PT/INR in a.m. Addendum- I was called that patient is still bleeding per rectum, he is worried and insists on being transferred to [facility]. I called and talked to both patient and his Son Jonathan Romero- I have tried to re-assure patient, explained that his hemoglobin and vitals are stable, his systolic is in the 160s, he is not dizzy , no chest pain, no difficulty breathing.  He has received p.o. vitamin K 2.5 mg. -Bed request for [place] placed. -Secure chat message sent to Dr. Marina Goodell on-call for GI at [facility] to see patient in consult, I called [place] number listed on Amion- Unfortunately got patient answering service only.  - May need to re-consult GI in a.m  HTN (hypertension) Elevated. - Hold metoprolol, heart rate now down to 48 - PRN IV Hydralazine 5mg  for systolic > 180  Atrial fibrillation (HCC) Rate Controlled.  History of TAVR, but he is on anticoagulation for atrial fibrillation.  He was initially on Eliquis, but due to cost issues, he was converted to Coumadin. -Resume  metoprolol -Hold Coumadin  NSTEMI (non-ST elevated myocardial infarction) (HCC) History of coronary artery disease with LAD PCI/DES x 3 07/2022.  Also with history of ischemic cardiomyopathy, hx of low EF initially 25%, improved to 60 to 65%-  02/2023. -Resume Plavix - Heart rate now down to 48, hold metoprolol -Hold Lasix for now   DVT prophylaxis: SCDS Code Status: FULL Code Family Communication: None at  bedside Disposition Plan: ~ 2 days Consults called:GI Admission status:  Obs tele    Author: Onnie Boer, MD 05/13/2023 12:01 AM  For on call review [email or web address].

## 2023-05-13 NOTE — ED Triage Notes (Signed)
Pt arrives via POV with c/o rectal bleeding that started about 45 minutes ago and has been unable to get the bleeding to stop. Pt states he has internal and external hemorrhoids and is on a blood thinner

## 2023-05-13 NOTE — Assessment & Plan Note (Addendum)
History of coronary artery disease with LAD PCI/DES x 3 07/2022.  Also with history of ischemic cardiomyopathy, hx of low EF initially 25%, improved to 60 to 65%-  02/2023. -Resume Plavix - Heart rate now down to 48, hold metoprolol -Hold Lasix for now

## 2023-05-13 NOTE — ED Provider Notes (Signed)
[place] EMERGENCY DEPARTMENT AT [facility] Provider Note   CSN: [id number] Arrival date & time: 05/13/23  1359     History {Add pertinent medical, surgical, social history, OB history to HPI:1} Chief Complaint  Patient presents with   Rectal Bleeding    Jonathan Romero is a 77 y.o. male.   Rectal Bleeding  This is a very pleasant 77 year old male with a history of valve replacement of his aortic valve on Coumadin, history of hypertension on metoprolol, he also takes Plavix, he presents today with a complaint of rectal bleeding which is quite heavy and occurred twice.  He reports having a brown bowel movement earlier in the day but subsequently had 2 bowel movements where he filled the toilet with dark red blood.  He has not had any shortness of breath or weakness, he has not had any lightheadedness palpitations or nausea.  He denies abdominal discomfort, he is unsure about his last INR however his last Coumadin dose was last night he takes it at night.  Review of the medical record shows that the patient has not had any colonoscopies within the system, care everywhere reviewed, no outside records available regarding gastroenterology    Home Medications Prior to Admission medications   Medication Sig Start Date End Date Taking? Authorizing Provider  amoxicillin (AMOXIL) 500 MG tablet Take 4 tablets (2,000 mg total) by mouth as directed. 1 hour prior to dental work including cleanings 08/27/22   Janetta Hora, PA-C  clopidogrel (PLAVIX) 75 MG tablet Take 1 tablet (75 mg total) by mouth daily. 08/14/22   [facility], Roe Rutherford, PA  ezetimibe (ZETIA) 10 MG tablet Take 1 tablet (10 mg total) by mouth daily. 08/14/22   [facility], Roe Rutherford, PA  furosemide (LASIX) 20 MG tablet Take 1 tablet (20 mg total) by mouth daily. 10/06/22   Janetta Hora, PA-C  hydrocortisone cream 1 % Apply 1 Application topically daily as needed (cracked skin).    [provider]   metoprolol succinate (TOPROL-XL) 50 MG 24 hr tablet Take 1 tablet (50 mg total) by mouth daily. Take with or immediately following a meal. 10/10/22   Janetta Hora, PA-C  nitroGLYCERIN (NITROSTAT) 0.4 MG SL tablet Place 1 tablet (0.4 mg total) under the tongue every 5 (five) minutes x 3 doses as needed for chest pain. 08/14/22   [facility], Roe Rutherford, PA  warfarin (COUMADIN) 5 MG tablet TAKE 1 TABLET BY MOUTH ONCE DAILY OR AS DIRECTED BY ANTICOAGULATION CLINIC. 04/02/23   [place], Peter M, MD      Allergies    Doxycycline and Statins    Review of Systems   Review of Systems  Gastrointestinal:  Positive for hematochezia.  All other systems reviewed and are negative.   Physical Exam Updated Vital Signs BP (!) 152/63 (BP Location: Right Arm)   Pulse (!) 53   Temp 98 F (36.7 C) (Oral)   Resp 19   Ht 1.778 m (5\' 10" )   Wt 65.8 kg   SpO2 99%   BMI 20.81 kg/m  Physical Exam Vitals and nursing note reviewed.  Constitutional:      General: He is not in acute distress.    Appearance: He is well-developed.  HENT:     Head: Normocephalic and atraumatic.     Mouth/Throat:     Pharynx: No oropharyngeal exudate.  Eyes:     General: No scleral icterus.       Right eye: No discharge.  Left eye: No discharge.     Conjunctiva/sclera: Conjunctivae normal.     Pupils: Pupils are equal, round, and reactive to light.  Neck:     Thyroid: No thyromegaly.     Vascular: No JVD.  Cardiovascular:     Rate and Rhythm: Normal rate and regular rhythm.     Heart sounds: Murmur heard.     No friction rub. No gallop.  Pulmonary:     Effort: Pulmonary effort is normal. No respiratory distress.     Breath sounds: Normal breath sounds. No wheezing or rales.  Abdominal:     General: Bowel sounds are normal. There is no distension.     Palpations: Abdomen is soft. There is no mass.     Tenderness: There is no abdominal tenderness.     Comments: Soft and nontender abdomen  Genitourinary:     Comments: Chaperone present for exam, patient has what appears to be an large amount of dark red blood at the anus, rectal exam shows hemorrhoids, engorged, nontender, nothing actively bleeding, no fissure, no internal masses, no stool in the rectal vault Musculoskeletal:        General: No tenderness. Normal range of motion.     Cervical back: Normal range of motion and neck supple.  Lymphadenopathy:     Cervical: No cervical adenopathy.  Skin:    General: Skin is warm and dry.     Findings: No erythema or rash.  Neurological:     Mental Status: He is alert.     Coordination: Coordination normal.  Psychiatric:        Behavior: Behavior normal.     ED Results / Procedures / Treatments   Labs (all labs ordered are listed, but only abnormal results are displayed) Labs Reviewed  CBC WITH DIFFERENTIAL/PLATELET - Abnormal; Notable for the following components:      Result Value   RBC 4.11 (*)    All other components within normal limits  COMPREHENSIVE METABOLIC PANEL - Abnormal; Notable for the following components:   Sodium 134 (*)    Calcium 8.5 (*)    Albumin 3.4 (*)    Total Bilirubin 1.4 (*)    All other components within normal limits  PROTIME-INR - Abnormal; Notable for the following components:   Prothrombin Time 29.1 (*)    INR 2.7 (*)    All other components within normal limits  RETICULOCYTES - Abnormal; Notable for the following components:   RBC. 4.09 (*)    All other components within normal limits  VITAMIN B12  FOLATE  IRON AND TIBC  FERRITIN  TYPE AND SCREEN    EKG None  Radiology No results found.  Procedures Procedures  {Document cardiac monitor, telemetry assessment procedure when appropriate:1}  Medications Ordered in ED Medications - No data to display  ED Course/ Medical Decision Making/ A&P   {   Click here for ABCD2, HEART and other calculatorsREFRESH Note before signing :1}                              Medical Decision Making Amount  and/or Complexity of Data Reviewed Labs: ordered.  Risk Decision regarding hospitalization.    This patient presents to the ED for concern of rectal bleeding, this involves an extensive number of treatment options, and is a complaint that carries with it a high risk of complications and morbidity.  The differential diagnosis includes could be hemorrhoidal, would also consider  diverticulosis or AVM, unlikely to be peptic ulcer given the color of the blood and the acute onset, it is unclear whether he had truly brown stool as he states that there was a lot of blood with it.  There is no stool in the rectal vault at this time   Co morbidities that complicate the patient evaluation  The patient is on Coumadin making it difficult to know if he is coagulopathic, although assuredly he is, this will be checked with an INR   Additional history obtained:  Additional history obtained from medical record External records from outside source obtained and reviewed including prior cardiac workup, no gastrointestinal workup is found Multiple visits to cardiology in [facility] for his Coumadin level, history of persistent atrial fibrillation, admitted in January 2024 for the atrial fibrillation   Lab Tests:  I Ordered, and personally interpreted labs.  The pertinent results include: No anemia, INR is 2.7   Cardiac Monitoring: / EKG:  The patient was maintained on a cardiac monitor.  I personally viewed and interpreted the cardiac monitored which showed an underlying rhythm of: Normal heart rate   Consultations Obtained:  I requested consultation with the gastroenterologist Dr. Jena Gauss,  and discussed lab and imaging findings as well as pertinent plan - they recommend: Admission to the hospital to trend hemoglobin, stop Coumadin, switch to heparin at hospitalist direction, hemodynamically stable, they will see the patient in consultation, if patient becomes unstable need to be reversed Will discuss with  hospitalist for admission   Problem List / ED Course / Critical interventions / Medication management  Thankfully not anemic but has rectal bleeding of unknown etiology, agree with gastroenterology the patient should be admitted, hemoglobin trending, vital signs of the appear stable and the patient is in no distress  I have reviewed the patients home medicines and have made adjustments as needed   Social Determinants of Health:  Elderly, anticoagulated   Test / Admission - Considered:  Admit to hospital   {Document critical care time when appropriate:1} {Document review of labs and clinical decision tools ie heart score, Chads2Vasc2 etc:1}  {Document your independent review of radiology images, and any outside records:1} {Document your discussion with family members, caretakers, and with consultants:1} {Document social determinants of health affecting pt's care:1} {Document your decision making why or why not admission, treatments were needed:1} Final Clinical Impression(s) / ED Diagnoses Final diagnoses:  Rectal bleeding    Rx / DC Orders ED Discharge Orders     None

## 2023-05-14 DIAGNOSIS — M1990 Unspecified osteoarthritis, unspecified site: Secondary | ICD-10-CM | POA: Diagnosis present

## 2023-05-14 DIAGNOSIS — Z79899 Other long term (current) drug therapy: Secondary | ICD-10-CM | POA: Diagnosis not present

## 2023-05-14 DIAGNOSIS — K219 Gastro-esophageal reflux disease without esophagitis: Secondary | ICD-10-CM | POA: Diagnosis present

## 2023-05-14 DIAGNOSIS — Z87891 Personal history of nicotine dependence: Secondary | ICD-10-CM | POA: Diagnosis not present

## 2023-05-14 DIAGNOSIS — Z7902 Long term (current) use of antithrombotics/antiplatelets: Secondary | ICD-10-CM | POA: Diagnosis not present

## 2023-05-14 DIAGNOSIS — I10 Essential (primary) hypertension: Secondary | ICD-10-CM | POA: Diagnosis present

## 2023-05-14 DIAGNOSIS — Z8249 Family history of ischemic heart disease and other diseases of the circulatory system: Secondary | ICD-10-CM | POA: Diagnosis not present

## 2023-05-14 DIAGNOSIS — I252 Old myocardial infarction: Secondary | ICD-10-CM | POA: Diagnosis not present

## 2023-05-14 DIAGNOSIS — K625 Hemorrhage of anus and rectum: Secondary | ICD-10-CM | POA: Diagnosis present

## 2023-05-14 DIAGNOSIS — I2510 Atherosclerotic heart disease of native coronary artery without angina pectoris: Secondary | ICD-10-CM | POA: Diagnosis present

## 2023-05-14 DIAGNOSIS — E785 Hyperlipidemia, unspecified: Secondary | ICD-10-CM | POA: Diagnosis present

## 2023-05-14 DIAGNOSIS — K5900 Constipation, unspecified: Secondary | ICD-10-CM | POA: Diagnosis present

## 2023-05-14 DIAGNOSIS — E871 Hypo-osmolality and hyponatremia: Secondary | ICD-10-CM | POA: Diagnosis present

## 2023-05-14 DIAGNOSIS — Z955 Presence of coronary angioplasty implant and graft: Secondary | ICD-10-CM | POA: Diagnosis not present

## 2023-05-14 DIAGNOSIS — Z881 Allergy status to other antibiotic agents status: Secondary | ICD-10-CM | POA: Diagnosis not present

## 2023-05-14 DIAGNOSIS — K649 Unspecified hemorrhoids: Secondary | ICD-10-CM | POA: Diagnosis present

## 2023-05-14 DIAGNOSIS — Z833 Family history of diabetes mellitus: Secondary | ICD-10-CM | POA: Diagnosis not present

## 2023-05-14 DIAGNOSIS — Z888 Allergy status to other drugs, medicaments and biological substances status: Secondary | ICD-10-CM | POA: Diagnosis not present

## 2023-05-14 DIAGNOSIS — Z7901 Long term (current) use of anticoagulants: Secondary | ICD-10-CM

## 2023-05-14 DIAGNOSIS — I4891 Unspecified atrial fibrillation: Secondary | ICD-10-CM | POA: Diagnosis present

## 2023-05-14 DIAGNOSIS — I255 Ischemic cardiomyopathy: Secondary | ICD-10-CM | POA: Diagnosis present

## 2023-05-14 DIAGNOSIS — Z953 Presence of xenogenic heart valve: Secondary | ICD-10-CM | POA: Diagnosis not present

## 2023-05-14 LAB — CBC
HCT: 39.1 % (ref 39.0–52.0)
Hemoglobin: 13.5 g/dL (ref 13.0–17.0)
MCH: 33.8 pg (ref 26.0–34.0)
MCHC: 34.5 g/dL (ref 30.0–36.0)
MCV: 97.8 fL (ref 80.0–100.0)
Platelets: 175 10*3/uL (ref 150–400)
RBC: 4 MIL/uL — ABNORMAL LOW (ref 4.22–5.81)
RDW: 12.9 % (ref 11.5–15.5)
WBC: 5.1 10*3/uL (ref 4.0–10.5)
nRBC: 0 % (ref 0.0–0.2)

## 2023-05-14 LAB — BASIC METABOLIC PANEL
Anion gap: 7 (ref 5–15)
BUN: 10 mg/dL (ref 8–23)
CO2: 25 mmol/L (ref 22–32)
Calcium: 8.6 mg/dL — ABNORMAL LOW (ref 8.9–10.3)
Chloride: 105 mmol/L (ref 98–111)
Creatinine, Ser: 0.91 mg/dL (ref 0.61–1.24)
GFR, Estimated: >60 mL/min (ref >60)
Glucose, Bld: 89 mg/dL (ref 70–99)
Potassium: 4.1 mmol/L (ref 3.5–5.1)
Sodium: 137 mmol/L (ref 135–145)

## 2023-05-14 LAB — TYPE AND SCREEN
ABO/RH(D): O POS
Antibody Screen: NEGATIVE

## 2023-05-14 LAB — PROTIME-INR
INR: 1.9 — ABNORMAL HIGH (ref 0.8–1.2)
Prothrombin Time: 22.1 s — ABNORMAL HIGH (ref 11.4–15.2)

## 2023-05-14 MED ORDER — SODIUM CHLORIDE 0.9 % IV SOLN: INTRAVENOUS | Status: DC

## 2023-05-14 MED ORDER — HYDROCORTISONE ACETATE 25 MG RE SUPP
25.0000 mg | Freq: Two times a day (BID) | RECTAL | Status: DC
  Administered 2023-05-14 (×2): 25 mg via RECTAL
  Filled 2023-05-14 (×2): qty 1

## 2023-05-14 MED ORDER — SODIUM CHLORIDE 0.9% IV SOLUTION: Freq: Once | INTRAVENOUS | Status: DC

## 2023-05-14 MED ORDER — WARFARIN SODIUM 5 MG PO TABS
5.0000 mg | ORAL_TABLET | Freq: Once | ORAL | Status: AC
  Administered 2023-05-14: 5 mg via ORAL
  Filled 2023-05-14: qty 1

## 2023-05-14 MED ORDER — WARFARIN - PHARMACIST DOSING INPATIENT: Freq: Every day | Status: DC

## 2023-05-14 MED ORDER — POLYETHYLENE GLYCOL 3350 17 G PO PACK
17.0000 g | PACK | Freq: Every day | ORAL | Status: DC
  Administered 2023-05-14: 17 g via ORAL
  Filled 2023-05-14: qty 1

## 2023-05-14 MED ORDER — SODIUM CHLORIDE 0.9% IV SOLUTION: Freq: Once | INTRAVENOUS | Status: AC

## 2023-05-14 NOTE — Progress Notes (Signed)
Patient report called to 6N and given to [facility], receiving nurse.

## 2023-05-14 NOTE — Progress Notes (Addendum)
PROGRESS NOTE    IKENNA SAMRA  UUV:[id number] DOB: 1946/08/09 DOA: 05/13/2023 PCP: Assunta Found, MD    Brief Narrative:  BASIT TEAKELL is a 77 y.o. male with medical history significant for TAVR, NSTEMI, atrial fibrillation, CAD, cardiomyopathy.  Patient presented to the ED with complaints of bleeding per rectum.  Reports 2 episodes today.  Initially with dark blood, later with bright red blood.  Bleeding was painless.  He has hemorrhoids.  Denies prior episodes of bleeding.  He has never had a colonoscopy, stool cards which he sends in to his provider instead.   Assessment and Plan: * Rectal bleeding -episodes of painless rectal bleeding, hemoglobin stable -Has hemorrhoids.   -On anticoagulation with warfarin for atrial fibrillation, and Plavix.  INR in therapeutic range at 2.7.  Last Coumadin dose was last night. -patient and family requested transfer to [facility] overnight -Hold anticoagulation with Coumadin and Plavix -given vit K and FFP -LAB STILL NOT DRAWN THIS AM  Atrial fibrillation (HCC) Rate Controlled.  History of TAVR, but he is on anticoagulation for atrial fibrillation.  He was initially on Eliquis, but due to cost issues, he was converted to Coumadin. -Resume metoprolol -Hold Coumadin  CAD History of coronary artery disease with LAD PCI/DES x 3 07/2022.  Also with history of ischemic cardiomyopathy, hx of low EF initially 25%, improved to 60 to 65%-  02/2023. -Resume Plavix - Heart rate now down to 48, hold metoprolol -Hold Lasix for now  Mild hyponatremia   DVT prophylaxis: SCDs Start: 05/13/23 1734    Code Status: Full Code   Disposition Plan:  Level of care: Telemetry Medical Status is: Observation The patient will require care spanning > 2 midnights and should be moved to inpatient     Consultants:  GI   Subjective: Overall feeling better  Objective: Vitals:   05/14/23 0329 05/14/23 0354 05/14/23 0530 05/14/23 0810  BP: (!) 141/76 (!) 142/62  (!) 159/80 (!) 156/76  Pulse:  (!) 49 (!) 58 (!) 51  Resp:  19 19 17   Temp: 97.8 F (36.6 C) 97.9 F (36.6 C) 97.7 F (36.5 C) 97.9 F (36.6 C)  TempSrc: Oral Oral Oral Oral  SpO2: 100%  100% 100%  Weight:      Height:        Intake/Output Summary (Last 24 hours) at 05/14/2023 0932 Last data filed at 05/14/2023 0810 Gross per 24 hour  Intake 764.87 ml  Output 400 ml  Net 364.87 ml   Filed Weights   05/13/23 1422 05/13/23 1733 05/14/23 0147  Weight: 65.8 kg 64.8 kg 64.5 kg    Examination:   General: Appearance:    Well developed, well nourished male in no acute distress     Lungs:     respirations unlabored  Heart:    Bradycardic.    MS:   All extremities are intact.    Neurologic:   Awake, alert       Data Reviewed: I have personally reviewed following labs and imaging studies  CBC: Recent Labs  Lab 05/13/23 1525 05/13/23 1948 05/13/23 2303  WBC 5.2  --  6.0  NEUTROABS 2.9  --   --   HGB 13.7 13.5 13.7  HCT 39.5 39.8 40.0  MCV 96.1  --  97.3  PLT 193  --  184   Basic Metabolic Panel: Recent Labs  Lab 05/13/23 1525  NA 134*  K 4.0  CL 101  CO2 24  GLUCOSE 98  BUN 16  CREATININE 0.75  CALCIUM 8.5*   GFR: Estimated Creatinine Clearance: 68.3 mL/min (by C-G formula based on SCr of 0.75 mg/dL). Liver Function Tests: Recent Labs  Lab 05/13/23 1525  AST 22  ALT 18  ALKPHOS 87  BILITOT 1.4*  PROT 6.6  ALBUMIN 3.4*   No results for input(s): "LIPASE", "AMYLASE" in the last 168 hours. No results for input(s): "AMMONIA" in the last 168 hours. Coagulation Profile: Recent Labs  Lab 05/13/23 1525  INR 2.7*   Cardiac Enzymes: No results for input(s): "CKTOTAL", "CKMB", "CKMBINDEX", "TROPONINI" in the last 168 hours. BNP (last 3 results) No results for input(s): "PROBNP" in the last 8760 hours. HbA1C: No results for input(s): "HGBA1C" in the last 72 hours. CBG: No results for input(s): "GLUCAP" in the last 168 hours. Lipid Profile: No  results for input(s): "CHOL", "HDL", "LDLCALC", "TRIG", "CHOLHDL", "LDLDIRECT" in the last 72 hours. Thyroid Function Tests: No results for input(s): "TSH", "T4TOTAL", "FREET4", "T3FREE", "THYROIDAB" in the last 72 hours. Anemia Panel: Recent Labs    05/13/23 1525  VITAMINB12 349  FOLATE 13.2  FERRITIN 709*  TIBC 229*  IRON 149  RETICCTPCT 0.9   Sepsis Labs: No results for input(s): "PROCALCITON", "LATICACIDVEN" in the last 168 hours.  No results found for this or any previous visit (from the past 240 hour(s)).       Radiology Studies: No results found.      Scheduled Meds:  pantoprazole (PROTONIX) IV  40 mg Intravenous Q24H   Continuous Infusions:   LOS: 0 days    Time spent: 45 minutes spent on chart review, discussion with nursing staff, consultants, updating family and interview/physical exam; more than 50% of that time was spent in counseling and/or coordination of care.    Joseph Art, DO [facility] Available via Epic secure chat 7am-7pm After these hours, please refer to coverage provider listed on [email or web address] 05/14/2023, 9:32 AM

## 2023-05-14 NOTE — Progress Notes (Signed)
Patient son at bedside. Son is unable to calm patient down, patient expressing concerns of worse bleeding and wants to be where he had his heart procedures. Patient continues to insist on being transferred to [facility]. Dr. Mariea Clonts notified through secure chat. Transfer orders placed.

## 2023-05-14 NOTE — Progress Notes (Signed)
ANTICOAGULATION CONSULT NOTE - Initial Consult  Pharmacy Consult for warfarin Indication: atrial fibrillation  Allergies  Allergen Reactions   Doxycycline Other (See Comments)    Joint pain   Statins     Joint pain    Patient Measurements: Height: 5\' 10"  (177.8 cm) Weight: 64.5 kg (142 lb 3.2 oz) IBW/kg (Calculated) : 73   Vital Signs: Temp: 97.9 F (36.6 C) (08/22 0810) Temp Source: Oral (08/22 0810) BP: 156/76 (08/22 0810) Pulse Rate: 51 (08/22 0810)  Labs: Recent Labs    05/13/23 1525 05/13/23 1948 05/13/23 2303 05/14/23 1146  HGB 13.7 13.5 13.7 13.5  HCT 39.5 39.8 40.0 39.1  PLT 193  --  184 175  LABPROT 29.1*  --   --   --   INR 2.7*  --   --   --   CREATININE 0.75  --   --   --     Estimated Creatinine Clearance: 68.3 mL/min (by C-G formula based on SCr of 0.75 mg/dL).   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 Excell Seltzer & Weldner   Severe aortic stenosis      Assessment: 77 yo M on warfarin PTA for Afib admitted for painless rectal bleeding in the setting of compacted hard stool. Note patient has a hx of internal and external hemorrhoids. Patient was on apixaban for some time but converted to warfarin due to cost. Per GI, bleeding is from his hemorrhoids. Last dose of warfarin 05/12/23. INR on admit 2.7. Pharmacy consulted for warfarin dosing.   Last anticoagulation visit 04/13/23. PTA warfarin dose 2.5mg  on Thurs/Sun, 5mg  all other  days (confirmed with patient). CBC stable. No INR today but received 2.5mg  po vitamin K 8/21, which will take ~48-72hrs for full effect to show.  Goal of Therapy:  INR 2-3 Monitor platelets by anticoagulation protocol: Yes   Plan:  Warfarin 5mg  x1 Monitor daily INR, signs/symptoms of bleeding    Alphia Moh, PharmD, BCPS, BCCP Clinical Pharmacist  Please check AMION for all [facility] phone numbers After 10:00 PM, call Main Pharmacy [phone]

## 2023-05-14 NOTE — Plan of Care (Signed)
  Problem: Nutrition: Goal: Adequate nutrition will be maintained Outcome: Progressing   Problem: Pain Managment: Goal: General experience of comfort will improve Outcome: Not Applicable   Problem: Skin Integrity: Goal: Risk for impaired skin integrity will decrease Outcome: Progressing

## 2023-05-14 NOTE — Progress Notes (Signed)
Patient and son insist on transfer to [facility], I have  explained it's unlikely any procedures will be done tonight.  - One unit FFP ordered with persistent rectal bleeding. - Hgb stable at 13 x 3.  - [facility] bed request placed.  [facility] consulted via Secure chat, awaiting response. Unable to reach [facility] via Amion. May need to re-consult GI in a.m.   Inocente Salles, MD.  [facility]

## 2023-05-14 NOTE — Consult Note (Addendum)
Consultation  Referring Provider: Dr. Zenaida Niece    Primary Care Physician:  Assunta Found, MD Primary Gastroenterologist: Gentry Fitz        Reason for Consultation: Rectal bleeding            HPI:   Jonathan Romero is a 77 y.o. male with past medical history as listed below including CAD, A-fib on Plavix and Coumadin, GERD and status post TAVR who presented to the ER on 05/13/2023 with bleeding from the rectum.    At time of presentation patient reported 2 episodes of initially dark blood then bright red blood that was painless from the rectum.  Described a history of hemorrhoids.  Denied a previous colonoscopy.    Today, the patient tells me that he had to strain for a bowel movement on Monday, 05/11/2023, even having a stool get partially stuck which he had to manually remove, then tried to manually disimpact himself, but rather "pushed the hard stool back up inside himself", later that day he saw a little bit of bright red blood on the toilet paper and the next day this increased in amount and continued to the point where he presented to the ER.  Overnight he has actually not had any further bleeding, this morning he had a bowel movement with only a small amount of bright red blood residue on the toilet paper.  He denies any rectal pain.  At home he has been using Preparation H suppositories as he has in the past for these hemorrhoids.  Denies any abdominal pain or weight loss or change in bowel habits.    Denies fever, chills, weight loss, nausea, vomiting or abdominal pain.  ED course: Heart rate 50-54, BP 150s-180s, hemoglobin 13.7, INR 2.7, rectal exam in the ED with a large amount of blood in the rectal vault as well as internal and external engorged nontender hemorrhoids  Past Medical History:  Diagnosis Date   Arthritis    Cellulitis    Coronary artery disease    s/p 3 overlapping DES from proximal-mid LAD 08/13/22   Dyslipidemia    GERD (gastroesophageal reflux disease)     Hyperlipidemia 09/23/2011   LDL particle number [id number] with LDL CALULATED at 151 and 2300 in 1988 was the small LDL particle number,which is very high- not tinterested in taking medications   Myocardial infarction (HCC)    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 Excell Seltzer & Weldner   Severe aortic stenosis     Past Surgical History:  Procedure Laterality Date   CARDIOVERSION N/A 10/09/2022   Procedure: CARDIOVERSION;  Surgeon: Pricilla Riffle, MD;  Location: [facility] ENDOSCOPY;  Service: Cardiovascular;  Laterality: N/A;   CATARACT EXTRACTION     CORONARY ATHERECTOMY N/A 08/13/2022   Procedure: CORONARY ATHERECTOMY;  Surgeon: Tonny Bollman, MD;  Location: [facility] INVASIVE CV LAB;  Service: Cardiovascular;  Laterality: N/A;   CORONARY STENT INTERVENTION N/A 08/13/2022   Procedure: CORONARY STENT INTERVENTION;  Surgeon: Tonny Bollman, MD;  Location: [facility] INVASIVE CV LAB;  Service: Cardiovascular;  Laterality: N/A;   DOPPLER ECHOCARDIOGRAPHY  12/21/2012   EF 55-60%,   DOPPLER ECHOCARDIOGRAPHY  12/16/2011   MILD AORTIC STENOSIS,PEAK AND MEAN GRADIENTS OF 18 AND 8 mmHg and a valve area of around 2 square cm.    INTRAOPERATIVE TRANSTHORACIC ECHOCARDIOGRAM N/A 08/19/2022   Procedure: INTRAOPERATIVE TRANSTHORACIC ECHOCARDIOGRAM;  Surgeon: Tonny Bollman, MD;  Location: [facility]  OR;  Service: Open Heart Surgery;  Laterality: N/A;   RIGHT/LEFT HEART CATH AND CORONARY ANGIOGRAPHY N/A 08/11/2022   Procedure: RIGHT/LEFT HEART CATH AND CORONARY ANGIOGRAPHY;  Surgeon: Lyn Records, MD;  Location: [facility] INVASIVE CV LAB;  Service: Cardiovascular;  Laterality: N/A;   TEE WITHOUT CARDIOVERSION N/A 10/09/2022   Procedure: TRANSESOPHAGEAL ECHOCARDIOGRAM (TEE);  Surgeon: Pricilla Riffle, MD;  Location: [facility] ENDOSCOPY;  Service: Cardiovascular;  Laterality: N/A;   TRANSCATHETER AORTIC VALVE REPLACEMENT, TRANSFEMORAL N/A 08/19/2022   Procedure:  Transcatheter Aortic Valve Replacement, Transfemoral using a 26 MM Edwards SAPIEN 3 Ultra;  Surgeon: Tonny Bollman, MD;  Location: [facility] OR;  Service: Open Heart Surgery;  Laterality: N/A;  Transfemoral    Family History  Problem Relation Age of Onset   Diabetes Mother    Heart failure Father     Social History   Tobacco Use   Smoking status: Former    Current packs/day: 0.00    Types: Cigarettes    Quit date: 04/15/1983    Years since quitting: 40.1   Smokeless tobacco: Never   Tobacco comments:    Former smoker 10/16/22  Vaping Use   Vaping status: Never Used  Substance Use Topics   Alcohol use: Yes    Comment: beer occasionally 10/16/22   Drug use: No    Prior to Admission medications   Medication Sig Start Date End Date Taking? Authorizing Provider  clopidogrel (PLAVIX) 75 MG tablet Take 1 tablet (75 mg total) by mouth daily. 08/14/22  Yes Duke, Roe Rutherford, PA  ezetimibe (ZETIA) 10 MG tablet Take 1 tablet (10 mg total) by mouth daily. 08/14/22  Yes Duke, Roe Rutherford, PA  furosemide (LASIX) 20 MG tablet Take 1 tablet (20 mg total) by mouth daily. Patient taking differently: Take 20 mg by mouth daily as needed for fluid or edema (if bottom bp number is above 80). 10/06/22  Yes Janetta Hora, PA-C  hydrocortisone cream 1 % Apply 1 Application topically daily as needed (hemorrhoids).   Yes [provider]  metoprolol succinate (TOPROL-XL) 50 MG 24 hr tablet Take 1 tablet (50 mg total) by mouth daily. Take with or immediately following a meal. Patient taking differently: Take 100 mg by mouth daily. Take with or immediately following a meal. 10/10/22  Yes Janetta Hora, PA-C  nitroGLYCERIN (NITROSTAT) 0.4 MG SL tablet Place 1 tablet (0.4 mg total) under the tongue every 5 (five) minutes x 3 doses as needed for chest pain. 08/14/22  Yes Duke, Roe Rutherford, PA  warfarin (COUMADIN) 5 MG tablet TAKE 1 TABLET BY MOUTH ONCE DAILY OR AS DIRECTED BY ANTICOAGULATION  CLINIC. 04/02/23  Yes [place], Peter M, MD    Current Facility-Administered Medications  Medication Dose Route Frequency Provider Last Rate Last Admin   acetaminophen (TYLENOL) tablet 650 mg  650 mg Oral Q6H PRN Emokpae, Ejiroghene E, MD       Or   acetaminophen (TYLENOL) suppository 650 mg  650 mg Rectal Q6H PRN Emokpae, Ejiroghene E, MD       hydrALAZINE (APRESOLINE) injection 5 mg  5 mg Intravenous Q4H PRN Emokpae, Ejiroghene E, MD       ondansetron (ZOFRAN) tablet 4 mg  4 mg Oral Q6H PRN Emokpae, Ejiroghene E, MD       Or   ondansetron (ZOFRAN) injection 4 mg  4 mg Intravenous Q6H PRN Emokpae, Ejiroghene E, MD       pantoprazole (PROTONIX) injection 40 mg  40 mg Intravenous Q24H Emokpae,  Ejiroghene E, MD   40 mg at 05/13/23 1937   polyethylene glycol (MIRALAX / GLYCOLAX) packet 17 g  17 g Oral Daily PRN Emokpae, Ejiroghene E, MD        Allergies as of 05/13/2023 - Review Complete 05/13/2023  Allergen Reaction Noted   Doxycycline Other (See Comments) 10/09/2022   Statins  11/25/2018     Review of Systems:    Constitutional: No weight loss, fever or chills Skin: No rash Cardiovascular: No chest pain Respiratory: No SOB  Gastrointestinal: See HPI and otherwise negative Genitourinary: No dysuria Neurological: No headache, dizziness or syncope Musculoskeletal: No new muscle or joint pain Hematologic: No bruising Psychiatric: No history of depression or anxiety    Physical Exam:  Vital signs in last 24 hours: Temp:  [97.7 F (36.5 C)-98.5 F (36.9 C)] 97.9 F (36.6 C) (08/22 0810) Pulse Rate:  [48-58] 51 (08/22 0810) Resp:  [10-19] 17 (08/22 0810) BP: (141-182)/(62-92) 156/76 (08/22 0810) SpO2:  [98 %-100 %] 100 % (08/22 0810) Weight:  [64.5 kg-65.8 kg] 64.5 kg (08/22 0147) Last BM Date : 05/14/23 General:   Pleasant Elderly Caucasian male appears to be in NAD, Well developed, Well nourished, alert and cooperative Head:  Normocephalic and atraumatic. Eyes:   PEERL, EOMI.  No icterus. Conjunctiva pink. Ears:  Normal auditory acuity. Neck:  Supple Throat: Oral cavity and pharynx without inflammation, swelling or lesion. Teeth in good condition. Lungs: Respirations even and unlabored. Lungs clear to auscultation bilaterally.   No wheezes, crackles, or rhonchi.  Heart: Normal S1, S2. No MRG. Regular rate and rhythm. No peripheral edema, cyanosis or pallor.  Abdomen:  Soft, nondistended, nontender. No rebound or guarding. Normal bowel sounds. No appreciable masses or hepatomegaly. Rectal: External: Bloody residue throughout the gluteal cleft, 2 external hemorrhoids, noninflamed, visible grade 3 internal hemorrhoids with stigmata of recent bleeding slightly prolapsed; internal: No pain, no residue Msk:  Symmetrical without gross deformities. Peripheral pulses intact.  Extremities:  Without edema, no deformity or joint abnormality.  Neurologic:  Alert and  oriented x4;  grossly normal neurologically.  Skin:   Dry and intact without significant lesions or rashes. Psychiatric: Demonstrates good judgement and reason without abnormal affect or behaviors.   LAB RESULTS: Recent Labs    05/13/23 1525 05/13/23 1948 05/13/23 2303  WBC 5.2  --  6.0  HGB 13.7 13.5 13.7  HCT 39.5 39.8 40.0  PLT 193  --  184   BMET Recent Labs    05/13/23 1525  NA 134*  K 4.0  CL 101  CO2 24  GLUCOSE 98  BUN 16  CREATININE 0.75  CALCIUM 8.5*   LFT Recent Labs    05/13/23 1525  PROT 6.6  ALBUMIN 3.4*  AST 22  ALT 18  ALKPHOS 87  BILITOT 1.4*   PT/INR Recent Labs    05/13/23 1525  LABPROT 29.1*  INR 2.7*    Impression / Plan:   Impression: 1.  Rectal bleeding: Described 2 episodes of painless rectal bleeding, hemoglobin 13.7-stable overnight, only small amount of bloody residue on the toilet paper after wiping for normal brown stool (which I witnessed) in the toilet bowl this morning, on anticoagulation with Warfarin and Plavix, INR in therapeutic range at 2.7,  last Coumadin dose 05/13/2023 2.  A-fib: History of TAVR, on Coumadin and Plavix  Plan: 1.  At this point discussed that patient's rectal bleeding is from his hemorrhoids.  Started him on Hydrocortisone suppositories which I would recommend twice daily  for the next week or 2. 2.  Would also recommend a daily dose of MiraLAX to maintain soft normal bowel movements with minimal straining. 3.  Patient does not need emergent endoscopic procedures.  He can be discharged from a GI standpoint.  Would recommend that he follow-up with either [name] in outpatient clinic or a local GI physician in [place].  Thank you for your kind consultation, we will sign off.  Violet Baldy [facility]  05/14/2023, 9:10 AM  I have taken an interval history, thoroughly reviewed the chart and examined the patient. I agree with the Advanced Practitioner's note, impression and recommendations, and have recorded additional findings, impressions and recommendations below. I performed a substantive portion of this encounter (>50% time spent), including a complete performance of the medical decision making.  My additional thoughts are as follows:  Self-limited, benign internal hemorrhoidal bleeding precipitated by an episode of severe constipation and exacerbated by use of antiplatelet and anticoagulant medicine.  Hemoglobin remains normal. This is not consistent with diverticular bleeding nor an upper GI source.  No current plans for endoscopic evaluation.  Hemorrhoidal suppositories as recommended above and regular use of MiraLAX.  Stool softener or other agents can be added as needed to avoid constipation going forward. Hydrocortisone hemorrhoid suppositories twice daily for the next 7 to 10 days. Thereafter, standard over-the-counter Preparation H (phenylephrine) suppositories can be used if there is hemorrhoidal swelling or bleeding.  Outpatient GI follow-up as needed if recurrent symptoms, can contact our office or see the  [facility] in [place] closer to his home if he prefers.  Signing off, okay for home anytime from GI perspective.   Charlie Pitter III Office:[phone]

## 2023-05-14 NOTE — Care Management (Signed)
Transition of Care [facility]) - Inpatient Brief Assessment   Patient Details  Name: Jonathan Romero MRN: [id number] Date of Birth: 09-Nov-1945  [facility] CM/SW Contact:    Lockie Pares, RN Phone Number: 05/14/2023, 9:50 AM   Clinical Narrative: Presented for rectal bleeding from [place]. Independent, lives with spouse. No needs ID at this time. If a need is identified, please place a TOC consult   Transition of Care Asessment: Insurance and Status: Insurance coverage has been reviewed Patient has primary care physician: Yes Home environment has been reviewed: Y Prior level of function:: Independet Prior/Current Home Services: No current home services Social Determinants of Health Reivew: SDOH reviewed no interventions necessary Readmission risk has been reviewed: Yes Transition of care needs: no transition of care needs at this time

## 2023-05-14 NOTE — Progress Notes (Signed)
[facility] to floor to transfer patient. Patient son at bedside. Patient belongings sent with son and telemetry box removed.

## 2023-05-14 NOTE — Progress Notes (Signed)
Patient ambulated to bathroom and passing bright red blood in floor from rectum. Large amount of bright red blood in toilet, no bowel movement. Patient complaining of pressure in his rectum and difficulty starting stream with urination. Patient on phone with son and insisting to be transferred to [facility]. Dr. Lenoria Farrier notified and Dr. Mariea Clonts included in conversation by Dr. Lenoria Farrier. Patient stated his son is on way to hospital.

## 2023-05-15 ENCOUNTER — Other Ambulatory Visit (HOSPITAL_COMMUNITY): Payer: Self-pay

## 2023-05-15 DIAGNOSIS — K625 Hemorrhage of anus and rectum: Secondary | ICD-10-CM | POA: Diagnosis not present

## 2023-05-15 LAB — CBC
HCT: 40.5 % (ref 39.0–52.0)
Hemoglobin: 13.8 g/dL (ref 13.0–17.0)
MCH: 32.6 pg (ref 26.0–34.0)
MCHC: 34.1 g/dL (ref 30.0–36.0)
MCV: 95.7 fL (ref 80.0–100.0)
Platelets: 186 10*3/uL (ref 150–400)
RBC: 4.23 MIL/uL (ref 4.22–5.81)
RDW: 13 % (ref 11.5–15.5)
WBC: 8.6 10*3/uL (ref 4.0–10.5)
nRBC: 0 % (ref 0.0–0.2)

## 2023-05-15 LAB — BPAM FFP
Blood Product Expiration Date: 202408272359
ISSUE DATE / TIME: 202408220332
Unit Type and Rh: 5100

## 2023-05-15 LAB — PROTIME-INR
INR: 2 — ABNORMAL HIGH (ref 0.8–1.2)
Prothrombin Time: 22.9 s — ABNORMAL HIGH (ref 11.4–15.2)

## 2023-05-15 LAB — PREPARE FRESH FROZEN PLASMA

## 2023-05-15 MED ORDER — POLYETHYLENE GLYCOL 3350 17 GM/SCOOP PO POWD
17.0000 g | Freq: Every day | ORAL | 0 refills | Status: DC
  Filled 2023-05-15: qty 238, 14d supply, fill #0

## 2023-05-15 MED ORDER — METOPROLOL SUCCINATE ER 25 MG PO TB24
25.0000 mg | ORAL_TABLET | Freq: Every day | ORAL | 0 refills | Status: DC
  Filled 2023-05-15: qty 30, 30d supply, fill #0

## 2023-05-15 MED ORDER — METOPROLOL SUCCINATE ER 25 MG PO TB24: 12.5000 mg | ORAL_TABLET | Freq: Every day | ORAL | Status: DC

## 2023-05-15 MED ORDER — HYDROCORTISONE ACETATE 25 MG RE SUPP
25.0000 mg | Freq: Two times a day (BID) | RECTAL | 0 refills | Status: DC
  Filled 2023-05-15: qty 12, 6d supply, fill #0
  Filled 2023-05-15: qty 20, 10d supply, fill #0

## 2023-05-15 NOTE — Progress Notes (Signed)
Jonathan Romero to be D/C'd  per MD order.  Discussed with the patient and son Jonathan Romero all questions fully answered.  VSS, Skin clean, dry and intact without evidence of skin break down, no evidence of skin tears noted.  IV catheter discontinued intact. Site without signs and symptoms of complications. Dressing and pressure applied.  An After Visit Summary was printed and given to the patient. Prescription receive from [facility] pharmacy.  D/c education completed with patient/family including follow up instructions, medication list, d/c activities limitations if indicated, with other d/c instructions as indicated by MD - patient able to verbalize understanding, all questions fully answered.   Patient instructed to return to ED, call 911, or call MD for any changes in condition.   Patient to be escorted via WC, and D/C home via private auto.

## 2023-05-15 NOTE — Discharge Summary (Signed)
Physician Discharge Summary  Jonathan Romero VWU:[id number] DOB: Oct 24, 1945 DOA: 05/13/2023  PCP: Assunta Found, MD  Admit date: 05/13/2023 Discharge date: 05/15/2023  Admitted From: home Discharge disposition: home   Recommendations for Outpatient Follow-Up:   GI follow up for hemorrhoids    Discharge Diagnosis:   Principal Problem:   Rectal bleeding Active Problems:   NSTEMI (non-ST elevated myocardial infarction) [facility])   Atrial fibrillation (HCC)   S/P TAVR (transcatheter aortic valve replacement)   HTN (hypertension)    Discharge Condition: Improved.  Diet recommendation:  Regular.  Wound care: None.  Code status: Full.   History of Present Illness:   Jonathan Romero is a 77 y.o. male with medical history significant for TAVR, NSTEMI, atrial fibrillation, CAD, cardiomyopathy.  Patient presented to the ED with complaints of bleeding per rectum.  Reports 2 episodes today.  Initially with dark blood, later with bright red blood.  Bleeding was painless.  He has hemorrhoids.  No abdominal pain.  No vomiting of blood.  Denies prior episodes of bleeding.  He has never had a colonoscopy, stool cards which he sends in to his provider instead.   ED Course: Temperature 98, heart rate 50-54, respiratory 18-19, blood pressure systolic 150s to 295A.  Hemoglobin stable at 13.7.  INR 2.7. Rectal exam done in ED, with findings of large amount of blood in rectal vault, no stool with internal and external engorged non-tender hemorrhoids.  EDP Dr. Sandria Manly Dr. Garth Bigness, hold Coumadin, switch to heparin at hospitalist direction, will see in consult, if unstable may need to be reversed.     Hospital Course by Problem:   Rectal bleeding -episodes of painless rectal bleeding, hemoglobin stable -Has hemorrhoids.   -On anticoagulation with warfarin for atrial fibrillation, and Plavix.  INR in therapeutic range at 2.7.  Last Coumadin dose was last night. -patient and family requested  transfer to [facility] overnight -Hold anticoagulation with Coumadin and Plavix -given vit K and FFP -INR 2-- no further bleeding -GI consulted: Self-limited, benign internal hemorrhoidal bleeding precipitated by an episode of severe constipation and exacerbated by use of antiplatelet and anticoagulant medicine.  Hemoglobin remains normal. This is not consistent with diverticular bleeding nor an upper GI source.  No current plans for endoscopic evaluation. Hemorrhoidal suppositories as recommended above and regular use of MiraLAX.  Stool softener or other agents can be added as needed to avoid constipation going forward. Hydrocortisone hemorrhoid suppositories twice daily for the next 7 to 10 days. Thereafter, standard over-the-counter Preparation H (phenylephrine) suppositories can be used if there is hemorrhoidal swelling or bleeding. Outpatient GI follow-up as needed if recurrent symptoms, can contact our office or see the [facility] in [place] closer to his home if he prefers.   Atrial fibrillation (HCC) Rate Controlled.  History of TAVR, but he is on anticoagulation for atrial fibrillation.  He was initially on Eliquis, but due to cost issues, he was converted to Coumadin. -Resume metoprolol at lower dose -resume Coumadin   CAD History of coronary artery disease with LAD PCI/DES x 3 07/2022.  Also with history of ischemic cardiomyopathy, hx of low EF initially 25%, improved to 60 to 65%-  02/2023. -Resume Plavix - Heart rate now down to 48- resume BB at lower dose   Mild hyponatremia    Medical Consultants:   GI   Discharge Exam:   Vitals:   05/15/23 0326 05/15/23 0718  BP: 130/74 (!) 141/77  Pulse: 64 60  Resp:  16  Temp: 98.1 F (36.7 C) (!) 97.5 F (36.4 C)  SpO2: 98% 99%   Vitals:   05/14/23 1552 05/14/23 2115 05/15/23 0326 05/15/23 0718  BP: (!) 142/65 (!) 150/79 130/74 (!) 141/77  Pulse: (!) 50 (!) 55 64 60  Resp: 17   16  Temp: 97.7 F (36.5 C) 97.8 F (36.6  C) 98.1 F (36.7 C) (!) 97.5 F (36.4 C)  TempSrc: Oral Oral Oral Oral  SpO2: 100% 100% 98% 99%  Weight:      Height:        General exam: Appears calm and comfortable.    The results of significant diagnostics from this hospitalization (including imaging, microbiology, ancillary and laboratory) are listed below for reference.     Procedures and Diagnostic Studies:   No results found.   Labs:   Basic Metabolic Panel: Recent Labs  Lab 05/13/23 1525 05/14/23 1146  NA 134* 137  K 4.0 4.1  CL 101 105  CO2 24 25  GLUCOSE 98 89  BUN 16 10  CREATININE 0.75 0.91  CALCIUM 8.5* 8.6*   GFR Estimated Creatinine Clearance: 60.1 mL/min (by C-G formula based on SCr of 0.91 mg/dL). Liver Function Tests: Recent Labs  Lab 05/13/23 1525  AST 22  ALT 18  ALKPHOS 87  BILITOT 1.4*  PROT 6.6  ALBUMIN 3.4*   No results for input(s): "LIPASE", "AMYLASE" in the last 168 hours. No results for input(s): "AMMONIA" in the last 168 hours. Coagulation profile Recent Labs  Lab 05/13/23 1525 05/14/23 1146 05/15/23 0320  INR 2.7* 1.9* 2.0*    CBC: Recent Labs  Lab 05/13/23 1525 05/13/23 1948 05/13/23 2303 05/14/23 1146 05/15/23 0320  WBC 5.2  --  6.0 5.1 8.6  NEUTROABS 2.9  --   --   --   --   HGB 13.7 13.5 13.7 13.5 13.8  HCT 39.5 39.8 40.0 39.1 40.5  MCV 96.1  --  97.3 97.8 95.7  PLT 193  --  184 175 186   Cardiac Enzymes: No results for input(s): "CKTOTAL", "CKMB", "CKMBINDEX", "TROPONINI" in the last 168 hours. BNP: Invalid input(s): "POCBNP" CBG: No results for input(s): "GLUCAP" in the last 168 hours. D-Dimer No results for input(s): "DDIMER" in the last 72 hours. Hgb A1c No results for input(s): "HGBA1C" in the last 72 hours. Lipid Profile No results for input(s): "CHOL", "HDL", "LDLCALC", "TRIG", "CHOLHDL", "LDLDIRECT" in the last 72 hours. Thyroid function studies No results for input(s): "TSH", "T4TOTAL", "T3FREE", "THYROIDAB" in the last 72  hours.  Invalid input(s): "FREET3" Anemia work up Recent Labs    05/13/23 1525  VITAMINB12 349  FOLATE 13.2  FERRITIN 709*  TIBC 229*  IRON 149  RETICCTPCT 0.9   Microbiology No results found for this or any previous visit (from the past 240 hour(s)).   Discharge Instructions:   Discharge Instructions     Diet general   Complete by: As directed    Discharge instructions   Complete by: As directed    Hydrocortisone hemorrhoid suppositories twice daily for the next  10 days. Thereafter, standard over-the-counter Preparation H (phenylephrine) suppositories can be used if there is hemorrhoidal swelling or bleeding   Increase activity slowly   Complete by: As directed       Allergies as of 05/15/2023       Reactions   Doxycycline Other (See Comments)   Joint pain   Statins    Joint pain        Medication  List     TAKE these medications    clopidogrel 75 MG tablet Commonly known as: PLAVIX Take 1 tablet (75 mg total) by mouth daily.   ezetimibe 10 MG tablet Commonly known as: ZETIA Take 1 tablet (10 mg total) by mouth daily.   furosemide 20 MG tablet Commonly known as: LASIX Take 1 tablet (20 mg total) by mouth daily. What changed:  when to take this reasons to take this   hydrocortisone 25 MG suppository Commonly known as: ANUSOL-HC Place 1 suppository (25 mg total) rectally 2 (two) times daily.   hydrocortisone cream 1 % Apply 1 Application topically daily as needed (hemorrhoids).   metoprolol succinate 25 MG 24 hr tablet Commonly known as: TOPROL-XL Take 1 tablet (25 mg total) by mouth daily. What changed:  medication strength how much to take additional instructions   nitroGLYCERIN 0.4 MG SL tablet Commonly known as: NITROSTAT Place 1 tablet (0.4 mg total) under the tongue every 5 (five) minutes x 3 doses as needed for chest pain.   polyethylene glycol 17 g packet Commonly known as: MIRALAX / GLYCOLAX Take 17 g by mouth daily.    warfarin 5 MG tablet Commonly known as: COUMADIN Take as directed. If you are unsure how to take this medication, talk to your nurse or doctor. Original instructions: TAKE 1 TABLET BY MOUTH ONCE DAILY OR AS DIRECTED BY ANTICOAGULATION CLINIC.          Time coordinating discharge: 45  Signed:  Joseph Art DO  [facility] 05/15/2023, 8:21 AM

## 2023-05-19 ENCOUNTER — Other Ambulatory Visit: Payer: Self-pay | Admitting: Physician Assistant

## 2023-05-19 DIAGNOSIS — Z952 Presence of prosthetic heart valve: Secondary | ICD-10-CM

## 2023-05-26 ENCOUNTER — Ambulatory Visit: Payer: Medicare Other | Attending: Physician Assistant | Admitting: *Deleted

## 2023-05-26 DIAGNOSIS — I4891 Unspecified atrial fibrillation: Secondary | ICD-10-CM | POA: Insufficient documentation

## 2023-05-26 DIAGNOSIS — Z952 Presence of prosthetic heart valve: Secondary | ICD-10-CM | POA: Insufficient documentation

## 2023-05-26 DIAGNOSIS — Z5181 Encounter for therapeutic drug level monitoring: Secondary | ICD-10-CM | POA: Insufficient documentation

## 2023-05-26 LAB — POCT INR: INR: 2.8 (ref 2.0–3.0)

## 2023-05-26 NOTE — Patient Instructions (Signed)
Continue warfarin 1 tablet daily except 1/2 tablet on Sundays and Thursdays  Continue greens Recheck in 6 weeks

## 2023-06-17 ENCOUNTER — Other Ambulatory Visit (HOSPITAL_COMMUNITY): Payer: Self-pay

## 2023-06-26 ENCOUNTER — Ambulatory Visit
Admission: EM | Admit: 2023-06-26 | Discharge: 2023-06-26 | Disposition: A | Discharge status: Home / Self Care | Payer: Medicare Other | Attending: Family Medicine | Admitting: Family Medicine

## 2023-06-26 ENCOUNTER — Encounter: Payer: Self-pay | Admitting: Emergency Medicine

## 2023-06-26 DIAGNOSIS — H60392 Other infective otitis externa, left ear: Secondary | ICD-10-CM

## 2023-06-26 MED ORDER — CEPHALEXIN 500 MG PO CAPS: 500.0000 mg | ORAL_CAPSULE | Freq: Two times a day (BID) | ORAL | 0 refills | Status: DC

## 2023-06-26 MED ORDER — CIPROFLOXACIN-DEXAMETHASONE 0.3-0.1 % OT SUSP: 4.0000 [drp] | Freq: Two times a day (BID) | OTIC | 0 refills | Status: DC

## 2023-06-26 NOTE — ED Provider Notes (Signed)
[facility]    CSN: [id number] Arrival date & time: 06/26/23  1322      History   Chief Complaint No chief complaint on file.   HPI Jonathan Romero is a 77 y.o. male.   Patient presenting today with 1 week history of progressively worsening left ear pain, swelling, drainage.  He states it was initially itchy and other symptoms have followed and progressively worsened over the course of time.  He denies loss of hearing, headache, fever.  Not tried anything over-the-counter for symptoms.  He is concerned about possibly ear mites.    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) 10/16/2022   Encounter for therapeutic drug monitoring 10/02/2022   S/P TAVR (transcatheter aortic valve replacement) 08/19/2022   Atrial fibrillation (HCC) 08/14/2022   Ischemic cardiomyopathy 08/14/2022   Palpitations 08/11/2022   Syncope 08/11/2022   NSTEMI (non-ST elevated myocardial infarction) (HCC) 08/09/2022   Left facial numbness 11/25/2018   Hyperlipidemia 04/14/2013   Aortic stenosis, severe 04/14/2013    Past Surgical History:  Procedure Laterality Date   CARDIOVERSION N/A 10/09/2022   Procedure: CARDIOVERSION;  Surgeon: Pricilla Riffle, MD;   Location: [facility] ENDOSCOPY;  Service: Cardiovascular;  Laterality: N/A;   CATARACT EXTRACTION     CORONARY ATHERECTOMY N/A 08/13/2022   Procedure: CORONARY ATHERECTOMY;  Surgeon: Tonny Bollman, MD;  Location: [facility] INVASIVE CV LAB;  Service: Cardiovascular;  Laterality: N/A;   CORONARY STENT INTERVENTION N/A 08/13/2022   Procedure: CORONARY STENT INTERVENTION;  Surgeon: Tonny Bollman, MD;  Location: [facility] INVASIVE CV LAB;  Service: Cardiovascular;  Laterality: N/A;   DOPPLER ECHOCARDIOGRAPHY  12/21/2012   EF 55-60%,   DOPPLER ECHOCARDIOGRAPHY  12/16/2011   MILD AORTIC STENOSIS,PEAK AND MEAN GRADIENTS OF 18 AND 8 mmHg and a valve area of around 2 square cm.    INTRAOPERATIVE TRANSTHORACIC ECHOCARDIOGRAM N/A 08/19/2022   Procedure: INTRAOPERATIVE TRANSTHORACIC ECHOCARDIOGRAM;  Surgeon: Tonny Bollman, MD;  Location: [facility] OR;  Service: Open Heart Surgery;  Laterality: N/A;   RIGHT/LEFT HEART CATH AND CORONARY ANGIOGRAPHY N/A 08/11/2022   Procedure: RIGHT/LEFT HEART CATH AND CORONARY ANGIOGRAPHY;  Surgeon: Lyn Records, MD;  Location: [facility] INVASIVE CV LAB;  Service: Cardiovascular;  Laterality: N/A;   TEE WITHOUT CARDIOVERSION N/A 10/09/2022   Procedure: TRANSESOPHAGEAL ECHOCARDIOGRAM (TEE);  Surgeon: Pricilla Riffle, MD;  Location: [facility] ENDOSCOPY;  Service: Cardiovascular;  Laterality: N/A;   TRANSCATHETER AORTIC VALVE REPLACEMENT, TRANSFEMORAL N/A 08/19/2022   Procedure: Transcatheter Aortic Valve Replacement, Transfemoral using a 26 MM Edwards SAPIEN 3 Ultra;  Surgeon: Tonny Bollman, MD;  Location: [facility] OR;  Service: Open Heart Surgery;  Laterality: N/A;  Transfemoral       Home Medications  Prior to Admission medications   Medication Sig Start Date End Date Taking? Authorizing Provider  cephALEXin (KEFLEX) 500 MG capsule Take 1 capsule (500 mg total) by mouth 2 (two) times daily. 06/26/23  Yes Particia Nearing, PA-C  ciprofloxacin-dexamethasone [facility]) OTIC suspension Place 4 drops into the  left ear 2 (two) times daily. 06/26/23  Yes Particia Nearing, PA-C  clopidogrel (PLAVIX) 75 MG tablet Take 1 tablet (75 mg total) by mouth daily. 08/14/22   Duke, Roe Rutherford, PA  ezetimibe (ZETIA) 10 MG tablet Take 1 tablet (10 mg total) by mouth daily. 08/14/22   Duke, Roe Rutherford, PA  furosemide (LASIX) 20 MG tablet Take 1 tablet (20 mg total) by mouth daily. Patient taking differently: Take 20 mg by mouth daily as needed for fluid or edema (if bottom bp number is above 80). 10/06/22   Janetta Hora, PA-C  hydrocortisone (ANUSOL-HC) 25 MG suppository Unwrap and place 1 suppository (25 mg total) rectally 2 (two) times daily. 05/15/23   Joseph Art, DO  hydrocortisone cream 1 % Apply 1 Application topically daily as needed (hemorrhoids).    [provider]  metoprolol succinate (TOPROL-XL) 25 MG 24 hr tablet Take 1 tablet (25 mg total) by mouth daily. 05/15/23   Joseph Art, DO  nitroGLYCERIN (NITROSTAT) 0.4 MG SL tablet Place 1 tablet (0.4 mg total) under the tongue every 5 (five) minutes x 3 doses as needed for chest pain. 08/14/22   Duke, Roe Rutherford, PA  polyethylene glycol powder (GLYCOLAX/MIRALAX) 17 GM/SCOOP powder Take 17 g by mouth daily. 05/15/23   Joseph Art, DO  warfarin (COUMADIN) 5 MG tablet TAKE 1 TABLET BY MOUTH ONCE DAILY OR AS DIRECTED BY ANTICOAGULATION CLINIC. 04/02/23   [place], Peter M, MD    Family History Family History  Problem Relation Age of Onset   Diabetes Mother    Heart failure Father     Social History Social History   Tobacco Use   Smoking status: Former    Current packs/day: 0.00    Types: Cigarettes    Quit date: 04/15/1983    Years since quitting: 40.2   Smokeless tobacco: Never   Tobacco comments:    Former smoker 10/16/22  Vaping Use   Vaping status: Never Used  Substance Use Topics   Alcohol use: Yes    Comment: beer occasionally 10/16/22   Drug use: No     Allergies   Doxycycline and Statins   Review  of Systems Review of Systems Per HPI  Physical Exam Triage Vital Signs ED Triage Vitals  Encounter Vitals Group     BP 06/26/23 1516 (!) 179/87     Systolic BP Percentile --      Diastolic BP Percentile --      Pulse Rate 06/26/23 1516 (!) 57     Resp 06/26/23 1516 18     Temp 06/26/23 1516 97.8 F (36.6 C)     Temp Source 06/26/23 1516 Oral     SpO2 06/26/23 1516 96 %     Weight --      Height --      Head Circumference --      Peak Flow --      Pain Score 06/26/23 1518 6     Pain Loc --      Pain Education --      Exclude from Growth Chart --    No data found.  Updated Vital Signs BP (!) 179/87 (BP Location:  Right Arm)   Pulse (!) 57   Temp 97.8 F (36.6 C) (Oral)   Resp 18   SpO2 96%   Visual Acuity Right Eye Distance:   Left Eye Distance:   Bilateral Distance:    Right Eye Near:   Left Eye Near:    Bilateral Near:     Physical Exam Vitals and nursing note reviewed.  Constitutional:      Appearance: Normal appearance.  HENT:     Head: Atraumatic.     Right Ear: Tympanic membrane and external ear normal.     Ears:     Comments: Left external ear and ear canal significantly erythematous, edematous, dried drainage present to canal and auricle    Nose: Nose normal.     Mouth/Throat:     Mouth: Mucous membranes are moist.  Eyes:     Extraocular Movements: Extraocular movements intact.     Conjunctiva/sclera: Conjunctivae normal.  Cardiovascular:     Rate and Rhythm: Normal rate.  Pulmonary:     Effort: Pulmonary effort is normal.  Musculoskeletal:        General: Normal range of motion.     Cervical back: Normal range of motion and neck supple.  Skin:    General: Skin is warm and dry.  Neurological:     General: No focal deficit present.     Mental Status: He is oriented to person, place, and time.  Psychiatric:        Mood and Affect: Mood normal.        Thought Content: Thought content normal.        Judgment: Judgment normal.      UC  Treatments / Results  Labs (all labs ordered are listed, but only abnormal results are displayed) Labs Reviewed - No data to display  EKG   Radiology No results found.  Procedures Procedures (including critical care time)  Medications Ordered in UC Medications - No data to display  Initial Impression / Assessment and Plan / UC Course  I have reviewed the triage vital signs and the nursing notes.  Pertinent labs & imaging results that were available during my care of the patient were reviewed by me and considered in my medical decision making (see chart for details).     Given the extent of the infection, will treat with Keflex, Ciprodex drops, Tylenol and supportive measures.  Patient is on Coumadin, discussed to follow-up closely with PCP to see if they want to check his INR again given antibiotic use.  Knows to follow-up for any bleeding or bruising issues.  Final Clinical Impressions(s) / UC Diagnoses   Final diagnoses:  Infective otitis externa of left ear     Discharge Instructions      Follow-up with your primary care provider on Monday to see if they want to recheck your INR right away since you are on antibiotics.  Take the full course of antibiotics and use the eardrops until your symptoms resolve.  Return for worsening or unresolving symptoms.    ED Prescriptions     Medication Sig Dispense Auth. Provider   ciprofloxacin-dexamethasone (CIPRODEX) OTIC suspension Place 4 drops into the left ear 2 (two) times daily. 7.5 mL Particia Nearing, PA-C   cephALEXin (KEFLEX) 500 MG capsule Take 1 capsule (500 mg total) by mouth 2 (two) times daily. 14 capsule Particia Nearing, [place]      PDMP not reviewed this encounter.   Particia Nearing, [place] 06/26/23 1623

## 2023-06-26 NOTE — Discharge Instructions (Signed)
Follow-up with your primary care provider on Monday to see if they want to recheck your INR right away since you are on antibiotics.  Take the full course of antibiotics and use the eardrops until your symptoms resolve.  Return for worsening or unresolving symptoms.

## 2023-06-26 NOTE — ED Triage Notes (Signed)
States he thinks he has ear mites in left ear x 1 week.  States ear itches and burns and is draining.  States he had been working out side and got something in his head.

## 2023-07-02 ENCOUNTER — Observation Stay (HOSPITAL_COMMUNITY)
Admission: EM | Admit: 2023-07-02 | Discharge: 2023-07-04 | Disposition: A | Discharge status: Home / Self Care | Payer: Medicare Other | Attending: Internal Medicine | Admitting: Internal Medicine

## 2023-07-02 ENCOUNTER — Other Ambulatory Visit: Payer: Self-pay

## 2023-07-02 ENCOUNTER — Emergency Department (HOSPITAL_COMMUNITY): Payer: Medicare Other

## 2023-07-02 ENCOUNTER — Encounter (HOSPITAL_COMMUNITY): Payer: Self-pay

## 2023-07-02 DIAGNOSIS — I5020 Unspecified systolic (congestive) heart failure: Secondary | ICD-10-CM | POA: Diagnosis not present

## 2023-07-02 DIAGNOSIS — R29818 Other symptoms and signs involving the nervous system: Secondary | ICD-10-CM | POA: Diagnosis not present

## 2023-07-02 DIAGNOSIS — I639 Cerebral infarction, unspecified: Principal | ICD-10-CM | POA: Diagnosis present

## 2023-07-02 DIAGNOSIS — R791 Abnormal coagulation profile: Secondary | ICD-10-CM | POA: Diagnosis not present

## 2023-07-02 DIAGNOSIS — E785 Hyperlipidemia, unspecified: Secondary | ICD-10-CM | POA: Diagnosis present

## 2023-07-02 DIAGNOSIS — S80811A Abrasion, right lower leg, initial encounter: Secondary | ICD-10-CM | POA: Insufficient documentation

## 2023-07-02 DIAGNOSIS — I110 Hypertensive heart disease with heart failure: Secondary | ICD-10-CM | POA: Insufficient documentation

## 2023-07-02 DIAGNOSIS — X58XXXA Exposure to other specified factors, initial encounter: Secondary | ICD-10-CM | POA: Diagnosis not present

## 2023-07-02 DIAGNOSIS — Z955 Presence of coronary angioplasty implant and graft: Secondary | ICD-10-CM | POA: Insufficient documentation

## 2023-07-02 DIAGNOSIS — I4891 Unspecified atrial fibrillation: Secondary | ICD-10-CM | POA: Diagnosis present

## 2023-07-02 DIAGNOSIS — I4819 Other persistent atrial fibrillation: Secondary | ICD-10-CM | POA: Diagnosis present

## 2023-07-02 DIAGNOSIS — I2510 Atherosclerotic heart disease of native coronary artery without angina pectoris: Secondary | ICD-10-CM | POA: Insufficient documentation

## 2023-07-02 DIAGNOSIS — Z7902 Long term (current) use of antithrombotics/antiplatelets: Secondary | ICD-10-CM | POA: Insufficient documentation

## 2023-07-02 DIAGNOSIS — R6884 Jaw pain: Secondary | ICD-10-CM | POA: Diagnosis not present

## 2023-07-02 DIAGNOSIS — R200 Anesthesia of skin: Secondary | ICD-10-CM | POA: Diagnosis present

## 2023-07-02 DIAGNOSIS — I10 Essential (primary) hypertension: Principal | ICD-10-CM

## 2023-07-02 DIAGNOSIS — R9089 Other abnormal findings on diagnostic imaging of central nervous system: Secondary | ICD-10-CM | POA: Diagnosis not present

## 2023-07-02 DIAGNOSIS — Z79899 Other long term (current) drug therapy: Secondary | ICD-10-CM | POA: Diagnosis not present

## 2023-07-02 DIAGNOSIS — Z87891 Personal history of nicotine dependence: Secondary | ICD-10-CM | POA: Diagnosis not present

## 2023-07-02 DIAGNOSIS — Z7901 Long term (current) use of anticoagulants: Secondary | ICD-10-CM | POA: Diagnosis not present

## 2023-07-02 DIAGNOSIS — Z952 Presence of prosthetic heart valve: Secondary | ICD-10-CM

## 2023-07-02 DIAGNOSIS — I6782 Cerebral ischemia: Secondary | ICD-10-CM | POA: Diagnosis not present

## 2023-07-02 DIAGNOSIS — I700 Atherosclerosis of aorta: Secondary | ICD-10-CM | POA: Diagnosis not present

## 2023-07-02 LAB — CBC
HCT: 40.5 % (ref 39.0–52.0)
Hemoglobin: 13.8 g/dL (ref 13.0–17.0)
MCH: 33.1 pg (ref 26.0–34.0)
MCHC: 34.1 g/dL (ref 30.0–36.0)
MCV: 97.1 fL (ref 80.0–100.0)
Platelets: 210 10*3/uL (ref 150–400)
RBC: 4.17 MIL/uL — ABNORMAL LOW (ref 4.22–5.81)
RDW: 12.6 % (ref 11.5–15.5)
WBC: 6.9 10*3/uL (ref 4.0–10.5)
nRBC: 0 % (ref 0.0–0.2)

## 2023-07-02 LAB — PROTIME-INR
INR: 1.4 — ABNORMAL HIGH (ref 0.8–1.2)
Prothrombin Time: 17.8 s — ABNORMAL HIGH (ref 11.4–15.2)

## 2023-07-02 NOTE — ED Triage Notes (Signed)
Pt arrived from home via POV c/o left sided jaw pain x 2 days and felt clammy earlier. Pt denies chest pain. Pt has cardiac hx.

## 2023-07-03 ENCOUNTER — Emergency Department (HOSPITAL_COMMUNITY): Payer: Medicare Other

## 2023-07-03 ENCOUNTER — Observation Stay (HOSPITAL_COMMUNITY): Payer: Medicare Other

## 2023-07-03 ENCOUNTER — Other Ambulatory Visit (HOSPITAL_COMMUNITY): Payer: Self-pay

## 2023-07-03 DIAGNOSIS — I10 Essential (primary) hypertension: Secondary | ICD-10-CM | POA: Diagnosis not present

## 2023-07-03 DIAGNOSIS — R29818 Other symptoms and signs involving the nervous system: Secondary | ICD-10-CM | POA: Diagnosis not present

## 2023-07-03 DIAGNOSIS — I6782 Cerebral ischemia: Secondary | ICD-10-CM | POA: Diagnosis not present

## 2023-07-03 DIAGNOSIS — R9089 Other abnormal findings on diagnostic imaging of central nervous system: Secondary | ICD-10-CM | POA: Diagnosis not present

## 2023-07-03 DIAGNOSIS — I672 Cerebral atherosclerosis: Secondary | ICD-10-CM | POA: Diagnosis not present

## 2023-07-03 DIAGNOSIS — I6523 Occlusion and stenosis of bilateral carotid arteries: Secondary | ICD-10-CM | POA: Diagnosis not present

## 2023-07-03 DIAGNOSIS — Q112 Microphthalmos: Secondary | ICD-10-CM | POA: Diagnosis not present

## 2023-07-03 DIAGNOSIS — I2510 Atherosclerotic heart disease of native coronary artery without angina pectoris: Secondary | ICD-10-CM | POA: Insufficient documentation

## 2023-07-03 DIAGNOSIS — I639 Cerebral infarction, unspecified: Secondary | ICD-10-CM | POA: Diagnosis present

## 2023-07-03 LAB — BASIC METABOLIC PANEL
Anion gap: 12 (ref 5–15)
BUN: 11 mg/dL (ref 8–23)
CO2: 25 mmol/L (ref 22–32)
Calcium: 9.2 mg/dL (ref 8.9–10.3)
Chloride: 99 mmol/L (ref 98–111)
Creatinine, Ser: 0.77 mg/dL (ref 0.61–1.24)
GFR, Estimated: >60 mL/min (ref >60)
Glucose, Bld: 97 mg/dL (ref 70–99)
Potassium: 4.2 mmol/L (ref 3.5–5.1)
Sodium: 136 mmol/L (ref 135–145)

## 2023-07-03 LAB — LIPID PANEL
Cholesterol: 232 mg/dL — ABNORMAL HIGH (ref 0–200)
HDL: 44 mg/dL (ref >40)
LDL Cholesterol: 170 mg/dL — ABNORMAL HIGH (ref 0–99)
Total CHOL/HDL Ratio: 5.3 {ratio}
Triglycerides: 90 mg/dL (ref <150)
VLDL: 18 mg/dL (ref 0–40)

## 2023-07-03 LAB — HEMOGLOBIN A1C
Hgb A1c MFr Bld: 5.1 % (ref 4.8–5.6)
Mean Plasma Glucose: 99.67 mg/dL

## 2023-07-03 LAB — TROPONIN I (HIGH SENSITIVITY)
Troponin I (High Sensitivity): 10 ng/L (ref <18)
Troponin I (High Sensitivity): 10 ng/L (ref <18)

## 2023-07-03 MED ORDER — METOPROLOL SUCCINATE ER 25 MG PO TB24
25.0000 mg | ORAL_TABLET | Freq: Every day | ORAL | Status: DC
  Administered 2023-07-03 – 2023-07-04 (×2): 25 mg via ORAL
  Filled 2023-07-03 (×2): qty 1

## 2023-07-03 MED ORDER — EZETIMIBE 10 MG PO TABS
10.0000 mg | ORAL_TABLET | Freq: Every day | ORAL | Status: DC
  Administered 2023-07-03 – 2023-07-04 (×2): 10 mg via ORAL
  Filled 2023-07-03 (×2): qty 1

## 2023-07-03 MED ORDER — STROKE: EARLY STAGES OF RECOVERY BOOK
Freq: Once | Status: AC
  Filled 2023-07-03: qty 1

## 2023-07-03 MED ORDER — CLOPIDOGREL BISULFATE 75 MG PO TABS
75.0000 mg | ORAL_TABLET | Freq: Every day | ORAL | Status: DC
  Administered 2023-07-03 – 2023-07-04 (×2): 75 mg via ORAL
  Filled 2023-07-03 (×2): qty 1

## 2023-07-03 MED ORDER — CEPHALEXIN 250 MG PO CAPS
500.0000 mg | ORAL_CAPSULE | Freq: Two times a day (BID) | ORAL | Status: AC
  Administered 2023-07-03: 500 mg via ORAL
  Filled 2023-07-03: qty 2

## 2023-07-03 MED ORDER — WARFARIN SODIUM 5 MG PO TABS
5.0000 mg | ORAL_TABLET | ORAL | Status: DC
  Administered 2023-07-03: 5 mg via ORAL
  Filled 2023-07-03 (×2): qty 1

## 2023-07-03 MED ORDER — WARFARIN - PHARMACIST DOSING INPATIENT: Freq: Every day | Status: DC

## 2023-07-03 MED ORDER — ACETAMINOPHEN 325 MG PO TABS: 650.0000 mg | ORAL_TABLET | Freq: Four times a day (QID) | ORAL | Status: DC | PRN

## 2023-07-03 MED ORDER — ACETAMINOPHEN 650 MG RE SUPP: 650.0000 mg | Freq: Four times a day (QID) | RECTAL | Status: DC | PRN

## 2023-07-03 MED ORDER — POLYETHYLENE GLYCOL 3350 17 G PO PACK: 17.0000 g | PACK | Freq: Every day | ORAL | Status: DC | PRN

## 2023-07-03 MED ORDER — IOHEXOL 350 MG/ML SOLN
75.0000 mL | Freq: Once | INTRAVENOUS | Status: AC | PRN
  Administered 2023-07-03: 75 mL via INTRAVENOUS

## 2023-07-03 MED ORDER — WARFARIN SODIUM 2.5 MG PO TABS: 2.5000 mg | ORAL_TABLET | ORAL | Status: DC

## 2023-07-03 NOTE — Progress Notes (Addendum)
PHARMACY - ANTICOAGULATION CONSULT NOTE  Pharmacy Consult for warfarin Indication: atrial fibrillation  Allergies  Allergen Reactions   Doxycycline Other (See Comments)    Joint pain   Statins     Joint pain    Patient Measurements:  Height: 5\' 10"  (177.8 cm) Weight: 64.5 kg (142 lb 3.2 oz) IBW/kg (Calculated) : 73  Vital Signs: Temp: 97.5 F (36.4 C) (10/11 0807) Temp Source: Oral (10/11 0047) BP: 177/80 (10/11 0807) Pulse Rate: 50 (10/11 0807)  Labs: Recent Labs    07/02/23 2332 07/03/23 0234  HGB 13.8  --   HCT 40.5  --   PLT 210  --   LABPROT 17.8*  --   INR 1.4*  --   CREATININE 0.77  --   TROPONINIHS 10 10    CrCl cannot be calculated (Unknown ideal weight.).   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 Excell Seltzer & Weldner   Severe aortic stenosis     Medications:  Scheduled:   cephALEXin  500 mg Oral BID   clopidogrel  75 mg Oral Daily   ezetimibe  10 mg Oral Daily   metoprolol succinate  25 mg Oral Daily    Assessment: Patient presents with Acute R parietal lobe CVA. He has atrial fibrillation and is managed in an anti-coag clinic. His last visit to the clinic was 05/26/23. He is seeing them every 4 weeks and was due for appointment on 10/14. At last appointment his INR was 2.8 at that time. No changes to warfarin dose were made, but patient reports that he was told to eat "sauerkraut weekly". His INR was 1.4 on presentation to  ED. He also reports being on a "red pill for ear mites" recently. This is likely the 500 mg keflex prescription he received recently. He reports that he missed the dose of warfarin on 10/10. Taken together, these factors explain the subtherapeutic INR he presented with. His diet has been poor over the last 2 days. I suspect resumption of his home regimen will be enough to raise his INR back to therapeutic range over the next 2-3 days.   Goal of Therapy:  INR 2-3 Monitor platelets by anticoagulation protocol: Yes   Plan:  Resume home warfarin regimen - 2.5 mg on Sunday and Thursday, 5 mg all other days.  Daily INR, CBC.   Cedric Fishman 07/03/2023,10:12 AM

## 2023-07-03 NOTE — ED Provider Notes (Signed)
  Physical Exam  BP (!) 177/80   Pulse (!) 50   Temp (!) 97.5 F (36.4 C)   Resp 16   SpO2 99%   Physical Exam Constitutional:      General: He is not in acute distress.    Appearance: Normal appearance.  HENT:     Head: Normocephalic and atraumatic.     Nose: No congestion or rhinorrhea.  Eyes:     General:        Right eye: No discharge.        Left eye: No discharge.     Extraocular Movements: Extraocular movements intact.     Pupils: Pupils are equal, round, and reactive to light.  Cardiovascular:     Rate and Rhythm: Normal rate and regular rhythm.     Heart sounds: No murmur heard. Pulmonary:     Effort: No respiratory distress.     Breath sounds: No wheezing or rales.  Abdominal:     General: There is no distension.     Tenderness: There is no abdominal tenderness.  Musculoskeletal:        General: Normal range of motion.     Cervical back: Normal range of motion.  Skin:    General: Skin is warm and dry.  Neurological:     General: No focal deficit present.     Mental Status: He is alert.     Sensory: Sensory deficit present.     Procedures  Procedures  ED Course / MDM    Medical Decision Making Amount and/or Complexity of Data Reviewed Labs: ordered. Radiology: ordered.  Patient received in handoff.  Facial numbness.  New parietal stroke.  Spoke with Dr. Derry Lory who is recommending medical admission for completion of stroke workup.  Patient admitted.       Glendora Score, MD 07/03/23 [phone]

## 2023-07-03 NOTE — H&P (Addendum)
Date: 07/03/2023               Patient Name:  Jonathan Romero MRN: [id number]  DOB: Jun 15, 1946 Age / Sex: 77 y.o., male   PCP: Assunta Found, MD         Medical Service: Internal Medicine Teaching Service         Attending Physician: Dr. Reymundo Poll, MD    First Contact: Dr. Morrie Sheldon Pager: [phone]  Second Contact: Dr. Rana Snare Pager: [phone]       After Hours (After 5p/  First Contact Pager: [phone]  weekends / holidays): Second Contact Pager: [phone]   Chief Complaint: face tightness  History of Present Illness: Jonathan Romero is a 77 y.o. M with PMh atrial fibrillation s/p DCCV, severe aortic stenosis s/p TAVR, MI s/p DES x3, HLD, who presented with L facial numbness admitted for work-up in setting of acute R parietal lobe CVA.  He explains that yesterday evening around 6P he noticed his face started to feel tight so he checked his blood pressure and it was relatively normal (SBP 140s). Later in the evening his facial tightness persisted and he noticed his BP was now very high (SBP >200) so he came to be evaluated.   He has had no other neurological deficits or changes including no issues with vision, speech, strength/mobility, no falls. He follows with a warfarin clinic and is due to be seen again on 10/15.   Meds:  Cephalexin 500 mg BID (missed 2nd dose yesterday, which would have been day 7) Ciprofloxacin-dexamethasone 4 drops into L ear BID Clopidogrel 75 mg daily Ezetimibe 10 mg daily Furosemide 20 mg diaily PRN Hydrocortisone suppository BID Metoprolol 25 mg daily Nitroglycerin PRN Miralax 17g daily Warfarin 5 mg daily on MTWFSa, 2.5 mg daily on ThSu  Allergies: Allergies as of 07/02/2023 - Review Complete 07/02/2023  Allergen Reaction Noted   Doxycycline Other (See Comments) 10/09/2022   Statins  11/25/2018   Past Medical History: Past Medical History:  Diagnosis Date   Arthritis    Cellulitis    Coronary artery disease    s/p 3  overlapping DES from proximal-mid LAD 08/13/22   Dyslipidemia    GERD (gastroesophageal reflux disease)    Hyperlipidemia 09/23/2011   LDL particle number [id number] with LDL CALULATED at 151 and 2300 in 1988 was the small LDL particle number,which is very high- not tinterested in taking medications   Myocardial infarction (HCC)    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 Excell Seltzer & Weldner   Severe aortic stenosis    Past Surgical History: Past Surgical History:  Procedure Laterality Date   CARDIOVERSION N/A 10/09/2022   Procedure: CARDIOVERSION;  Surgeon: Pricilla Riffle, MD;  Location: [facility] ENDOSCOPY;  Service: Cardiovascular;  Laterality: N/A;   CATARACT EXTRACTION     CORONARY ATHERECTOMY N/A 08/13/2022   Procedure: CORONARY ATHERECTOMY;  Surgeon: Tonny Bollman, MD;  Location: [facility] INVASIVE CV LAB;  Service: Cardiovascular;  Laterality: N/A;   CORONARY STENT INTERVENTION N/A 08/13/2022   Procedure: CORONARY STENT INTERVENTION;  Surgeon: Tonny Bollman, MD;  Location: [facility] INVASIVE CV LAB;  Service: Cardiovascular;  Laterality: N/A;   DOPPLER ECHOCARDIOGRAPHY  12/21/2012   EF 55-60%,   DOPPLER ECHOCARDIOGRAPHY  12/16/2011   MILD AORTIC STENOSIS,PEAK AND MEAN GRADIENTS OF 18 AND 8 mmHg and a valve area of around 2 square cm.  INTRAOPERATIVE TRANSTHORACIC ECHOCARDIOGRAM N/A 08/19/2022   Procedure: INTRAOPERATIVE TRANSTHORACIC ECHOCARDIOGRAM;  Surgeon: Tonny Bollman, MD;  Location: [facility] OR;  Service: Open Heart Surgery;  Laterality: N/A;   RIGHT/LEFT HEART CATH AND CORONARY ANGIOGRAPHY N/A 08/11/2022   Procedure: RIGHT/LEFT HEART CATH AND CORONARY ANGIOGRAPHY;  Surgeon: Lyn Records, MD;  Location: [facility] INVASIVE CV LAB;  Service: Cardiovascular;  Laterality: N/A;   TEE WITHOUT CARDIOVERSION N/A 10/09/2022   Procedure: TRANSESOPHAGEAL ECHOCARDIOGRAM (TEE);  Surgeon: Pricilla Riffle, MD;  Location: [facility] ENDOSCOPY;   Service: Cardiovascular;  Laterality: N/A;   TRANSCATHETER AORTIC VALVE REPLACEMENT, TRANSFEMORAL N/A 08/19/2022   Procedure: Transcatheter Aortic Valve Replacement, Transfemoral using a 26 MM Edwards SAPIEN 3 Ultra;  Surgeon: Tonny Bollman, MD;  Location: [facility] OR;  Service: Open Heart Surgery;  Laterality: N/A;  Transfemoral   Family History:  Family History  Problem Relation Age of Onset   Diabetes Mother    Heart failure Father    Social History: Lives at home with his wife. 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 No alcohol, tobacco, illicit substance use. PCP is Dr. Phillips Odor.  Review of Systems: A complete ROS was negative except as per HPI.   Physical Exam: Blood pressure (!) 177/80, pulse (!) 50, temperature (!) 97.5 F (36.4 C), resp. rate 16, SpO2 99%. Constitutional:Appears stated age. In no acute distress. Cardio:Regular rate and rhythm.  Pulm:Clear to auscultation bilaterally. Normal work of breathing on room air. Abdomen:Soft, nontender, nondistended. MSK/Skin:RLE with redness/bruising over lateral malleolus and anteromedial shin. No drainage or fluctuance. No tenderness to palpation. Minimal increase in warmth compared to LLE. No streaking of redness appreciated. Some non-pitting swelling around area.  Neuro:Alert and oriented x3. Bilateral UE strength 5/5, bilateral LE strength 5/5. Cranial nerves grossly intact. EOMI. Wears glasses. Sensation is normal and equal to bilateral UE and LE. Altered sensation on L side of face along CN V3 distribution which is improved from symptom onset. Psych:Pleasant mood and affect.  EKG: personally reviewed my interpretation is sinus bradycardia (rate 50s).  CXR: personally reviewed my interpretation is no acute cardiopulmonary abnormalities.  CT Head: 1. No acute intracranial abnormality. Negative for age noncontrast CT appearance of the brain. 2. Chronic bilateral phthisis bulbi.  MRI  Brain: Small linear acute infarct in the right parietal lobe. No acute hemorrhage.  Assessment & Plan by Problem: Principal Problem:   Acute CVA (cerebrovascular accident) (HCC)  Acute R parietal lobe CVA Suspect in setting of sub-therapeutic anticoagulation. Deficit is altered sensation along dermal area of CN V3, otherwise no other gross neurological abnormalities. He was also quite hypertensive at home when symptoms occurred with SBP >200; per his reports, BP is typically pretty well controlled. Plan: -Neurology consulted, appreciate their assistance -Allow for permissive HTN for 24-48h -Will check HbA1c, lipid panel, echocardiogram -PT/OT consulted -Will consult pharmacy for warfarin dosing assistance  Hx MI s/p DES x3 HLD Chest tightness, resolved No palpitations recently but did have chest "tightness" when he first arrived though this has resolved and troponin levels were flat, 10>10. No acute concern for ACS. Plan: -Continue home zetia 10 mg daily -Continue home plavix 75 mg daily -Warfarin per pharmacy  Atrial fibrillation s/p DCCV Regular rhythm with bradycardia on my exam. OP regimen includes warfarin for anticoagulation, metoprolol 25 mg daily which he did take yesterday. HR based on chart review is consistently in low 50's. 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 No signs of volume overload on my exam. Plan: -F/u echocardiogram  Recent otitis media of L ear No complaints regarding his ear today. Did not take second dose of keflex yesterday which would have been the last pill of his 7-day treatment. Has been giving himself ciprodex ear drops as prescribed. Plan: -Will give  last dose of keflex 500 mg to complete therapy  Hx hemorrhoids Was admitted in August for rectal bleeding found to be 2/2 hemorrhoids. He has hydrocortisone suppositories which he uses as needed but has not had trouble related to hemorrhoids recently and is not regularly using the suppositories. Plan: -Monitor for bleeding as warfarin dose is adjusted   Leg abrasion I do not suspect cellulitis at this time based on my exam. Additionally he has been afebrile, no leukocytosis.  Plan: -Monitor leg for signs of developing infection  Dispo: Admit patient to Observation with expected length of stay less than 2 midnights.  Signed: Champ Mungo, DO 07/03/2023, 10:02 AM  After 5pm on weekdays and 1pm on weekends: On Call pager: [phone]

## 2023-07-03 NOTE — TOC Benefit Eligibility Note (Signed)
Patient Product/process development scientist completed.    The patient is insured through [employer]. Patient has Medicare and is not eligible for a copay card, but may be able to apply for patient assistance, if available.    Ran test claim for Eliquis 5 mg and the current 30 day co-pay is $439.07 due to a $545 deductible.   This test claim was processed through [facility]- copay amounts may vary at other pharmacies due to pharmacy/plan contracts, or as the patient moves through the different stages of their insurance plan.     Roland Earl, CPHT Pharmacy Technician III Certified Patient Advocate [facility] Patient Advocate Team Direct Number: [phone]  Fax: [phone]

## 2023-07-03 NOTE — ED Notes (Signed)
ED TO INPATIENT HANDOFF REPORT  ED Nurse Name and Phone #:  Theophilus Bones [phone]  S Name/Age/Gender Jonathan Romero 77 y.o. male Room/Bed: 039C/039C  Code Status   Code Status: Full Code  Home/SNF/Other Home Patient oriented to: self, place, time, and situation Is this baseline? Yes   Triage Complete: Triage complete  Chief Complaint Acute CVA (cerebrovascular accident) [facility]) [I63.9]  Triage Note Pt arrived from home via POV c/o left sided jaw pain x 2 days and felt clammy earlier. Pt denies chest pain. Pt has cardiac hx.   Allergies Allergies  Allergen Reactions   Doxycycline Other (See Comments)    Joint pain   Statins     Joint pain    Level of Care/Admitting Diagnosis ED Disposition     ED Disposition  Admit   Condition  --   Comment  Hospital Area: [facility] [[id number]]  Level of Care: Telemetry Medical [104]  May place patient in observation at [facility] or [facility] if equivalent level of care is available:: No  Covid Evaluation: Asymptomatic - no recent exposure (last 10 days) testing not required  Diagnosis: Acute CVA (cerebrovascular accident) [facility]) [[id number]]  Admitting Physician: Reymundo Poll [[id number]]  Attending Physician: Reymundo Poll [[id number]]          B Medical/Surgery History Past Medical History:  Diagnosis Date   Arthritis    Cellulitis    Coronary artery disease    s/p 3 overlapping DES from proximal-mid LAD 08/13/22   Dyslipidemia    GERD (gastroesophageal reflux disease)    Hyperlipidemia 09/23/2011   LDL particle number [id number] with LDL CALULATED at 151 and 2300 in 1988 was the small LDL particle number,which is very high- not tinterested in taking medications   Myocardial infarction (HCC)    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 Excell Seltzer & Weldner   Severe aortic stenosis    Past Surgical History:  Procedure  Laterality Date   CARDIOVERSION N/A 10/09/2022   Procedure: CARDIOVERSION;  Surgeon: Pricilla Riffle, MD;  Location[facility] ENDOSCOPY;  Service: Cardiovascular;  Laterality: N/A;   CATARACT EXTRACTION     CORONARY ATHERECTOMY N/A 08/13/2022   Procedure: CORONARY ATHERECTOMY;  Surgeon: Tonny Bollman, MD;  Location: [facility] INVASIVE CV LAB;  Service: Cardiovascular;  Laterality: N/A;   CORONARY STENT INTERVENTION N/A 08/13/2022   Procedure: CORONARY STENT INTERVENTION;  Surgeon: Tonny Bollman, MD;  Location: [facility] INVASIVE CV LAB;  Service: Cardiovascular;  Laterality: N/A;   DOPPLER ECHOCARDIOGRAPHY  12/21/2012   EF 55-60%,   DOPPLER ECHOCARDIOGRAPHY  12/16/2011   MILD AORTIC STENOSIS,PEAK AND MEAN GRADIENTS OF 18 AND 8 mmHg and a valve area of around 2 square cm.    INTRAOPERATIVE TRANSTHORACIC ECHOCARDIOGRAM N/A 08/19/2022   Procedure: INTRAOPERATIVE TRANSTHORACIC ECHOCARDIOGRAM;  Surgeon: Tonny Bollman, MD;  Location: [facility] OR;  Service: Open Heart Surgery;  Laterality: N/A;   RIGHT/LEFT HEART CATH AND CORONARY ANGIOGRAPHY N/A 08/11/2022   Procedure: RIGHT/LEFT HEART CATH AND CORONARY ANGIOGRAPHY;  Surgeon: Lyn Records, MD;  Location: [facility] INVASIVE CV LAB;  Service: Cardiovascular;  Laterality: N/A;   TEE WITHOUT CARDIOVERSION N/A 10/09/2022   Procedure: TRANSESOPHAGEAL ECHOCARDIOGRAM (TEE);  Surgeon: Pricilla Riffle, MD;  Location: [facility] ENDOSCOPY;  Service: Cardiovascular;  Laterality: N/A;   TRANSCATHETER AORTIC VALVE REPLACEMENT, TRANSFEMORAL N/A 08/19/2022   Procedure: Transcatheter Aortic Valve Replacement, Transfemoral using a 26 MM Edwards SAPIEN 3 Ultra;  Surgeon: Tonny Bollman, MD;  Location: [facility] OR;  Service: Open Heart Surgery;  Laterality: N/A;  Transfemoral     A IV Location/Drains/Wounds Patient Lines/Drains/Airways Status     Active Line/Drains/Airways     Name Placement date Placement time Site Days   Wound / Incision (Open or Dehisced) 08/10/22 Skin tear Elbow Posterior;Right  08/10/22  1308  Elbow  327            Intake/Output Last 24 hours No intake or output data in the 24 hours ending 07/03/23 1432  Labs/Imaging Results for orders placed or performed during the hospital encounter of 07/02/23 (from the past 48 hour(s))  Basic metabolic panel     Status: None   Collection Time: 07/02/23 11:32 PM  Result Value Ref Range   Sodium 136 135 - 145 mmol/L   Potassium 4.2 3.5 - 5.1 mmol/L   Chloride 99 98 - 111 mmol/L   CO2 25 22 - 32 mmol/L   Glucose, Bld 97 70 - 99 mg/dL    Comment: Glucose reference range applies only to samples taken after fasting for at least 8 hours.   BUN 11 8 - 23 mg/dL   Creatinine, Ser 1.61 0.61 - 1.24 mg/dL   Calcium 9.2 8.9 - 09.6 mg/dL   GFR, Estimated >04 >54 mL/min    Comment: (NOTE) Calculated using the CKD-EPI Creatinine Equation (2021)    Anion gap 12 5 - 15    Comment: Performed at [facility], [address] [place], [place] [address]  CBC     Status: Abnormal   Collection Time: 07/02/23 11:32 PM  Result Value Ref Range   WBC 6.9 4.0 - 10.5 K/uL   RBC 4.17 (L) 4.22 - 5.81 MIL/uL   Hemoglobin 13.8 13.0 - 17.0 g/dL   HCT 91.4 78.2 - 95.6 %   MCV 97.1 80.0 - 100.0 fL   MCH 33.1 26.0 - 34.0 pg   MCHC 34.1 30.0 - 36.0 g/dL   RDW 21.3 08.6 - 57.8 %   Platelets 210 150 - 400 K/uL   nRBC 0.0 0.0 - 0.2 %    Comment: Performed at [facility], [address] [place], [place] [address]  Troponin I (High Sensitivity)     Status: None   Collection Time: 07/02/23 11:32 PM  Result Value Ref Range   Troponin I (High Sensitivity) 10 <18 ng/L    Comment: (NOTE) Elevated high sensitivity troponin I (hsTnI) values and significant  changes across serial measurements may suggest ACS but many other  chronic and acute conditions are known to elevate hsTnI results.  Refer to the "Links" section for chest pain algorithms and additional  guidance. Performed at [facility], [address] [place],  [place] [address]   Protime-INR (order if Patient is taking Coumadin / Warfarin)     Status: Abnormal   Collection Time: 07/02/23 11:32 PM  Result Value Ref Range   Prothrombin Time 17.8 (H) 11.4 - 15.2 seconds   INR 1.4 (H) 0.8 - 1.2    Comment: (NOTE) INR goal varies based on device and disease states. Performed at [facility], [address], [place], [place] [address]   Troponin I (High Sensitivity)     Status: None   Collection Time: 07/03/23  2:34 AM  Result Value Ref Range   Troponin I (High Sensitivity) 10 <18 ng/L    Comment: (NOTE) Elevated high sensitivity troponin I (hsTnI) values and significant  changes across serial measurements may suggest ACS but many other  chronic and acute conditions are known to elevate hsTnI results.  Refer to the "Links" section for chest pain algorithms and additional  guidance. Performed at [facility], [address] [place], [place] [address]    MR BRAIN WO CONTRAST  Result Date: 07/03/2023 CLINICAL DATA:  Neuro deficit, acute, stroke suspected EXAM: MRI HEAD WITHOUT CONTRAST TECHNIQUE: Multiplanar, multiecho pulse sequences of the brain and surrounding structures were obtained without intravenous contrast. COMPARISON:  Same day CT head, brain MR 11/25/2018 FINDINGS: Brain: Small linear acute infarct in the right parietal lobe (series 2, image 35). No acute hemorrhage. No hydrocephalus. No extra-axial fluid collection. Background of mild chronic microvascular ischemic change. There is chronic infarct in the right cerebellum. Generalized volume loss without lobar predominance. Vascular: Normal flow voids. Skull and upper cervical spine: Normal marrow signal. Sinuses/Orbits: Trace bilateral mastoid effusions. Paranasal sinuses are clear. Bilateral lens replacement. Orbits are otherwise unremarkable. Other: None. IMPRESSION: Small linear acute infarct in the right parietal lobe. No acute hemorrhage. Electronically Signed   By: Lorenza Cambridge  M.D.   On: 07/03/2023 08:24   CT HEAD WO CONTRAST ( )  Result Date: 07/03/2023 CLINICAL DATA:  77 year old male with hypertension and neurologic deficit. Jaw pain. EXAM: CT HEAD WITHOUT CONTRAST TECHNIQUE: Contiguous axial images were obtained from the base of the skull through the vertex without intravenous contrast. RADIATION DOSE REDUCTION: This exam was performed according to the departmental dose-optimization program which includes automated exposure control, adjustment of the mA and/or kV according to patient size and/or use of iterative reconstruction technique. COMPARISON:  Brain MRI 11/25/2018.  Head CT 12/08/2018. FINDINGS: Brain: Mild cerebral volume loss since 2020 appears to be generalized. No midline shift, ventriculomegaly, mass effect, evidence of mass lesion, intracranial hemorrhage or evidence of cortically based acute infarction. Gray-white differentiation stable and within normal limits. Vascular: No suspicious intracranial vascular hyperdensity. Skull: No acute osseous abnormality identified. Sinuses/Orbits: Visualized paranasal sinuses and mastoids are stable and well aerated. Other: Bilateral phthisis bulbi. Visualized scalp soft tissues are within normal limits. IMPRESSION: 1. No acute intracranial abnormality. Negative for age noncontrast CT appearance of the brain. 2. Chronic bilateral phthisis bulbi. Electronically Signed   By: Odessa Fleming M.D.   On: 07/03/2023 06:40   DG Chest 2 View  Result Date: 07/02/2023 CLINICAL DATA:  Jaw pain. EXAM: CHEST - 2 VIEW COMPARISON:  Radiograph 08/18/2022.  CT 08/12/2022 FINDINGS: The heart is normal in size. Coronary stent visualized. TAVR. Aortic atherosclerosis. Chronic peripheral reticular changes in the right upper lung zone. No pulmonary edema, focal airspace disease, significant pleural effusion or pneumothorax. Mild broad-based scoliotic curvature of the spine. IMPRESSION: No acute chest findings. Electronically Signed   By: Narda Rutherford M.D.   On: 07/02/2023 23:53    Pending Labs Unresulted Labs (From admission, onward)     Start     Ordered   07/04/23 0500  Basic metabolic panel  Tomorrow morning,   R        07/03/23 0948   07/04/23 0500  CBC  Tomorrow morning,   R        07/03/23 0948   07/04/23 0500  Protime-INR  Daily,   R      07/03/23 1036   07/03/23 0948  Hemoglobin A1c  Once,   R        07/03/23 0948   07/03/23 0948  Lipid panel  Once,   R  07/03/23 0948            Vitals/Pain Today's Vitals   07/02/23 2321 07/03/23 0047 07/03/23 0807 07/03/23 1245  BP:  (!) 156/79 (!) 177/80 (!) 167/80  Pulse:  (!) 52 (!) 50 (!) 55  Resp:  16 16 15   Temp:  98.2 F (36.8 C) (!) 97.5 F (36.4 C)   TempSrc:  Oral    SpO2: 99% 99% 99% 100%  PainSc: 6        Isolation Precautions No active isolations  Medications Medications  acetaminophen (TYLENOL) tablet 650 mg (has no administration in time range)    Or  acetaminophen (TYLENOL) suppository 650 mg (has no administration in time range)  polyethylene glycol (MIRALAX / GLYCOLAX) packet 17 g (has no administration in time range)  clopidogrel (PLAVIX) tablet 75 mg (75 mg Oral Given 07/03/23 1110)  ezetimibe (ZETIA) tablet 10 mg (10 mg Oral Given 07/03/23 1110)  metoprolol succinate (TOPROL-XL) 24 hr tablet 25 mg (has no administration in time range)  warfarin (COUMADIN) tablet 5 mg (has no administration in time range)  warfarin (COUMADIN) tablet 2.5 mg (has no administration in time range)  Warfarin - Pharmacist Dosing Inpatient (has no administration in time range)   stroke: early stages of recovery book (has no administration in time range)  cephALEXin (KEFLEX) capsule 500 mg (500 mg Oral Given 07/03/23 1110)    Mobility walks     Focused Assessments Neuro Assessment Handoff:  Swallow screen pass? Yes    NIH Stroke Scale  Dizziness Present: No Headache Present: No Interval: Initial Level of Consciousness (1a.)   : Alert, keenly  responsive LOC Questions (1b. )   : Answers both questions correctly LOC Commands (1c. )   : Performs both tasks correctly Best Gaze (2. )  : Normal Visual (3. )  : No visual loss Facial Palsy (4. )    : Normal symmetrical movements Motor Arm, Left (5a. )   : Drift Motor Arm, Right (5b. ) : No drift Motor Leg, Left (6a. )  : No drift Motor Leg, Right (6b. ) : No drift Limb Ataxia (7. ): Absent Sensory (8. )  : Mild-to-moderate sensory loss, patient feels pinprick is less sharp or is dull on the affected side, or there is a loss of superficial pain with pinprick, but patient is aware of being touched Best Language (9. )  : No aphasia Dysarthria (10. ): Normal Extinction/Inattention (11.)   : No Abnormality Complete NIHSS TOTAL: 2     Neuro Assessment:   Neuro Checks:   Initial (07/03/23 0857)  Has TPA been given? No If patient is a Neuro Trauma and patient is going to OR before floor call report to 4N Charge nurse: [phone] or [phone]   R Recommendations: See Admitting Provider Note  Report given to:   Additional Notes:

## 2023-07-03 NOTE — Hospital Course (Addendum)
#  Acute right parietal lobe CVA Suspect in setting of sub-therapeutic anticoagulation. Deficit was altered sensation along dermal area of CN V3, otherwise no other gross neurological abnormalities. He was also quite hypertensive at home when symptoms occurred with SBP >200. Neurology consulted. CTA demonstrated no large vessel occlusion, hemodynamically significant stenosis, or aneurysm in the head or neck.  EKG and telemetry demonstrated NSR.  CT head demonstrated no acute intracranial abnormality. MRI brain demonstrated small linear acute infarct in the right parietal lobe with no acute hemorrhage.  Neurology did not recommend a TTE from a stroke standpoint as he recently had a TTE on 03/05/2023 with normal EF.  His INR was 1.4. For his anticoagulation, he follows with Dr. Swaziland and has been very compliant with his medications.  There is an unclear reason for his subtherapeutic levels so he is transitioning to a DOAC.  He was given a 30-day supply of Eliquis while pharmacy sent a prior authorization for this approval.  He was stable at discharge and will follow-up outpatient for his prior authorization of the Eliquis for continuation.  #Hx MI s/p DES x3 #HLD #Aortic stenosis s/p TAVR #Chest tightness, resolved No palpitations recently but did have chest "tightness" when he first arrived though this has resolved and troponin levels were flat, 10>10. No acute concern for ACS. Lipid panel showed elevated LDL of 170. Patient currently taking Zetia, Plavix, and warfarin. Warfarin subtherapeutic and attempting transition to Eliquis pending prior authorization. Neurology recommended adding Leqvio which will require authorization. Patient signed form and will follow-up outpatient pending this authorization.   #Atrial fibrillation s/p DCCV Regular rhythm with bradycardia on my exam. OP regimen includes warfarin for anticoagulation, metoprolol 25 mg daily which he did take yesterday. HR based on chart review is  consistently in low 50's. Home metoprolol 25 mg daily continued.   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 No signs of volume overload on my exam. Neurology did not recommend a repeat TTE. No concerns at discharge.   #Recent otitis media of L ear No complaints regarding his ear today. Did not take second dose of keflex yesterday which would have been the last pill of his 7-day treatment. Has been giving himself ciprodex ear drops as prescribed. Received his last dose of Keflex 500 mg to complete therapy.   #Hx hemorrhoids Was admitted in August for rectal bleeding found to be 2/2 hemorrhoids. He has hydrocortisone suppositories which he uses as needed but has not had trouble related to hemorrhoids recently and is not regularly using the suppositories. No concerns at discharge.

## 2023-07-03 NOTE — ED Provider Notes (Signed)
[place] EMERGENCY DEPARTMENT AT [facility] Provider Note   CSN: [id number] Arrival date & time: 07/02/23  2235     History  Chief Complaint  Patient presents with   Hypertension    Jonathan Romero is a 77 y.o. male.  Presents to the emergency department for evaluation of elevated blood pressure.  Patient reports that around dinnertime today started feeling a change in sensation on the left side of his face.  It felt " full" or " tight" but not painful.  He was able to eat dinner, did not appreciate any drooping of the face or speech disturbance.  He took his blood pressure multiple times and he was 200s over 140s.       Home Medications Prior to Admission medications   Medication Sig Start Date End Date Taking? Authorizing Provider  cephALEXin (KEFLEX) 500 MG capsule Take 1 capsule (500 mg total) by mouth 2 (two) times daily. 06/26/23   Particia Nearing, PA-C  ciprofloxacin-dexamethasone (CIPRODEX) OTIC suspension Place 4 drops into the left ear 2 (two) times daily. 06/26/23   Particia Nearing, PA-C  clopidogrel (PLAVIX) 75 MG tablet Take 1 tablet (75 mg total) by mouth daily. 08/14/22   Duke, Roe Rutherford, PA  ezetimibe (ZETIA) 10 MG tablet Take 1 tablet (10 mg total) by mouth daily. 08/14/22   Duke, Roe Rutherford, PA  furosemide (LASIX) 20 MG tablet Take 1 tablet (20 mg total) by mouth daily. Patient taking differently: Take 20 mg by mouth daily as needed for fluid or edema (if bottom bp number is above 80). 10/06/22   Janetta Hora, PA-C  hydrocortisone (ANUSOL-HC) 25 MG suppository Unwrap and place 1 suppository (25 mg total) rectally 2 (two) times daily. 05/15/23   Joseph Art, DO  hydrocortisone cream 1 % Apply 1 Application topically daily as needed (hemorrhoids).    [provider]  metoprolol succinate (TOPROL-XL) 25 MG 24 hr tablet Take 1 tablet (25 mg total) by mouth daily. 05/15/23   Joseph Art, DO  nitroGLYCERIN (NITROSTAT)  0.4 MG SL tablet Place 1 tablet (0.4 mg total) under the tongue every 5 (five) minutes x 3 doses as needed for chest pain. 08/14/22   Duke, Roe Rutherford, PA  polyethylene glycol powder (GLYCOLAX/MIRALAX) 17 GM/SCOOP powder Take 17 g by mouth daily. 05/15/23   Joseph Art, DO  warfarin (COUMADIN) 5 MG tablet TAKE 1 TABLET BY MOUTH ONCE DAILY OR AS DIRECTED BY ANTICOAGULATION CLINIC. 04/02/23   [place], Peter M, MD      Allergies    Doxycycline and Statins    Review of Systems   Review of Systems  Physical Exam Updated Vital Signs BP (!) 156/79 (BP Location: Left Arm)   Pulse (!) 52   Temp 98.2 F (36.8 C) (Oral)   Resp 16   SpO2 99%  Physical Exam Vitals and nursing note reviewed.  Constitutional:      General: He is not in acute distress.    Appearance: He is well-developed.  HENT:     Head: Normocephalic and atraumatic.     Mouth/Throat:     Mouth: Mucous membranes are moist.  Eyes:     General: Vision grossly intact. Gaze aligned appropriately.     Extraocular Movements: Extraocular movements intact.     Conjunctiva/sclera: Conjunctivae normal.  Cardiovascular:     Rate and Rhythm: Normal rate. Rhythm irregular.     Pulses: Normal pulses.     Heart sounds: Normal  heart sounds, S1 normal and S2 normal. No murmur heard.    No friction rub. No gallop.  Pulmonary:     Effort: Pulmonary effort is normal. No respiratory distress.     Breath sounds: Normal breath sounds.  Abdominal:     Palpations: Abdomen is soft.     Tenderness: There is no abdominal tenderness. There is no guarding or rebound.     Hernia: No hernia is present.  Musculoskeletal:        General: No swelling.     Cervical back: Full passive range of motion without pain, normal range of motion and neck supple. No pain with movement, spinous process tenderness or muscular tenderness. Normal range of motion.     Right lower leg: No edema.     Left lower leg: No edema.  Skin:    General: Skin is warm and  dry.     Capillary Refill: Capillary refill takes less than 2 seconds.     Findings: No ecchymosis, erythema, lesion or wound.  Neurological:     Mental Status: He is alert and oriented to person, place, and time.     GCS: GCS eye subscore is 4. GCS verbal subscore is 5. GCS motor subscore is 6.     Cranial Nerves: Cranial nerves 2-12 are intact.     Sensory: Sensation is intact.     Motor: Motor function is intact. No weakness or abnormal muscle tone.     Coordination: Coordination is intact.     Comments: No facial asymmetry, subjective slight alteration in sensation on the lower left side of face  Psychiatric:        Mood and Affect: Mood normal.        Speech: Speech normal.        Behavior: Behavior normal.     ED Results / Procedures / Treatments   Labs (all labs ordered are listed, but only abnormal results are displayed) Labs Reviewed  CBC - Abnormal; Notable for the following components:      Result Value   RBC 4.17 (*)    All other components within normal limits  PROTIME-INR - Abnormal; Notable for the following components:   Prothrombin Time 17.8 (*)    INR 1.4 (*)    All other components within normal limits  BASIC METABOLIC PANEL  TROPONIN I (HIGH SENSITIVITY)  TROPONIN I (HIGH SENSITIVITY)    EKG EKG Interpretation Date/Time:  Thursday July 02 2023 23:06:12 EDT Ventricular Rate:  53 PR Interval:  162 QRS Duration:  90 QT Interval:  426 QTC Calculation: 399 R Axis:   87  Text Interpretation: Sinus bradycardia with sinus arrhythmia Otherwise normal ECG When compared with ECG of 16-Oct-2022 10:37, No acute changes Confirmed by Gilda Crease [phone]) on 07/02/2023 11:14:45 PM  Radiology CT HEAD WO CONTRAST ( )  Result Date: 07/03/2023 CLINICAL DATA:  77 year old male with hypertension and neurologic deficit. Jaw pain. EXAM: CT HEAD WITHOUT CONTRAST TECHNIQUE: Contiguous axial images were obtained from the base of the skull through the vertex  without intravenous contrast. RADIATION DOSE REDUCTION: This exam was performed according to the departmental dose-optimization program which includes automated exposure control, adjustment of the mA and/or kV according to patient size and/or use of iterative reconstruction technique. COMPARISON:  Brain MRI 11/25/2018.  Head CT 12/08/2018. FINDINGS: Brain: Mild cerebral volume loss since 2020 appears to be generalized. No midline shift, ventriculomegaly, mass effect, evidence of mass lesion, intracranial hemorrhage or evidence of cortically based acute infarction.  Gray-white differentiation stable and within normal limits. Vascular: No suspicious intracranial vascular hyperdensity. Skull: No acute osseous abnormality identified. Sinuses/Orbits: Visualized paranasal sinuses and mastoids are stable and well aerated. Other: Bilateral phthisis bulbi. Visualized scalp soft tissues are within normal limits. IMPRESSION: 1. No acute intracranial abnormality. Negative for age noncontrast CT appearance of the brain. 2. Chronic bilateral phthisis bulbi. Electronically Signed   By: Odessa Fleming M.D.   On: 07/03/2023 06:40   DG Chest 2 View  Result Date: 07/02/2023 CLINICAL DATA:  Jaw pain. EXAM: CHEST - 2 VIEW COMPARISON:  Radiograph 08/18/2022.  CT 08/12/2022 FINDINGS: The heart is normal in size. Coronary stent visualized. TAVR. Aortic atherosclerosis. Chronic peripheral reticular changes in the right upper lung zone. No pulmonary edema, focal airspace disease, significant pleural effusion or pneumothorax. Mild broad-based scoliotic curvature of the spine. IMPRESSION: No acute chest findings. Electronically Signed   By: Narda Rutherford M.D.   On: 07/02/2023 23:53    Procedures Procedures    Medications Ordered in ED Medications - No data to display  ED Course/ Medical Decision Making/ A&P                                 Medical Decision Making Amount and/or Complexity of Data Reviewed Labs:  ordered. Radiology: ordered.   Differential diagnosis considered includes, but not limited to: Stroke; TIA; Hypertensive emergency; dentalgia  Presents to the emergency department for evaluation of elevated blood pressures with altered sensation of the left face.    Patient with history of atrial fibrillation, on warfarin, is subtherapeutic.  Must rule out stroke.  Patient's blood pressure is still high but has improved without intervention.  Would not treat until proven stroke negative.  CT screening exam negative.  Patient will undergo MRI to further evaluate.  Will sign out to oncoming ER physician.       Final Clinical Impression(s) / ED Diagnoses Final diagnoses:  Hypertension, unspecified type    Rx / DC Orders ED Discharge Orders     None         Elbie Statzer, Canary Brim, MD 07/03/23 [phone]

## 2023-07-03 NOTE — Consult Note (Signed)
NEUROLOGY CONSULT NOTE   Date of service: July 03, 2023 Patient Name: Jonathan Romero MRN:  [id number] DOB:  1945-11-02 Chief Complaint: "L facial numbness" Requesting Provider: Reymundo Poll, MD  History of Present Illness  Jonathan Romero is a 77 y.o. male A-fib on warfarin, CAD on Plavix, hyperlipidemia, GERD, MI, severe aortic stenosis status post TAVR who presents with sudden onset left facial numbness and elevated blood pressure.  Patient reports that yesterday morning he woke up and was at his baseline.  Somewhere in the late morning, he noted that his left lower face felt numb.  He touch the right side of his face when compared to the left side and it is felt odd.  He also reports that the inside of his mouth felt a little bit off to.  He got his blood pressure at that time and noted that it was elevated to 150s.  He waited for a few hours and got a repeat blood pressure and it was elevated to 200s and then got another 1 which was also elevated to 200s.  With the persistent facial numbness and hypertension, he decided to come to the ED.  He is fairly independent, reports that he recently fixed his lawnmower and change the blades on it.  Son at bedside reports the patient routinely gets up on his roof to clean up his roof and is able to do all ADLs by himself.  No prior history of strokes, no history of diabetes, patient endorses he does have hypertension and he does have hyperlipidemia.  He reports that he has tried several different statins in the past and has had significant myalgias and therefore stopped taking statins.   LKW: yesterday AM, unable to get an exact time from patient. Modified rankin score: 0-Completely asymptomatic and back to baseline post- stroke IV Thrombolysis: Not offered as he is outside of window.   EVT: Not offered as he is outside the window and low suspicion for LVO.  NIHSS components Score: Comment  1a Level of Conscious 0[x]  1[]  2[]  3[]      1b LOC  Questions 0[x]  1[]  2[]       1c LOC Commands 0[x]  1[]  2[]       2 Best Gaze 0[x]  1[]  2[]       3 Visual 0[x]  1[]  2[]  3[]      4 Facial Palsy 0[x]  1[]  2[]  3[]      5a Motor Arm - left 0[x]  1[]  2[]  3[]  4[]  UN[]    5b Motor Arm - Right 0[x]  1[]  2[]  3[]  4[]  UN[]    6a Motor Leg - Left 0[x]  1[]  2[]  3[]  4[]  UN[]    6b Motor Leg - Right 0[x]  1[]  2[]  3[]  4[]  UN[]    7 Limb Ataxia 0[x]  1[]  2[]  3[]  UN[]     8 Sensory 0[x]  1[]  2[]  UN[]    Subjective left lower facial numbness without any objective deficit.  9 Best Language 0[x]  1[]  2[]  3[]      10 Dysarthria 0[x]  1[]  2[]  UN[]      11 Extinct. 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 Excell Seltzer & Weldner   Severe aortic stenosis     Past Surgical History:  Procedure Laterality Date   CARDIOVERSION N/A 10/09/2022   Procedure: CARDIOVERSION;  Surgeon: Pricilla Riffle, MD;  Location: [facility] ENDOSCOPY;  Service: Cardiovascular;  Laterality: N/A;   CATARACT EXTRACTION     CORONARY ATHERECTOMY N/A 08/13/2022   Procedure: CORONARY ATHERECTOMY;  Surgeon: Tonny Bollman, MD;  Location: [facility] INVASIVE CV LAB;  Service: Cardiovascular;  Laterality: N/A;   CORONARY STENT INTERVENTION N/A 08/13/2022   Procedure: CORONARY STENT INTERVENTION;  Surgeon: Tonny Bollman, MD;  Location: [facility] INVASIVE CV LAB;  Service: Cardiovascular;   Laterality: N/A;   DOPPLER ECHOCARDIOGRAPHY  12/21/2012   EF 55-60%,   DOPPLER ECHOCARDIOGRAPHY  12/16/2011   MILD AORTIC STENOSIS,PEAK AND MEAN GRADIENTS OF 18 AND 8 mmHg and a valve area of around 2 square cm.    INTRAOPERATIVE TRANSTHORACIC ECHOCARDIOGRAM N/A 08/19/2022   Procedure: INTRAOPERATIVE TRANSTHORACIC ECHOCARDIOGRAM;  Surgeon: Tonny Bollman, MD;  Location: [facility] OR;  Service: Open Heart Surgery;  Laterality: N/A;   RIGHT/LEFT HEART CATH AND CORONARY ANGIOGRAPHY N/A 08/11/2022   Procedure: RIGHT/LEFT HEART CATH AND CORONARY ANGIOGRAPHY;  Surgeon: Lyn Records, MD;  Location: [facility] INVASIVE CV LAB;  Service: Cardiovascular;  Laterality: N/A;   TEE WITHOUT CARDIOVERSION N/A 10/09/2022   Procedure: TRANSESOPHAGEAL ECHOCARDIOGRAM (TEE);  Surgeon: Pricilla Riffle, MD;  Location: [facility] ENDOSCOPY;  Service: Cardiovascular;  Laterality: N/A;   TRANSCATHETER AORTIC VALVE REPLACEMENT, TRANSFEMORAL N/A 08/19/2022   Procedure: Transcatheter Aortic Valve Replacement, Transfemoral using a 26 MM Edwards SAPIEN 3 Ultra;  Surgeon: Tonny Bollman, MD;  Location: [facility] OR;  Service: Open Heart Surgery;  Laterality: N/A;  Transfemoral    Family History: Family History  Problem Relation Age of Onset   Diabetes Mother    Heart failure Father     Social History  reports that he quit smoking about 40 years ago. His smoking use included cigarettes. He has never used smokeless tobacco. He reports current alcohol use. He reports that he does not use drugs.  Allergies  Allergen Reactions   Doxycycline Other (See Comments)    Joint pain   Statins     Joint pain    Medications   Current Facility-Administered Medications:     stroke: early stages of recovery book, , Does not apply, Once, Erick Blinks, MD   acetaminophen (TYLENOL) tablet 650 mg, 650 mg, Oral, Q6H PRN **OR** acetaminophen (TYLENOL) suppository 650 mg, 650 mg, Rectal, Q6H PRN, Champ Mungo, DO   clopidogrel (PLAVIX) tablet 75 mg, 75 mg,  Oral, Daily, Champ Mungo, DO, 75 mg at 07/03/23 1110   ezetimibe (ZETIA) tablet 10 mg, 10 mg, Oral, Daily, Champ Mungo, DO, 10 mg at 07/03/23 1110   metoprolol succinate (TOPROL-XL) 24 hr tablet 25 mg, 25 mg, Oral, Daily, Champ Mungo, DO, 25 mg at 07/03/23 1501   polyethylene glycol (MIRALAX / GLYCOLAX) packet 17 g, 17 g, Oral, Daily PRN, Champ Mungo, DO   [START ON 07/05/2023] warfarin (COUMADIN) tablet 2.5 mg, 2.5 mg, Oral, Once per day on Sunday Thursday, Cedric Fishman, [facility]   warfarin (COUMADIN) tablet 5 mg, 5 mg, Oral, Once per day on Monday Tuesday Wednesday  Friday SaturdayCedric Fishman, [facility]   Warfarin - Pharmacist Dosing Inpatient, , Does not apply, q1600, Cedric Fishman, [facility]  Current Outpatient Medications:    cephALEXin (KEFLEX) 500 MG capsule, Take 1 capsule (500 mg total) by mouth 2 (two) times daily., Disp: 14 capsule, Rfl: 0   ciprofloxacin-dexamethasone (CIPRODEX) OTIC suspension, Place 4 drops into the left ear 2 (two) times daily., Disp: 7.5 mL, Rfl: 0   clopidogrel (PLAVIX) 75 MG tablet, Take 1 tablet (75 mg total) by mouth daily., Disp: 90 tablet, Rfl: 3   ezetimibe (ZETIA) 10 MG tablet, Take 1 tablet (10 mg total) by mouth daily., Disp: 90 tablet, Rfl: 3   furosemide (LASIX) 20 MG tablet, Take 1 tablet (20 mg total) by mouth daily. (Patient taking differently: Take 20 mg by mouth daily as needed for fluid or edema (if bottom bp number is above 80).), Disp: 30 tablet, Rfl: 6   hydrocortisone (ANUSOL-HC) 25 MG suppository, Unwrap and place 1 suppository (25 mg total) rectally 2 (two) times daily., Disp: 20 suppository, Rfl: 0   hydrocortisone cream 1 %, Apply 1 Application topically daily as needed (hemorrhoids)., Disp: , Rfl:    metoprolol succinate (TOPROL-XL) 25 MG 24 hr tablet, Take 1 tablet (25 mg total) by mouth daily. (Patient taking differently: 2 tablets daily.), Disp: 30 tablet, Rfl: 0   nitroGLYCERIN (NITROSTAT) 0.4 MG SL tablet, Place 1 tablet (0.4 mg total) under the  tongue every 5 (five) minutes x 3 doses as needed for chest pain., Disp: 25 tablet, Rfl: 2   polyethylene glycol powder (GLYCOLAX/MIRALAX) 17 GM/SCOOP powder, Take 17 g by mouth daily., Disp: 238 g, Rfl: 0   warfarin (COUMADIN) 5 MG tablet, TAKE 1 TABLET BY MOUTH ONCE DAILY OR AS DIRECTED BY ANTICOAGULATION CLINIC., Disp: 100 tablet, Rfl: 1  Vitals   Vitals:   07/03/23 0807 07/03/23 1245 07/03/23 1500 07/03/23 1501  BP: (!) 177/80 (!) 167/80 125/75 125/75  Pulse: (!) 50 (!) 55  (!) 52  Resp: 16 15 17    Temp: (!) 97.5 F (36.4 C)     TempSrc:      SpO2: 99% 100% 100%     There is no height or weight on file to calculate BMI.  Physical Exam   Constitutional: Appears well-developed and well-nourished.  Psych: Affect appropriate to situation.  Eyes: No scleral injection.  HENT: No OP obstruction.  Head: Normocephalic.  Cardiovascular: Normal rate and regular rhythm.  Respiratory: Effort normal, non-labored breathing.  GI: Soft.  No distension. There is no tenderness.  Skin: WDI.   Neurologic Examination   Mental status/Cognition: Alert, oriented to self, place, month and year, good attention.  Speech/language: Fluent, comprehension intact, object naming intact, repetition intact.  Cranial nerves:   CN II Pupils equal and reactive to light, no VF deficits    CN III,IV,VI EOM intact, no gaze preference or deviation, no nystagmus    CN V normal sensation in V1, V2, and V3 segments bilaterally    CN VII no asymmetry, no nasolabial fold flattening    CN VIII normal hearing to speech    CN IX & X normal palatal elevation, no uvular deviation    CN XI 5/5 head turn and 5/5 shoulder shrug bilaterally    CN XII midline tongue protrusion    Motor:  Muscle bulk: normal, tone normal, pronator drift none tremor none Mvmt Root Nerve  Muscle Right Left Comments  SA C5/6 Ax Deltoid 5 5   EF C5/6 Mc Biceps  5 5   EE C6/7/8 Rad Triceps 5 5   WF C6/7 Med FCR     WE C7/8 PIN ECU     F Ab  C8/T1 U ADM/FDI 5 5   HF L1/2/3 Fem Illopsoas 5 5   KE L2/3/4 Fem Quad 5 5   DF L4/5 D Peron Tib Ant 5 5   PF S1/2 Tibial Grc/Sol 5 5    Sensation:  Light touch Intact throughout   Pin prick    Temperature    Vibration   Proprioception    Coordination/Complex Motor:  - Finger to Nose intact bilaterally - Heel to shin into bilaterally - Rapid alternating movement are normal - Gait: Deferred for patient safety.  Labs   CBC:  Recent Labs  Lab 07/02/23 2332  WBC 6.9  HGB 13.8  HCT 40.5  MCV 97.1  PLT 210    Basic Metabolic Panel:  Lab Results  Component Value Date   NA 136 07/02/2023   K 4.2 07/02/2023   CO2 25 07/02/2023   GLUCOSE 97 07/02/2023   BUN 11 07/02/2023   CREATININE 0.77 07/02/2023   CALCIUM 9.2 07/02/2023   GFRNONAA >60 07/02/2023   GFRAA >60 12/08/2018   Lipid Panel:  Lab Results  Component Value Date   LDLCALC 160 (H) 08/10/2022   HgbA1c:  Lab Results  Component Value Date   HGBA1C 5.1 08/10/2022   Urine Drug Screen:     Component Value Date/Time   LABOPIA NONE DETECTED 11/25/2018 0257   COCAINSCRNUR NONE DETECTED 11/25/2018 0257   LABBENZ NONE DETECTED 11/25/2018 0257   AMPHETMU NONE DETECTED 11/25/2018 0257   THCU NONE DETECTED 11/25/2018 0257   LABBARB NONE DETECTED 11/25/2018 0257    Alcohol Level     Component Value Date/Time   ETH <10 12/08/2018 1552   INR  Lab Results  Component Value Date   INR 1.4 (H) 07/02/2023   APTT  Lab Results  Component Value Date   APTT 28 08/09/2022   AED levels: No results found for: "PHENYTOIN", "ZONISAMIDE", "LAMOTRIGINE", "LEVETIRACETA"   CT Head without contrast(Personally reviewed): CTH was negative for a large hypodensity concerning for a large territory infarct or hyperdensity concerning for an ICH  CT angio Head and Neck with contrast: pending  MRI Brain(Personally reviewed): Small acute R parietal subcortical stroke  Impression   RYELAN KAZEE is a 77 y.o. 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  Etiology of his stroke is likely small vessel disease.  He is on maximal medical management at this time and unlikely to benefit from a full stroke workup.  Recommendations  - LDL and if elevated, consider PCSK9 inhibitors outpatient for hyperlipidemia. Continue Zetia in the meantime. - HbA1c pending. - Will get CTA but if this is negative for significant stenosis of a major blood vessel, he is unlikely to benefit from further workup. - while in the hospital, will get neuro checks and NIHSS - no need for TTE from a stroke standpoint, recently had a TTE on 03/05/23 with normal EF.  - team working on switching him to eliquis from Warfarin. I think this would be helpful for the patient as it will reduce the need for routine INR checks. ______________________________________________________________________    Welton Flakes

## 2023-07-03 NOTE — ED Notes (Addendum)
Pt transported to MRI 

## 2023-07-04 ENCOUNTER — Other Ambulatory Visit (HOSPITAL_COMMUNITY): Payer: Self-pay

## 2023-07-04 ENCOUNTER — Other Ambulatory Visit: Payer: Self-pay | Admitting: Neurology

## 2023-07-04 DIAGNOSIS — I63411 Cerebral infarction due to embolism of right middle cerebral artery: Secondary | ICD-10-CM

## 2023-07-04 DIAGNOSIS — I639 Cerebral infarction, unspecified: Secondary | ICD-10-CM | POA: Diagnosis not present

## 2023-07-04 LAB — BASIC METABOLIC PANEL
Anion gap: 9 (ref 5–15)
BUN: 19 mg/dL (ref 8–23)
CO2: 25 mmol/L (ref 22–32)
Calcium: 8.9 mg/dL (ref 8.9–10.3)
Chloride: 101 mmol/L (ref 98–111)
Creatinine, Ser: 0.87 mg/dL (ref 0.61–1.24)
GFR, Estimated: >60 mL/min (ref >60)
Glucose, Bld: 92 mg/dL (ref 70–99)
Potassium: 4 mmol/L (ref 3.5–5.1)
Sodium: 135 mmol/L (ref 135–145)

## 2023-07-04 LAB — CBC
HCT: 42.3 % (ref 39.0–52.0)
Hemoglobin: 14.6 g/dL (ref 13.0–17.0)
MCH: 33.8 pg (ref 26.0–34.0)
MCHC: 34.5 g/dL (ref 30.0–36.0)
MCV: 97.9 fL (ref 80.0–100.0)
Platelets: 202 10*3/uL (ref 150–400)
RBC: 4.32 MIL/uL (ref 4.22–5.81)
RDW: 12.4 % (ref 11.5–15.5)
WBC: 6.8 10*3/uL (ref 4.0–10.5)
nRBC: 0 % (ref 0.0–0.2)

## 2023-07-04 LAB — PROTIME-INR
INR: 1.8 — ABNORMAL HIGH (ref 0.8–1.2)
Prothrombin Time: 21.3 s — ABNORMAL HIGH (ref 11.4–15.2)

## 2023-07-04 MED ORDER — APIXABAN 5 MG PO TABS
5.0000 mg | ORAL_TABLET | Freq: Two times a day (BID) | ORAL | 0 refills | Status: DC
  Filled 2023-07-04: qty 60, 30d supply, fill #0

## 2023-07-04 MED ORDER — FUROSEMIDE 20 MG PO TABS: 20.0000 mg | ORAL_TABLET | Freq: Every day | ORAL | Status: DC | PRN

## 2023-07-04 NOTE — Evaluation (Signed)
Physical Therapy Brief Evaluation and Discharge Note Patient Details Name: Jonathan Romero MRN: [id number] DOB: 1946/06/10 Today's Date: 07/04/2023   History of Present Illness  Pt is 77 yo male presenting to [facility] ER for evaluation of elevated blood pressure. Pt states he started to feel a change in sensation in the L side of his face. He took his BP multiple times and it was 200's over 140's. MRI positive for small inear acute infarct in the R parietal lobe per MD note. PMH: A fib s/p DCCV, severe aortic stenosis s/p TAVR, MI s/p DES x 3, HLD.  Clinical Impression  Pt is presenting close to baseline level of functioning. Gait speed is slightly slowed and pt states he normally walks quicker but reports he has been in the bed. Discussed speaking with PCP about OPPT if pt continues to feel that he is ambulating slower and balance is off. Pt was WNL for coordination testing including toe taps and RAMPs. No recommendations for skilled physical therapy services at this time. Will sign off for skilled physical therapy services in acute care hospital setting; please re-consult if further needs arise.      PT Assessment Patient does not need any further PT services  Assistance Needed at Discharge  PRN    Equipment Recommendations None recommended by PT     Precautions/Restrictions Precautions Precautions: None Restrictions Weight Bearing Restrictions: No        Mobility  Bed Mobility Rolling: Independent Supine/Sidelying to sit: Independent Sit to supine/sidelying: Independent    Transfers Overall transfer level: Independent Equipment used: None        Ambulation/Gait Ambulation/Gait assistance: Independent Gait Distance (Feet): 250 Feet Assistive device: None Gait Pattern/deviations: WFL(Within Functional Limits) Gait Speed: Pace WFL    Home Activity Instructions    Stairs Stairs: Yes Stairs assistance: Modified independent (Device/Increase time) Stair Management: One  rail Right, Alternating pattern Number of Stairs: 4 General stair comments: initially pt tried without rail and had some balance issues descending stairs; wanted to try again with rail and was mod I  Modified Rankin (Stroke Patients Only) Modified Rankin (Stroke Patients Only) Pre-Morbid Rankin Score: No symptoms Modified Rankin: No significant disability      Balance Overall balance assessment: No apparent balance deficits (not formally assessed)        Pertinent Vitals/Pain   Pain Assessment Pain Assessment: No/denies pain     Home Living Family/patient expects to be discharged to:: Private residence Living Arrangements: Spouse/significant other Available Help at Discharge: Family;Available PRN/intermittently (son lives nearby) Home Environment: Stairs to enter;Other (comment) (grab bars on the R for spouse)  Servando [place] of Steps: 4 Home Equipment: Grab bars - toilet;Grab bars - tub/shower;Rolling Walker (2 wheels);Shower seat;Other (comment)   Additional Comments: lives at home with wife who has complex medical history    Prior Function Level of Independence: Independent Comments: mowing yard, manages medication/finances, completely independent without AD.    UE/LE Assessment   UE ROM/Strength/Tone/Coordination: WFL    LE ROM/Strength/Tone/Coordination: [facility]      Communication   Communication Communication: No apparent difficulties     Cognition Overall Cognitive Status: Appears within functional limits for tasks assessed/performed       General Comments General comments (skin integrity, edema, etc.): No noted deficits, son present throughout session and supportive.      No Skilled PT Patient is independent with all acitivity/mobility    AMPAC 6 Clicks Help needed turning from your back to your side while in a flat bed  without using bedrails?: None Help needed moving from lying on your back to sitting on the side of a flat bed without using  bedrails?: None Help needed moving to and from a bed to a chair (including a wheelchair)?: None Help needed standing up from a chair using your arms (e.g., wheelchair or bedside chair)?: None Help needed to walk in hospital room?: None Help needed climbing 3-5 steps with a railing? : None 6 Click Score: 24      End of Session Equipment Utilized During Treatment: Gait belt Activity Tolerance: Patient tolerated treatment well Patient left: in bed;with call bell/phone within reach;with family/visitor present Nurse Communication: Mobility status       Time: 7846-9629 PT Time Calculation (min) (ACUTE ONLY): 15 min  Charges:   PT Evaluation $PT Eval Low Complexity: 1 Low     Harrel Carina, DPT, CLT  Acute Rehabilitation Services Office: [phone] (Secure chat preferred)   Claudia Desanctis  07/04/2023, 12:09 PM

## 2023-07-04 NOTE — Progress Notes (Addendum)
STROKE TEAM PROGRESS NOTE   BRIEF HPI Mr. Jonathan Romero is a 77 y.o. male A-fib on warfarin, CAD on Plavix, hyperlipidemia, GERD, MI, severe aortic stenosis status post TAVR who presents with sudden onset left facial numbness and elevated blood pressure. Unclear exactly when the numbness started, late morning versus later in the day.  No prior history of strokes, no history of diabetes, does have hypertension and hyperlipidemia.No TNK d/t unclear LKW, No intervention d/t outside of window and low suspicion for LVO.   INTERIM HISTORY/SUBJECTIVE Son, Lyda Jester, at bedside.  Patient alert and oriented to place, time, situation. No focal weakness. Denies numbness.  Patient was going to be on eliquis after his stents last year, but stated it costs too much. INR low on admission here. Patient describes difficulty with keeping up with dietary restrictions while on warfarin. We reviewed the copay cards and costs after that with the patient and his son, they were agreeable to switch to Eliquis. We also discussed Leqvio with the patient and his son. Given the insurance authorization form to complete.    OBJECTIVE  CBC    Component Value Date/Time   WBC 6.8 07/04/2023 0422   RBC 4.32 07/04/2023 0422   HGB 14.6 07/04/2023 0422   HGB 13.7 10/01/2022 1417   HCT 42.3 07/04/2023 0422   HCT 41.6 10/01/2022 1417   PLT 202 07/04/2023 0422   PLT 170 10/01/2022 1417   MCV 97.9 07/04/2023 0422   MCV 100 (H) 10/01/2022 1417   MCH 33.8 07/04/2023 0422   MCHC 34.5 07/04/2023 0422   RDW 12.4 07/04/2023 0422   RDW 12.2 10/01/2022 1417   LYMPHSABS 1.6 05/13/2023 1525   MONOABS 0.5 05/13/2023 1525   EOSABS 0.2 05/13/2023 1525   BASOSABS 0.1 05/13/2023 1525    BMET    Component Value Date/Time   NA 135 07/04/2023 0422   NA 139 10/01/2022 1417   K 4.0 07/04/2023 0422   CL 101 07/04/2023 0422   CO2 25 07/04/2023 0422   GLUCOSE 92 07/04/2023 0422   BUN 19 07/04/2023 0422   BUN 29 (H) 10/01/2022 1417    CREATININE 0.87 07/04/2023 0422   CREATININE 0.82 03/21/2013 1142   CALCIUM 8.9 07/04/2023 0422   EGFR 78 10/01/2022 1417   GFRNONAA >60 07/04/2023 0422    IMAGING past 24 hours CT ANGIO HEAD NECK W WO CM  Result Date: 07/03/2023 CLINICAL DATA:  Neuro deficit, acute, stroke suspected. EXAM: CT ANGIOGRAPHY HEAD AND NECK WITH AND WITHOUT CONTRAST TECHNIQUE: Multidetector CT imaging of the head and neck was performed using the standard protocol during bolus administration of intravenous contrast. Multiplanar CT image reconstructions and MIPs were obtained to evaluate the vascular anatomy. Carotid stenosis measurements (when applicable) are obtained utilizing NASCET criteria, using the distal internal carotid diameter as the denominator. RADIATION DOSE REDUCTION: This exam was performed according to the departmental dose-optimization program which includes automated exposure control, adjustment of the mA and/or kV according to patient size and/or use of iterative reconstruction technique. CONTRAST:  75mL OMNIPAQUE IOHEXOL 350 MG/ML SOLN COMPARISON:  MRI brain 07/03/2023. FINDINGS: CTA NECK FINDINGS Aortic arch: Incompletely imaged. Atherosclerotic vascular changes along the proximal left subclavian artery without significant stenosis. Right carotid system: No evidence of dissection, stenosis (50% or greater), or occlusion. Mild calcified plaque along the right carotid bulb and proximal right cervical ICA. Left carotid system: No evidence of dissection, stenosis (50% or greater), or occlusion. Mild calcified plaque along the left carotid bulb and proximal  left cervical ICA. Vertebral arteries: Codominant. No evidence of dissection, stenosis (50% or greater), or occlusion. Mild calcified plaque along the bilateral vertebral artery origins. Skeleton: Acquired fusion from C2-C6.  No suspicious bone lesions. Other neck: Bilateral microphthalmia. Upper chest: Unremarkable. Review of the MIP images confirms the  above findings CTA HEAD FINDINGS Anterior circulation: Calcified plaque along the carotid siphons without hemodynamically significant stenosis. The proximal ACAs and MCAs are patent without stenosis or aneurysm. Distal branches are symmetric. Posterior circulation: Normal basilar artery. The SCAs, AICAs and PICAs are patent proximally. The PCAs are patent proximally without stenosis or aneurysm. Distal branches are symmetric. Venous sinuses: As permitted by contrast timing, patent. Anatomic variants: None. Review of the MIP images confirms the above findings IMPRESSION: 1. No large vessel occlusion, hemodynamically significant stenosis, or aneurysm in the head or neck. 2. Bilateral microphthalmia. Aortic Atherosclerosis (ICD10-I70.0). Electronically Signed   By: Orvan Falconer M.D.   On: 07/03/2023 18:14    Vitals:   07/03/23 2011 07/03/23 2121 07/03/23 2330 07/04/23 0313  BP: 135/66  (!) 162/99 (!) 149/80  Pulse: (!) 56  (!) 59 (!) 56  Resp: 18  18 18   Temp: 97.9 F (36.6 C)  98.1 F (36.7 C) 98 F (36.7 C)  TempSrc: Oral  Oral Oral  SpO2: 99%  98% 97%  Height:  5\' 10"  (1.778 m)       PHYSICAL EXAM General:  Alert, well-nourished, well-developed patient in no acute distress Psych:  Mood and affect appropriate for situation CV: Regular rate and rhythm on monitor Respiratory:  Regular, unlabored respirations on room air GI: Abdomen soft and nontender   NEURO:  Mental Status: AA&Ox3, patient is able to give clear and coherent history, good attention. Speech/Language: speech is without dysarthria or aphasia.  Naming, repetition, fluency, and comprehension intact.  Cranial Nerves:  II: PERRL. Visual fields full.  III, IV, VI: EOMI. No gaze preference ro nystagmus. Eyelids elevate symmetrically.  V: Sensation is intact to light touch and symmetrical to face.  VII: Face is symmetrical resting and smiling VIII: hearing intact to voice. IX, X: Palate elevates symmetrically. Phonation is  normal.  ZO:[id number] shrug 5/5. XII: tongue is midline without fasciculations. Motor: 5/5 strength to all muscle groups tested.  Tone: is normal and bulk is normal Sensation- Intact to light touch bilaterally. Extinction absent to light touch to DSS.   Coordination: FTN intact bilaterally, HKS: no ataxia in BLE.No drift.  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  CTA head & neck: No LVO, significant stenosis or aneurysm.  Bilateral ICA bulb atherosclerosis MRI: Small linear acute infarct in the right parietal lobe. No acute hemorrhage.  2D Echo 03/05/23: EF 60-65%, Mild to Moderately Dilated Left Atria LDL 170 HgbA1c 5.1 VTE prophylaxis - warfarin warfarin daily prior to admission, today INR 1.8, will transition patient to Eliquis Therapy recommendations: None Disposition: Home  CAD and non-STEMI s/p LAD PCI/DES x3 Severe AS s/p TAVR Atrial Fibrillation November 2023 Placed on warfarin post procedure On warfarin PTA, INR fluctuated at home On admission INR 1.4, now 1.8 Will transition to Eliquis   Hypertension Home meds:  Toprol-XL, Lasix 20mg  Stable BP Goal: Normotensive  Hyperlipidemia Home meds:  Zetia, resumed in hospital LDL 170, goal < 70 Patient intolerance to multiple statins. Consider adding Leqvio. Patient completing insurance authorization form, will be contacted if approved. Continue  Zetia on discharge  Other Stroke Risk Factors Advanced Age Former smoker   Pending PT/OT eval, patient is cleared from neurology to be discharged with recommendations as above. He will need to follow up with neurology in 6-8 weeks. Ambulatory referral has been ordered.   Hospital day # 0   Pt seen by Neuro NP/APP and later by MD. Note/plan to be edited by MD as needed.    Lynnae January, DNP, AGACNP-BC [facility] Please use AMION for contact information & EPIC for messaging.  ATTENDING NOTE: I reviewed above note and agree with the assessment and plan. Pt was seen and examined.   Son at bedside.  Patient lying in bed, neuro intact.  Stated that his left facial numbness had resolved.  BP stable now.  He had non-STEMI and status post TEVAR procedure in 07/2022, postprocedure found to have A-fib, not able to afford Eliquis, put on warfarin.  However INR fluctuate at home.  This time on admission INR 1.4, MRI showed punctate right MCA/ACA infarct.  Concerning for cardioembolic source with subtherapeutic INR.  Will transition to Eliquis, discussed with pharmacy, case manager and social worker to help medication cause.  Patient also intolerant to statin, currently LDL 170, will do prior authorization for Leqvio.  Continue statin discharge.  PT and OT no recommendation.  For detailed assessment and plan, please refer to above/below as I have made changes wherever appropriate.   Neurology will sign off. Please call with questions. Pt will follow up with stroke clinic NP at [facility] in about 4 weeks. Thanks for the consult.   Marvel Plan, MD PhD Stroke Neurology 07/04/2023 9:58 PM    To contact Stroke Continuity provider, please refer to [email or web address]. After hours, contact General Neurology

## 2023-07-04 NOTE — Progress Notes (Signed)
PHARMACY - ANTICOAGULATION CONSULT NOTE  Pharmacy Consult for warfarin Indication: atrial fibrillation  Allergies  Allergen Reactions   Doxycycline Other (See Comments)    Joint pain   Statins     Joint pain    Patient Measurements: Height: 5\' 10"  (177.8 cm) IBW/kg (Calculated) : 73Height: 5\' 10"  (177.8 cm) Weight: 64.5 kg (142 lb 3.2 oz) IBW/kg (Calculated) : 73  Vital Signs: Temp: 98 F (36.7 C) (10/12 0313) Temp Source: Oral (10/12 0313) BP: 149/80 (10/12 0313) Pulse Rate: 56 (10/12 0313)  Labs: Recent Labs    07/02/23 2332 07/03/23 0234 07/04/23 0422  HGB 13.8  --  14.6  HCT 40.5  --  42.3  PLT 210  --  202  LABPROT 17.8*  --  21.3*  INR 1.4*  --  1.8*  CREATININE 0.77  --  0.87  TROPONINIHS 10 10  --     CrCl cannot be calculated (Unknown ideal weight.).   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 Excell Seltzer & Weldner   Severe aortic stenosis     Medications:  Scheduled:   clopidogrel  75 mg Oral Daily   ezetimibe  10 mg Oral Daily   metoprolol succinate  25 mg Oral Daily   [START ON 07/05/2023] warfarin  2.5 mg Oral Once per day on Sunday Thursday   warfarin  5 mg Oral Once per day on Monday Tuesday Wednesday Friday Saturday   Warfarin - Pharmacist Dosing Inpatient   Does not apply q1600    Assessment: Patient presents with Acute R parietal lobe CVA. He has atrial fibrillation and is managed in an anti-coag clinic. His last  visit to the clinic was 05/26/23. He is seeing them every 4 weeks and was due for appointment on 10/14. At last appointment his INR was 2.8 at that time. No changes to warfarin dose were made, but patient reports that he was told to eat "sauerkraut weekly". His INR was 1.4 on presentation to ED. He also reports being on a "red pill for ear mites" recently. This is likely the 500 mg keflex prescription he received recently. He reports that he missed the dose of warfarin on 10/10. Taken together, these factors explain the subtherapeutic INR he presented with. His diet has been poor over the last 2 days. Suspect resumption of his home regimen will be enough to raise his INR back to therapeutic range over the next 2-3 days.   10/12: INR therapeutic at 1.8. CBC wnl. Will continue home regimen  Goal of Therapy:  INR 2-3 Monitor platelets by anticoagulation protocol: Yes   Plan:  Continue home warfarin regimen - 2.5 mg on Sunday and Thursday, 5 mg all other days.  Daily INR, CBC.   Jannet Calip 07/04/2023,8:14 AM

## 2023-07-04 NOTE — Evaluation (Signed)
Occupational Therapy Evaluation Patient Details Name: Jonathan Romero MRN: [id number] DOB: [birth year] Today's Date: 07/04/2023   History of Present Illness Pt is 77 yo male presenting to [facility] ER for evaluation of elevated blood pressure. Pt states he started to feel a change in sensation in the L side of his face. He took his BP multiple times and it was 200's over 140's. MRI positive for small inear acute infarct in the R parietal lobe per MD note. PMH: A fib s/p DCCV, severe aortic stenosis s/p TAVR, MI s/p DES x 3, HLD.   Clinical Impression   Pt ind at baseline with ADLs/functional mobility lives with spouse who he assists PRN. Pt currently close to baseline, performing ADLs without assist, ind with bed mobility and hallway distance ambulation. Some STM deficits noted with dual tasking, educated pt/family on having spouse double check/assist with med mgmt an pt verbalized understanding. Pt presenting with impairments listed below, however has no acute OT needs at this time, will s/o. Please reconsult if there is a change in pt status. Anticipate no OT follow up needs at d/c.        If plan is discharge home, recommend the following: Direct supervision/assist for medications management;Direct supervision/assist for financial management;Assist for transportation;Assistance with cooking/housework    Functional Status Assessment  Patient has had a recent decline in their functional status and demonstrates the ability to make significant improvements in function in a reasonable and predictable amount of time.  Equipment Recommendations  None recommended by OT    Recommendations for Other Services       Precautions / Restrictions Precautions Precautions: None Restrictions Weight Bearing Restrictions: No      Mobility Bed Mobility Overal bed mobility: Independent                  Transfers Overall transfer level: Independent                        Balance Overall  balance assessment: No apparent balance deficits (not formally assessed)                                         ADL either performed or assessed with clinical judgement   ADL Overall ADL's : At baseline                                             Vision Baseline Vision/History: 1 Wears glasses Vision Assessment?: No apparent visual deficits Additional Comments: can read clock on wall     Perception Perception: Not tested       Praxis Praxis: Not tested       Pertinent Vitals/Pain Pain Assessment Pain Assessment: No/denies pain     Extremity/Trunk Assessment Upper Extremity Assessment Upper Extremity Assessment: Generalized weakness   Lower Extremity Assessment Lower Extremity Assessment: Defer to PT evaluation   Cervical / Trunk Assessment Cervical / Trunk Assessment: Normal   Communication Communication Communication: No apparent difficulties   Cognition Arousal: Alert Behavior During Therapy: WFL for tasks assessed/performed Overall Cognitive Status: Within Functional Limits for tasks assessed  General Comments: mild memory and problem solving impairments     General Comments  VSS    Exercises     Shoulder Instructions      Home Living Family/patient expects to be discharged to:: Private residence Living Arrangements: Spouse/significant other Available Help at Discharge: Family;Available PRN/intermittently Type of Home: House Home Access: Stairs to enter Entrance Stairs-Number of Steps: 3 Entrance Stairs-Rails: Right Home Layout: Able to live on main level with bedroom/bathroom;Two level Alternate Level Stairs-Number of Steps: 14/15 Alternate Level Stairs-Rails: Right;Left;Can reach both Bathroom Shower/Tub: Tub/shower unit   Bathroom Toilet: Handicapped height Bathroom Accessibility: Yes   Home Equipment: Grab bars - toilet;Grab bars - tub/shower;Rolling Walker  (2 wheels);Shower seat;Other (comment)   Additional Comments: lives at home with wife who has complex medical history      Prior Functioning/Environment Prior Level of Function : Independent/Modified Independent;Driving             Mobility Comments: ambulated without device in both home and in community ADLs Comments: independent for self care and IADLs, assists spouse as needed with her self care        OT Problem List: Decreased cognition;Decreased safety awareness      OT Treatment/Interventions:      OT Goals(Current goals can be found in the care plan section) Acute Rehab OT Goals Patient Stated Goal: none stated OT Goal Formulation: With patient Time For Goal Achievement: 07/18/23 Potential to Achieve Goals: Good  OT Frequency:      Co-evaluation              AM-PAC OT "6 Clicks" Daily Activity     Outcome Measure Help from another person eating meals?: None Help from another person taking care of personal grooming?: A Little Help from another person toileting, which includes using toliet, bedpan, or urinal?: A Little Help from another person bathing (including washing, rinsing, drying)?: A Little Help from another person to put on and taking off regular upper body clothing?: A Little Help from another person to put on and taking off regular lower body clothing?: A Little 6 Click Score: 19   End of Session Nurse Communication: Mobility status  Activity Tolerance: Patient tolerated treatment well Patient left: in bed;with call bell/phone within reach;with family/visitor present  OT Visit Diagnosis: Unsteadiness on feet (R26.81);Other abnormalities of gait and mobility (R26.89);Muscle weakness (generalized) (M62.81)                Time: 1610-9604 OT Time Calculation (min): 15 min Charges:  OT General Charges $OT Visit: 1 Visit OT Evaluation $OT Eval Low Complexity: 1 Low  Jonathan Romero, OTD, OTR/L SecureChat Preferred Acute Rehab [phone]   Jonathan Romero 07/04/2023, 12:32 PM

## 2023-07-04 NOTE — Plan of Care (Signed)

## 2023-07-04 NOTE — Discharge Summary (Signed)
Name: Jonathan Romero MRN: [id number] DOB: 12/13/1945 77 y.o. PCP: Assunta Found, MD  Date of Admission: 07/02/2023 10:54 PM Date of Discharge:  07/04/23 Attending Physician: Dr. Antony Contras  DISCHARGE DIAGNOSIS:  Primary Problem: Acute CVA (cerebrovascular accident) [facility])   Hospital Problems: Principal Problem:   Acute CVA (cerebrovascular accident) [facility]) Active Problems:   Hyperlipidemia   Atrial fibrillation (HCC)   S/P TAVR (transcatheter aortic valve replacement)   CAD (coronary artery disease)    DISCHARGE MEDICATIONS:   Allergies as of 07/04/2023       Reactions   Doxycycline Other (See Comments)   Joint pain   Statins    Joint pain        Medication List     STOP taking these medications    cephALEXin 500 MG capsule Commonly known as: KEFLEX   warfarin 5 MG tablet Commonly known as: COUMADIN       TAKE these medications    apixaban 5 MG Tabs tablet Commonly known as: ELIQUIS Take 1 tablet (5 mg total) by mouth 2 (two) times daily.   ciprofloxacin-dexamethasone OTIC suspension Commonly known as: Ciprodex Place 4 drops into the left ear 2 (two) times daily.   clopidogrel 75 MG tablet Commonly known as: PLAVIX Take 1 tablet (75 mg total) by mouth daily.   ezetimibe 10 MG tablet Commonly known as: ZETIA Take 1 tablet (10 mg total) by mouth daily.   furosemide 20 MG tablet Commonly known as: LASIX Take 1 tablet (20 mg total) by mouth daily as needed for fluid or edema (if bottom bp number is above 80).   hydrocortisone 25 MG suppository Commonly known as: ANUSOL-HC Unwrap and place 1 suppository (25 mg total) rectally 2 (two) times daily.   hydrocortisone cream 1 % Apply 1 Application topically daily as needed (hemorrhoids).   metoprolol succinate 25 MG 24 hr tablet Commonly known as: TOPROL-XL Take 1 tablet (25 mg total) by mouth daily. What changed:  how much to take how to take this   nitroGLYCERIN 0.4 MG SL tablet Commonly  known as: NITROSTAT Place 1 tablet (0.4 mg total) under the tongue every 5 (five) minutes x 3 doses as needed for chest pain.   polyethylene glycol powder 17 GM/SCOOP powder Commonly known as: GLYCOLAX/MIRALAX Take 17 g by mouth daily.        DISPOSITION AND FOLLOW-UP:  Mr.Jonathan Romero was discharged from [facility] in Stable condition. At the hospital follow up visit please address:  Follow-up Recommendations: Consults: Cardiology, Neurology Labs:  CBC Studies: None Medications: Eliquis, Leqvio (pending approval)   Follow-up Appointments:  Follow-up Information     Assunta Found, MD. Schedule an appointment as soon as possible for a visit.   Specialty: Family Medicine Contact information: [address] [place] [address] [phone]         [place], Peter M, MD Follow up on 08/10/2023.   Specialty: Cardiology Contact information: [address] [place] [address] [place] [address] [phone]         Neurology. Schedule an appointment as soon as possible for a visit in 8 week(s).   Why: Referral placed at discharge for you to follow-up in 8 weeks                HOSPITAL COURSE:  Patient Summary: #Acute right parietal lobe CVA Suspect in setting of sub-therapeutic anticoagulation. Deficit was altered sensation along dermal area of CN V3, otherwise no other gross neurological abnormalities. He was also  quite hypertensive at home when symptoms occurred with SBP >200. Neurology consulted. CTA demonstrated no large vessel occlusion, hemodynamically significant stenosis, or aneurysm in the head or neck.  EKG and telemetry demonstrated NSR.  CT head demonstrated no acute intracranial abnormality. MRI brain demonstrated small linear acute infarct in the right parietal lobe with no acute hemorrhage.  Neurology did not recommend a TTE from a stroke standpoint as he recently had a TTE on 03/05/2023 with normal EF.  His INR was 1.4. For his  anticoagulation, he follows with Dr. [place] and has been very compliant with his medications.  There is an unclear reason for his subtherapeutic levels so he is transitioning to a DOAC.  He was given a 30-day supply of Eliquis while pharmacy sent a prior authorization for this approval.  He was stable at discharge and will follow-up outpatient for his prior authorization of the Eliquis for continuation.  #Hx MI s/p DES x3 #HLD #Aortic stenosis s/p TAVR #Chest tightness, resolved No palpitations recently but did have chest "tightness" when he first arrived though this has resolved and troponin levels were flat, 10>10. No acute concern for ACS. Lipid panel showed elevated LDL of 170. Patient currently taking Zetia, Plavix, and warfarin. Warfarin subtherapeutic and attempting transition to Eliquis pending prior authorization. Neurology recommended adding Leqvio which will require authorization. Patient signed form and will follow-up outpatient pending this authorization.   #Atrial fibrillation s/p DCCV Regular rhythm with bradycardia on my exam. OP regimen includes warfarin for anticoagulation, metoprolol 25 mg daily which he did take yesterday. HR based on chart review is consistently in low 50's. Home metoprolol 25 mg daily continued.   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 No signs of volume overload on my exam. Neurology did not recommend a repeat TTE. No concerns at discharge.   #Recent otitis media of L ear No complaints regarding his ear today. Did not take second dose of keflex yesterday which would have been the last pill of his 7-day treatment. Has been giving himself ciprodex ear drops as prescribed. Received his last dose of Keflex 500 mg to complete therapy.   #Hx hemorrhoids Was admitted in August for rectal bleeding  found to be 2/2 hemorrhoids. He has hydrocortisone suppositories which he uses as needed but has not had trouble related to hemorrhoids recently and is not regularly using the suppositories. No concerns at discharge.        DISCHARGE INSTRUCTIONS:   Discharge Instructions     Ambulatory referral to Neurology   Complete by: As directed    An appointment is requested in approximately: 8 weeks   Call MD for:  difficulty breathing, headache or visual disturbances   Complete by: As directed    Call MD for:  extreme fatigue   Complete by: As directed    Call MD for:  hives   Complete by: As directed    Call MD for:  persistant dizziness or light-headedness   Complete by: As directed    Call MD for:  persistant nausea and vomiting   Complete by: As directed    Call MD for:  redness, tenderness, or signs of infection (pain, swelling, redness, odor or green/yellow discharge around incision site)   Complete by: As directed    Call MD for:  severe uncontrolled pain   Complete by: As directed  Call MD for:  temperature >100.4   Complete by: As directed    Diet - low sodium heart healthy   Complete by: As directed    Discharge instructions   Complete by: As directed    Mr. Middlesworth,  You were hospitalized with concerns for a stroke.  We have further evaluated the stroke and believe that you are safe to discharge home.  Your cholesterol was elevated, particularly your LDL.  We have worked alongside neurology, and they recommend a medication called Leqvio that will help lower your cholesterol.  You were given a form to fill out, and this will go through an authorization process to see if you are approved.  We will also be changing her medication that helps prevent blood clots.  You were previously on warfarin, and we will work the pharmacy for possible prior authorization for a medication called Eliquis.  We have given you a supply for 30 days in the meantime.  Please *STOP* taking the  warfarin.  Please *START* taking the Eliquis 5 mg twice per day.   Please continue taking your other home medications as prescribed.   Please also make sure to follow-up with your primary care provider Phillips Odor, Jonny Ruiz, MD) for a hospital follow-up. We are glad that you are feeling better!   Increase activity slowly   Complete by: As directed        SUBJECTIVE:  Patient was evaluated bedside.  Denies any headache, nausea, vomiting, chest pain, shortness of breath, or any other signs or symptoms.  Degree sensation face also improving.  No concerns at this time. Discharge Vitals:   BP (!) 120/93 (BP Location: Right Arm)   Pulse (!) 57   Temp 98.3 F (36.8 C) (Oral)   Resp 17   Ht 5\' 10"  (1.778 m)   SpO2 98%   BMI 20.40 kg/m   OBJECTIVE:  Physical Exam Constitutional:      Appearance: Normal appearance.  HENT:     Head: Normocephalic and atraumatic.  Cardiovascular:     Rate and Rhythm: Normal rate and regular rhythm.  Pulmonary:     Effort: Pulmonary effort is normal.     Breath sounds: Normal breath sounds.  Abdominal:     General: Abdomen is flat. Bowel sounds are normal.     Palpations: Abdomen is soft.  Skin:    General: Skin is warm.  Neurological:     General: No focal deficit present.     Mental Status: He is alert and oriented to person, place, and time.     Pertinent Labs, Studies, and Procedures:     Latest Ref Rng & Units 07/04/2023    4:22 AM 07/02/2023   11:32 PM 05/15/2023    3:20 AM  CBC  WBC 4.0 - 10.5 K/uL 6.8  6.9  8.6   Hemoglobin 13.0 - 17.0 g/dL 16.1  09.6  04.5   Hematocrit 39.0 - 52.0 % 42.3  40.5  40.5   Platelets 150 - 400 K/uL 202  210  186        Latest Ref Rng & Units 07/04/2023    4:22 AM 07/02/2023   11:32 PM 05/14/2023   11:46 AM  CMP  Glucose 70 - 99 mg/dL 92  97  89   BUN 8 - 23 mg/dL 19  11  10    Creatinine 0.61 - 1.24 mg/dL 4.09  8.11  9.14   Sodium 135 - 145 mmol/L 135  136  137   Potassium  3.5 - 5.1 mmol/L 4.0  4.2   4.1   Chloride 98 - 111 mmol/L 101  99  105   CO2 22 - 32 mmol/L 25  25  25    Calcium 8.9 - 10.3 mg/dL 8.9  9.2  8.6     CT ANGIO HEAD NECK W WO CM  Result Date: 07/03/2023 CLINICAL DATA:  Neuro deficit, acute, stroke suspected. EXAM: CT ANGIOGRAPHY HEAD AND NECK WITH AND WITHOUT CONTRAST TECHNIQUE: Multidetector CT imaging of the head and neck was performed using the standard protocol during bolus administration of intravenous contrast. Multiplanar CT image reconstructions and MIPs were obtained to evaluate the vascular anatomy. Carotid stenosis measurements (when applicable) are obtained utilizing NASCET criteria, using the distal internal carotid diameter as the denominator. RADIATION DOSE REDUCTION: This exam was performed according to the departmental dose-optimization program which includes automated exposure control, adjustment of the mA and/or kV according to patient size and/or use of iterative reconstruction technique. CONTRAST:  75mL OMNIPAQUE IOHEXOL 350 MG/ML SOLN COMPARISON:  MRI brain 07/03/2023. FINDINGS: CTA NECK FINDINGS Aortic arch: Incompletely imaged. Atherosclerotic vascular changes along the proximal left subclavian artery without significant stenosis. Right carotid system: No evidence of dissection, stenosis (50% or greater), or occlusion. Mild calcified plaque along the right carotid bulb and proximal right cervical ICA. Left carotid system: No evidence of dissection, stenosis (50% or greater), or occlusion. Mild calcified plaque along the left carotid bulb and proximal left cervical ICA. Vertebral arteries: Codominant. No evidence of dissection, stenosis (50% or greater), or occlusion. Mild calcified plaque along the bilateral vertebral artery origins. Skeleton: Acquired fusion from C2-C6.  No suspicious bone lesions. Other neck: Bilateral microphthalmia. Upper chest: Unremarkable. Review of the MIP images confirms the above findings CTA HEAD FINDINGS Anterior circulation:  Calcified plaque along the carotid siphons without hemodynamically significant stenosis. The proximal ACAs and MCAs are patent without stenosis or aneurysm. Distal branches are symmetric. Posterior circulation: Normal basilar artery. The SCAs, AICAs and PICAs are patent proximally. The PCAs are patent proximally without stenosis or aneurysm. Distal branches are symmetric. Venous sinuses: As permitted by contrast timing, patent. Anatomic variants: None. Review of the MIP images confirms the above findings IMPRESSION: 1. No large vessel occlusion, hemodynamically significant stenosis, or aneurysm in the head or neck. 2. Bilateral microphthalmia. Aortic Atherosclerosis (ICD10-I70.0). Electronically Signed   By: Orvan Falconer M.D.   On: 07/03/2023 18:14   MR BRAIN WO CONTRAST  Result Date: 07/03/2023 CLINICAL DATA:  Neuro deficit, acute, stroke suspected EXAM: MRI HEAD WITHOUT CONTRAST TECHNIQUE: Multiplanar, multiecho pulse sequences of the brain and surrounding structures were obtained without intravenous contrast. COMPARISON:  Same day CT head, brain MR 11/25/2018 FINDINGS: Brain: Small linear acute infarct in the right parietal lobe (series 2, image 35). No acute hemorrhage. No hydrocephalus. No extra-axial fluid collection. Background of mild chronic microvascular ischemic change. There is chronic infarct in the right cerebellum. Generalized volume loss without lobar predominance. Vascular: Normal flow voids. Skull and upper cervical spine: Normal marrow signal. Sinuses/Orbits: Trace bilateral mastoid effusions. Paranasal sinuses are clear. Bilateral lens replacement. Orbits are otherwise unremarkable. Other: None. IMPRESSION: Small linear acute infarct in the right parietal lobe. No acute hemorrhage. Electronically Signed   By: Lorenza Cambridge M.D.   On: 07/03/2023 08:24   CT HEAD WO CONTRAST ( )  Result Date: 07/03/2023 CLINICAL DATA:  77 year old male with hypertension and neurologic deficit. Jaw  pain. EXAM: CT HEAD WITHOUT CONTRAST TECHNIQUE: Contiguous axial images were obtained from the base  of the skull through the vertex without intravenous contrast. RADIATION DOSE REDUCTION: This exam was performed according to the departmental dose-optimization program which includes automated exposure control, adjustment of the mA and/or kV according to patient size and/or use of iterative reconstruction technique. COMPARISON:  Brain MRI 11/25/2018.  Head CT 12/08/2018. FINDINGS: Brain: Mild cerebral volume loss since 2020 appears to be generalized. No midline shift, ventriculomegaly, mass effect, evidence of mass lesion, intracranial hemorrhage or evidence of cortically based acute infarction. Gray-white differentiation stable and within normal limits. Vascular: No suspicious intracranial vascular hyperdensity. Skull: No acute osseous abnormality identified. Sinuses/Orbits: Visualized paranasal sinuses and mastoids are stable and well aerated. Other: Bilateral phthisis bulbi. Visualized scalp soft tissues are within normal limits. IMPRESSION: 1. No acute intracranial abnormality. Negative for age noncontrast CT appearance of the brain. 2. Chronic bilateral phthisis bulbi. Electronically Signed   By: Odessa Fleming M.D.   On: 07/03/2023 06:40   DG Chest 2 View  Result Date: 07/02/2023 CLINICAL DATA:  Jaw pain. EXAM: CHEST - 2 VIEW COMPARISON:  Radiograph 08/18/2022.  CT 08/12/2022 FINDINGS: The heart is normal in size. Coronary stent visualized. TAVR. Aortic atherosclerosis. Chronic peripheral reticular changes in the right upper lung zone. No pulmonary edema, focal airspace disease, significant pleural effusion or pneumothorax. Mild broad-based scoliotic curvature of the spine. IMPRESSION: No acute chest findings. Electronically Signed   By: Narda Rutherford M.D.   On: 07/02/2023 23:53    Signed: Morrie Sheldon, MD Internal Medicine Resident, PGY-1 [facility]  Pager:  [phone] 1:03 PM, 07/04/2023

## 2023-07-07 ENCOUNTER — Ambulatory Visit: Payer: Medicare Other | Attending: Physician Assistant | Admitting: *Deleted

## 2023-07-07 DIAGNOSIS — Z952 Presence of prosthetic heart valve: Secondary | ICD-10-CM

## 2023-07-07 DIAGNOSIS — Z5181 Encounter for therapeutic drug level monitoring: Secondary | ICD-10-CM | POA: Diagnosis not present

## 2023-07-07 DIAGNOSIS — I4891 Unspecified atrial fibrillation: Secondary | ICD-10-CM

## 2023-07-07 MED ORDER — APIXABAN 5 MG PO TABS: 5.0000 mg | ORAL_TABLET | Freq: Two times a day (BID) | ORAL | 1 refills | Status: DC

## 2023-07-07 NOTE — Patient Instructions (Signed)
Pt was changed from warfarin to Eliquis in the hospital after recent CVA. Prior Authorization papers were filled out in the hospital by Pharmacy.  Hasn't heard back yet.  Got 30 day supply of Eliquis in hospital.  New Rx sent to [facility] today.  Eliquis teaching done.

## 2023-07-09 ENCOUNTER — Other Ambulatory Visit: Payer: Self-pay

## 2023-07-09 ENCOUNTER — Telehealth: Payer: Self-pay

## 2023-07-09 NOTE — Telephone Encounter (Signed)
Dr. Roda Shutters, the patient will be scheduled as soon as possible.  Auth Submission: NO AUTH NEEDED Site of care: Site of care: [facility] Payer: Medicare A/B plus [employer] supplement Medication & CPT/J Code(s) submitted: Leqvio (Inclisiran) [phone] Route of submission (phone, fax, portal):  Phone # Fax # Auth type: Buy/Bill PB Units/visits requested: 284mg  x 2 doses Reference number:  Approval from: 07/09/23 to 10/23/23   Medicare A/B will cover 80%, [employer] supplement will cover 20%.

## 2023-08-05 ENCOUNTER — Ambulatory Visit: Payer: Medicare Other | Admitting: Adult Health

## 2023-08-05 ENCOUNTER — Encounter: Payer: Self-pay | Admitting: Adult Health

## 2023-08-05 VITALS — BP 159/72 | HR 72 | Ht 70.0 in | Wt 147.0 lb

## 2023-08-05 DIAGNOSIS — I63411 Cerebral infarction due to embolism of right middle cerebral artery: Secondary | ICD-10-CM

## 2023-08-05 NOTE — Progress Notes (Signed)
I agree with the above plan 

## 2023-08-05 NOTE — Patient Instructions (Addendum)
Continue  Eliquis 5 mg twice daily   and Zetia for secondary stroke prevention  Continue to follow with cardiology as scheduled  Continue to follow up with PCP/cardiology regarding blood pressure and cholesterol management  Maintain strict control of hypertension with blood pressure goal below 130/90 and cholesterol with LDL cholesterol (bad cholesterol) goal below 70 mg/dL.   Signs of a Stroke? Follow the BEFAST method:  Balance Watch for a sudden loss of balance, trouble with coordination or vertigo Eyes Is there a sudden loss of vision in one or both eyes? Or double vision?  Face: Ask the person to smile. Does one side of the face droop or is it numb?  Arms: Ask the person to raise both arms. Does one arm drift downward? Is there weakness or numbness of a leg? Speech: Ask the person to repeat a simple phrase. Does the speech sound slurred/strange? Is the person confused ? Time: If you observe any of these signs, call 911.       Thank you for coming to see [name] at [facility]. I hope we have been able to provide you high quality care today.  You may receive a patient satisfaction survey over the next few weeks. We would appreciate your feedback and comments so that we may continue to improve ourselves and the health of our patients.    Stroke Prevention Some medical conditions and lifestyle choices can lead to a higher risk for a stroke. You can help to prevent a stroke by eating healthy foods and exercising. It also helps to not smoke and to manage any health problems you may have. How can this condition affect me? A stroke is an emergency. It should be treated right away. A stroke can lead to brain damage or threaten your life. There is a better chance of surviving and getting better after a stroke if you get medical help right away. What can increase my risk? The following medical conditions may increase your risk of a stroke: Diseases of the heart and blood  vessels (cardiovascular disease). High blood pressure (hypertension). Diabetes. High cholesterol. Sickle cell disease. Problems with blood clotting. Being very overweight. Sleeping problems (obstructivesleep apnea). Other risk factors include: Being older than age 52. A history of blood clots, stroke, or mini-stroke (TIA). Race, ethnic background, or a family history of stroke. Smoking or using tobacco products. Taking birth control pills, especially if you smoke. Heavy alcohol and drug use. Not being active. What actions can I take to prevent this? Manage your health conditions High cholesterol. Eat a healthy diet. If this is not enough to manage your cholesterol, you may need to take medicines. Take medicines as told by your doctor. High blood pressure. Try to keep your blood pressure below 130/80. If your blood pressure cannot be managed through a healthy diet and regular exercise, you may need to take medicines. Take medicines as told by your doctor. Ask your doctor if you should check your blood pressure at home. Have your blood pressure checked every year. Diabetes. Eat a healthy diet and get regular exercise. If your blood sugar (glucose) cannot be managed through diet and exercise, you may need to take medicines. Take medicines as told by your doctor. Talk to your doctor about getting checked for sleeping problems. Signs of a problem can include: Snoring a lot. Feeling very tired. Make sure that you manage any other conditions you have. Nutrition  Follow instructions from your doctor about what to eat or drink. You  may be told to: Eat and drink fewer calories each day. Limit how much salt (sodium) you use to 1,500 milligrams (mg) each day. Use only healthy fats for cooking, such as olive oil, canola oil, and sunflower oil. Eat healthy foods. To do this: Choose foods that are high in fiber. These include whole grains, and fresh fruits and vegetables. Eat at least 5  servings of fruits and vegetables a day. Try to fill one-half of your plate with fruits and vegetables at each meal. Choose low-fat (lean) proteins. These include low-fat cuts of meat, chicken without skin, fish, tofu, beans, and nuts. Eat low-fat dairy products. Avoid foods that: Are high in salt. Have saturated fat. Have trans fat. Have cholesterol. Are processed or pre-made. Count how many carbohydrates you eat and drink each day. Lifestyle If you drink alcohol: Limit how much you have to: 0-1 drink a day for women who are not pregnant. 0-2 drinks a day for men. Know how much alcohol is in your drink. In the [place] one drink equals one 12 oz bottle of beer ( ), one 5 oz glass of wine ( ), or one 1 oz glass of hard liquor (44mL). Do not smoke or use any products that have nicotine or tobacco. If you need help quitting, ask your doctor. Avoid secondhand smoke. Do not use drugs. Activity  Try to stay at a healthy weight. Get at least 30 minutes of exercise on most days, such as: Fast walking. Biking. Swimming. Medicines Take over-the-counter and prescription medicines only as told by your doctor. Avoid taking birth control pills. Talk to your doctor about the risks of taking birth control pills if: You are over 45 years old. You smoke. You get very bad headaches. You have had a blood clot. Where to find more information American Stroke Association: www.strokeassociation.org Get help right away if: You or a loved one has any signs of a stroke. "BE FAST" is an easy way to remember the warning signs: B - Balance. Dizziness, sudden trouble walking, or loss of balance. E - Eyes. Trouble seeing or a change in how you see. F - Face. Sudden weakness or loss of feeling of the face. The face or eyelid may droop on one side. A - Arms. Weakness or loss of feeling in an arm. This happens all of a sudden and most often on one side of the body. S - Speech. Sudden trouble speaking,  slurred speech, or trouble understanding what people say. T - Time. Time to call emergency services. Write down what time symptoms started. You or a loved one has other signs of a stroke, such as: A sudden, very bad headache with no known cause. Feeling like you may vomit (nausea). Vomiting. A seizure. These symptoms may be an emergency. Get help right away. Call your local emergency services (911 in the [place] Do not wait to see if the symptoms will go away. Do not drive yourself to the hospital. Summary You can help to prevent a stroke by eating healthy, exercising, and not smoking. It also helps to manage any health problems you have. Do not smoke or use any products that contain nicotine or tobacco. Get help right away if you or a loved one has any signs of a stroke. This information is not intended to replace advice given to you by your health care provider. Make sure you discuss any questions you have with your health care provider. Document Revised: 08/11/2022 Document Reviewed: 08/11/2022 Elsevier Patient Education  2024 Elsevier  Inc.

## 2023-08-05 NOTE — Progress Notes (Signed)
[facility] [address] [place]. [place] [address] [phone]       HOSPITAL FOLLOW UP NOTE  Mr. OATIS KINKADE Date of Birth:  [birth year] Medical Record Number:  [id number]   Reason for Referral:  hospital stroke follow up    SUBJECTIVE:   CHIEF COMPLAINT:  Chief Complaint  Patient presents with   Follow-up    Patient in room #8 with his son. Patient states he is well and stable, with no new concerns.     HPI:   Mr. QUANDELL ABELLERA is a 77 y.o. male A-fib on warfarin, CAD on Plavix, hyperlipidemia, GERD, MI, severe aortic stenosis status post TAVR who presented to ED on 07/02/2023 with sudden onset left facial numbness and elevated blood pressure. Unclear exactly when the numbness started, late morning versus later in the day.  Stroke workup revealed right punctate MCA/ACA infarct, etiology cardioembolic from A-fib on warfarin with subtherapeutic INR at 1.4.  Recommend transition to Eliquis as he continued to have INR fluctuation at home.  LDL 170 on Zetia with prior history of intolerance to multiple statins, recommended consideration of Leqvio, completed insurance authorization form and patient.  No therapy needs and discharged home.   Today, 08/05/2023, patient is being seen for hospital follow-up accompanied by his son.  Reports doing well since discharge, denies any residual deficits or new stroke/TIA symptoms.  Has returned back to all prior activities.  Lives with his wife, maintains ADLs independently, has close supportive family who visit daily and assist as needed.  Remains on Eliquis 5 mg twice daily, can have occasional bleeding with hemorrhoids but not significant (present with use of warfarin as well), no other bleeding concerns.  Leqvio approved by insurance but he declines use due to prior intolerance to cholesterol medications, remains on Zetia.  Blood pressure today elevated, monitors at home and typically 120-130s/70-80s, was 132/57 this morning.  Has follow-up with cardiology Dr. [place] next week. Has since had follow-up with PCP Dr. Phillips Odor.     PERTINENT IMAGING Code Stroke CT head No acute abnormality.  CTA head & neck: No LVO, significant stenosis or aneurysm.  Bilateral ICA bulb atherosclerosis MRI: Small linear acute infarct in the right parietal lobe. No acute hemorrhage.  2D Echo 03/05/23: EF 60-65%, Mild to Moderately Dilated Left Atria LDL 170 HgbA1c 5.1     ROS:   14 system review of systems performed and negative with exception of those listed in HPI  PMH:  Past Medical History:  Diagnosis Date   Arthritis    Cellulitis    Coronary artery disease    s/p 3 overlapping DES from proximal-mid LAD 08/13/22   Dyslipidemia    GERD (gastroesophageal reflux disease)    Hyperlipidemia 09/23/2011   LDL particle number [id number] with LDL CALULATED at 151 and 2300 in 1988 was the small LDL particle number,which is very high- not tinterested in taking medications   Myocardial infarction (HCC)    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 Excell Seltzer & Weldner   Severe aortic stenosis     PSH:  Past Surgical History:  Procedure Laterality Date   CARDIOVERSION N/A 10/09/2022   Procedure: CARDIOVERSION;  Surgeon: Pricilla Riffle, MD;  Location: [facility] ENDOSCOPY;  Service: Cardiovascular;  Laterality: N/A;   CATARACT EXTRACTION     CORONARY ATHERECTOMY N/A 08/13/2022   Procedure: CORONARY ATHERECTOMY;  Surgeon: Tonny Bollman, MD;  Location: [facility] INVASIVE CV LAB;  Service: Cardiovascular;  Laterality: N/A;   CORONARY STENT INTERVENTION N/A 08/13/2022   Procedure: CORONARY STENT INTERVENTION;  Surgeon: Tonny Bollman, MD;  Location: [facility] INVASIVE CV LAB;  Service: Cardiovascular;  Laterality: N/A;   DOPPLER ECHOCARDIOGRAPHY  12/21/2012   EF 55-60%,   DOPPLER ECHOCARDIOGRAPHY  12/16/2011   MILD AORTIC STENOSIS,PEAK AND MEAN GRADIENTS OF 18 AND 8 mmHg and a  valve area of around 2 square cm.    INTRAOPERATIVE TRANSTHORACIC ECHOCARDIOGRAM N/A 08/19/2022   Procedure: INTRAOPERATIVE TRANSTHORACIC ECHOCARDIOGRAM;  Surgeon: Tonny Bollman, MD;  Location: [facility] OR;  Service: Open Heart Surgery;  Laterality: N/A;   RIGHT/LEFT HEART CATH AND CORONARY ANGIOGRAPHY N/A 08/11/2022   Procedure: RIGHT/LEFT HEART CATH AND CORONARY ANGIOGRAPHY;  Surgeon: Lyn Records, MD;  Location: [facility] INVASIVE CV LAB;  Service: Cardiovascular;  Laterality: N/A;   TEE WITHOUT CARDIOVERSION N/A 10/09/2022   Procedure: TRANSESOPHAGEAL ECHOCARDIOGRAM (TEE);  Surgeon: Pricilla Riffle, MD;  Location: [facility] ENDOSCOPY;  Service: Cardiovascular;  Laterality: N/A;   TRANSCATHETER AORTIC VALVE REPLACEMENT, TRANSFEMORAL N/A 08/19/2022   Procedure: Transcatheter Aortic Valve Replacement, Transfemoral using a 26 MM Edwards SAPIEN 3 Ultra;  Surgeon: Tonny Bollman, MD;  Location: [facility] OR;  Service: Open Heart Surgery;  Laterality: N/A;  Transfemoral    Social History:  Social History   Socioeconomic History   Marital status: Married    Spouse name: Henrita   Number of children: 3   Years of education: Not on file   Highest education level: High school graduate  Occupational History   Occupation: retired  Tobacco Use   Smoking status: Former    Current packs/day: 0.00    Types: Cigarettes    Quit date: 04/15/1983    Years since quitting: 40.3   Smokeless tobacco: Never   Tobacco comments:    Former smoker 10/16/22  Vaping Use   Vaping status: Never Used  Substance and Sexual Activity   Alcohol use: Yes    Comment: beer occasionally 10/16/22   Drug use: No   Sexual activity: Not on file  Other Topics Concern   Not on file  Social History Narrative   Not on file   Social Determinants of Health   Financial Resource Strain: Low Risk  (08/13/2022)   Overall Financial Resource Strain (CARDIA)    Difficulty of Paying Living Expenses: Not hard at all  Food Insecurity: No Food  Insecurity (07/03/2023)   Hunger Vital Sign    Worried About Running Out of Food in the Last Year: Never true    Ran Out of Food in the Last Year: Never true  Transportation Needs: No Transportation Needs (07/03/2023)   PRAPARE - Administrator, Civil Service (Medical): No    Lack of Transportation (Non-Medical): No  Physical Activity: Not on file  Stress: Not on file  Social Connections: Not on file  Intimate Partner Violence: Not At Risk (07/03/2023)   Humiliation, Afraid, Rape, and Kick questionnaire    Fear of Current or Ex-Partner: No    Emotionally Abused: No    Physically Abused: No    Sexually Abused: No    Family History:  Family History  Problem Relation Age of Onset   Diabetes Mother    Heart failure Father     Medications:   Current Outpatient Medications on File Prior to Visit  Medication Sig Dispense Refill   apixaban (ELIQUIS) 5 MG TABS tablet Take 1 tablet (5 mg total) by mouth 2 (  two) times daily. 180 tablet 1   ciprofloxacin-dexamethasone (CIPRODEX) OTIC suspension Place 4 drops into the left ear 2 (two) times daily. 7.5 mL 0   clopidogrel (PLAVIX) 75 MG tablet Take 1 tablet (75 mg total) by mouth daily. 90 tablet 3   ezetimibe (ZETIA) 10 MG tablet Take 1 tablet (10 mg total) by mouth daily. 90 tablet 3   furosemide (LASIX) 20 MG tablet Take 1 tablet (20 mg total) by mouth daily as needed for fluid or edema (if bottom bp number is above 80).     hydrocortisone (ANUSOL-HC) 25 MG suppository Unwrap and place 1 suppository (25 mg total) rectally 2 (two) times daily. 20 suppository 0   hydrocortisone cream 1 % Apply 1 Application topically daily as needed (hemorrhoids).     metoprolol succinate (TOPROL-XL) 25 MG 24 hr tablet Take 1 tablet (25 mg total) by mouth daily. (Patient taking differently: 2 tablets daily.) 30 tablet 0   nitroGLYCERIN (NITROSTAT) 0.4 MG SL tablet Place 1 tablet (0.4 mg total) under the tongue every 5 (five) minutes x 3 doses as  needed for chest pain. 25 tablet 2   polyethylene glycol powder (GLYCOLAX/MIRALAX) 17 GM/SCOOP powder Take 17 g by mouth daily. 238 g 0   No current facility-administered medications on file prior to visit.    Allergies:   Allergies  Allergen Reactions   Doxycycline Other (See Comments)    Joint pain   Statins     Joint pain      OBJECTIVE:  Physical Exam  Vitals:   08/05/23 0947  BP: (!) 159/72  Pulse: 72  Weight: 147 lb (66.7 kg)  Height: 5\' 10"  (1.778 m)   Body mass index is 21.09 kg/m. No results found.  General: Frail very pleasant elderly Caucasian male, seated, in no evident distress Head: head normocephalic and atraumatic.   Neck: supple with no carotid or supraclavicular bruits Cardiovascular: regular rate and rhythm, no murmurs  Neurologic Exam Mental Status: Awake and fully alert.  Fluent speech and language.  Oriented to place and time. Recent and remote memory intact. Attention span, concentration and fund of knowledge appropriate. Mood and affect appropriate.  Cranial Nerves: Fundoscopic exam reveals sharp disc margins. Pupils equal, briskly reactive to light. Extraocular movements full without nystagmus. Visual fields full to confrontation. Hearing intact. Facial sensation intact. Face, tongue, palate moves normally and symmetrically.  Motor: Normal bulk and tone. Normal strength in all tested extremity muscles Sensory.: intact to touch , pinprick , position and vibratory sensation.  Coordination: Rapid alternating movements normal in all extremities. Finger-to-nose and heel-to-shin performed accurately bilaterally. Gait and Station: Arises from chair without difficulty. Stance is slightly hunched. Gait demonstrates normal stride length and balance without use of AD.  Reflexes: 1+ and symmetric. Toes downgoing.     NIHSS  0 Modified Rankin  0      ASSESSMENT: DSHAUN DEETS is a 77 y.o. 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 Vascular risk factors include A-fib, severe AS s/p TAVR, CAD and non-STEMI s/p LAD PCI/DESx3, HTN, HLD and advanced age.      PLAN:  Right MCA/ACA stroke:  Recovered well without residual deficit Continue Eliquis 5mg  twice daily and Zetia 10mg  daily for secondary stroke prevention managed/prescribed by PCP/cardiology.  History of multiple statin intolerances, insurance approved Leqvio but patient refuses d/t prior intolerances and concern of potential side effects, patient and son  verbalized increased risk of uncontrolled HLD Continue to follow with cardiology for atrial fibrillation and Eliquis management, has f/u visit next week Discussed secondary stroke prevention measures and importance of close PCP follow up for aggressive stroke risk factor management including BP goal<130/90 and HLD with LDL goal<70  Stroke labs 06/2023: LDL 170, A1c 5.1 I have gone over the pathophysiology of stroke, warning signs and symptoms, risk factors and their management in some detail with instructions to go to the closest emergency room for symptoms of concern.     Doing well from stroke standpoint without further recommendations and risk factors are managed by PCP/cardiology. He may follow up PRN, as usual for our patients who are strictly being followed for stroke. If any new neurological issues should arise, request PCP place referral for evaluation by one of our neurologists. Thank you.     CC:  [facility] provider: Dr. Pearlean Brownie PCP: Assunta Found, MD    I spent 57 minutes of face-to-face and non-face-to-face time with patient and son.  This included previsit chart review, lab review including review of recent hospitalization, study review, order entry, electronic health record documentation, patient education and discussion regarding above diagnoses and treatment plan and answered all other questions to  patient and son's satisfaction  Ihor Austin, AGNP-BC  [facility] [address] [place], [place] [address]  Phone [phone] Fax [phone] Note: This document was prepared with digital dictation and possible smart phrase technology. Any transcriptional errors that result from this process are unintentional.

## 2023-08-06 NOTE — Progress Notes (Signed)
[facility]                                        Cardiology Office Note:    Date:  08/10/2023   ID:  Jonathan Romero, DOB 03-06-1946, MRN [id number]  PCP:  Assunta Found, MD  [facility] Cardiologist:  Tsuyako Jolley [place], MD  [facility] Electrophysiologist:  None   Referring MD: Assunta Found, MD   Follow up Afib, CAD, s/p TAVR  History of Present Illness:    Jonathan Romero is a 77 y.o. male with a hx of HLD, SVT, CAD requiring LAD PCI/DES x3 (08/13/22), severe AS s/p TAVR (08/19/22) and afib with RVR who presents to clinic for follow up.   On 08/09/22 he had two episodes of exertional syncope episode. His wife called 911 and EMS found the patient in SVT with a reported HR of 280. He was then cardioverted into NSR. Upon arrival to the ED he was found to have a heart murmur and NSTEMI. Echo 08/10/22 revealed EF 40-45% with severe AS with a mean gradient of 45.0 mmHg, peak gradient of 67.74mmHg, and AVA 0.53 cm2. R/LHC was 08/11/22 showed a 95% calcified LAD lesion. He was evaluated by the multidisciplinary valve team and felt to not be a surgical candidate due to significant aortic calcification found on CTA. He ultimately underwent successful PCI with orbital atherectomy, PTCA, and placement of 3 overlapping drug-eluting stents from the proximal to mid LAD on 08/13/22. He was discharged home on 08/14/22 with plans to return on 08/19/22 for TAVR.  He underwent successful TAVR with a 26 mm Edwards Sapien 3 Ultra Resilia THV via the TF approach on 08/19/22. Post operative echo showed EF 55%, normally functioning TAVR with a mean gradient of 12 mmHg and no PVL. He was continued on DAPT.   He has done well in follow up with an improvement in his symptoms. He was seen on 09/24/22 for 1 month follow up. Echo showed EF 40-45%, normally functioning TAVR with a mean gradient of 8.8 mmHg and no PVL. He was noted to be in rapid afib, which was asymptomatic. Toprol 25mg  daily increased to  BID. Started on Elquis which was later converted to Coumadin due to cost issues. Aspirin discontinued. s/p TEE guided DCCV on 10/09/22. He was seen in Afib clinic on 10/16/22 and was still in NSR at that time. Of note TEE showed significant decrease in LV function with EF 25%.   He was admitted in August with painless rectal bleeding felt to be due to hemorrhoids.   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 CTA demonstrated no large vessel occlusion, hemodynamically significant stenosis, or aneurysm in the head or neck. EKG and telemetry demonstrated NSR. CT head demonstrated no acute intracranial abnormality. MRI brain demonstrated small linear acute infarct in the right parietal lobe with no acute hemorrhage. Neurology did not recommend a TTE from a stroke standpoint as he recently had a TTE on 03/05/2023 with normal EF. His INR was subtherapeutic and coumadin was adjusted. He was later transitioned to Eliquis.  LDL was 170 and it was recommended he start Leqvio. His insurance did cover it but patient declined use- did not like the risks.    Today the patient presents to clinic for follow up. Here is here with son. He continues to feel well. No CP or SOB. No palpitations.  BP has been excellent. No LE edema, orthopnea or PND. He is active at home working on his house and car.   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 Excell Seltzer & Weldner   Severe aortic stenosis     Past  Surgical History:  Procedure Laterality Date   CARDIOVERSION N/A 10/09/2022   Procedure: CARDIOVERSION;  Surgeon: Pricilla Riffle, MD;  Location: [facility] ENDOSCOPY;  Service: Cardiovascular;  Laterality: N/A;   CATARACT EXTRACTION     CORONARY ATHERECTOMY N/A 08/13/2022   Procedure: CORONARY ATHERECTOMY;  Surgeon: Tonny Bollman, MD;  Location: [facility] INVASIVE CV LAB;  Service: Cardiovascular;  Laterality: N/A;   CORONARY STENT INTERVENTION N/A 08/13/2022   Procedure: CORONARY STENT INTERVENTION;  Surgeon: Tonny Bollman, MD;  Location: [facility] INVASIVE CV LAB;  Service: Cardiovascular;  Laterality: N/A;   DOPPLER ECHOCARDIOGRAPHY  12/21/2012   EF 55-60%,   DOPPLER ECHOCARDIOGRAPHY  12/16/2011   MILD AORTIC STENOSIS,PEAK AND MEAN GRADIENTS OF 18 AND 8 mmHg and a valve area of around 2 square cm.    INTRAOPERATIVE TRANSTHORACIC ECHOCARDIOGRAM N/A 08/19/2022   Procedure: INTRAOPERATIVE TRANSTHORACIC ECHOCARDIOGRAM;  Surgeon: Tonny Bollman, MD;  Location: [facility] OR;  Service: Open Heart Surgery;  Laterality: N/A;   RIGHT/LEFT HEART CATH AND CORONARY ANGIOGRAPHY N/A 08/11/2022   Procedure: RIGHT/LEFT HEART CATH AND CORONARY ANGIOGRAPHY;  Surgeon: Lyn Records, MD;  Location: [facility] INVASIVE CV LAB;  Service: Cardiovascular;  Laterality: N/A;   TEE WITHOUT CARDIOVERSION N/A 10/09/2022   Procedure: TRANSESOPHAGEAL ECHOCARDIOGRAM (TEE);  Surgeon: Pricilla Riffle, MD;  Location: [facility] ENDOSCOPY;  Service: Cardiovascular;  Laterality: N/A;   TRANSCATHETER AORTIC VALVE REPLACEMENT, TRANSFEMORAL N/A 08/19/2022   Procedure: Transcatheter Aortic Valve Replacement, Transfemoral using a 26 MM Edwards SAPIEN 3 Ultra;  Surgeon: Tonny Bollman, MD;  Location: [facility] OR;  Service: Open Heart Surgery;  Laterality: N/A;  Transfemoral    Current Medications: Current Meds  Medication Sig   apixaban (ELIQUIS) 5 MG TABS tablet Take 1 tablet (5 mg total) by mouth 2 (two) times daily.   clopidogrel (PLAVIX) 75 MG tablet Take 1 tablet (75 mg  total) by mouth daily.   ezetimibe (ZETIA) 10 MG tablet Take 1 tablet (10 mg total) by mouth daily.   furosemide (LASIX) 20 MG tablet Take 1 tablet (20 mg total) by mouth daily as needed for fluid or edema (if bottom bp number is above 80).   hydrocortisone (ANUSOL-HC) 25 MG suppository Unwrap and place 1 suppository (25 mg total) rectally 2 (two) times daily.   hydrocortisone cream 1 % Apply 1 Application topically daily as needed (hemorrhoids).   metoprolol succinate (TOPROL-XL) 25 MG 24 hr tablet Take 1 tablet (25 mg total) by mouth daily. (Patient taking differently: 2 tablets daily.)   nitroGLYCERIN (NITROSTAT) 0.4 MG SL tablet Place 1 tablet (0.4 mg total) under the tongue every 5 (five) minutes x 3 doses as needed for chest pain.   polyethylene glycol  powder (GLYCOLAX/MIRALAX) 17 GM/SCOOP powder Take 17 g by mouth daily.     Allergies:   Doxycycline and Statins   Social History   Socioeconomic History   Marital status: Married    Spouse name: Henrita   Number of children: 3   Years of education: Not on file   Highest education level: High school graduate  Occupational History   Occupation: retired  Tobacco Use   Smoking status: Former    Current packs/day: 0.00    Types: Cigarettes    Quit date: 04/15/1983    Years since quitting: 40.3   Smokeless tobacco: Never   Tobacco comments:    Former smoker 10/16/22  Vaping Use   Vaping status: Never Used  Substance and Sexual Activity   Alcohol use: Yes    Comment: beer occasionally 10/16/22   Drug use: No   Sexual activity: Not on file  Other Topics Concern   Not on file  Social History Narrative   Not on file   Social Determinants of Health   Financial Resource Strain: Low Risk  (08/13/2022)   Overall Financial Resource Strain (CARDIA)    Difficulty of Paying Living Expenses: Not hard at all  Food Insecurity: No Food Insecurity (07/03/2023)   Hunger Vital Sign    Worried About Running Out of Food in the Last Year:  Never true    Ran Out of Food in the Last Year: Never true  Transportation Needs: No Transportation Needs (07/03/2023)   PRAPARE - Administrator, Civil Service (Medical): No    Lack of Transportation (Non-Medical): No  Physical Activity: Not on file  Stress: Not on file  Social Connections: Not on file     Family History: The patient's family history includes Diabetes in his mother; Heart failure in his father.  ROS:   Please see the history of present illness.    All other systems reviewed and are negative.  EKGs/Labs/Other Studies Reviewed:    The following studies were reviewed today:  TAVR OPERATIVE NOTE     Date of Procedure:                08/19/2022   Preoperative Diagnosis:      Severe Aortic Stenosis    Postoperative Diagnosis:    Same    Procedure:        Transcatheter Aortic Valve Replacement - Percutaneous  Transfemoral Approach             Edwards Sapien 3 Ultra Resilia THV (size 26 mm, serial #[id number])              Co-Surgeons:                        Eugenio Hoes, MD and Tonny Bollman, MD   Anesthesiologist:                  Eilene Ghazi, MD   Echocardiographer:              Thurmon Fair, MD   Pre-operative Echo Findings: Severe aortic stenosis Normal left ventricular systolic function   Post-operative Echo Findings: No paravalvular leak Normal/unchanged left ventricular systolic function   ___________________________     Echo 08/20/22:  IMPRESSIONS  1. Left ventricular ejection fraction, by estimation, is 55 to 60%. The  left ventricle has normal function. The left ventricle demonstrates  regional wall motion abnormalities (see scoring diagram/findings for  description). Left ventricular diastolic  parameters are  consistent with Grade II diastolic dysfunction  (pseudonormalization). Elevated left atrial pressure. There is moderate  hypokinesis of the left ventricular, basal-mid inferolateral wall.   2. Right ventricular  systolic function is normal. The right ventricular  size is normal. There is mildly elevated pulmonary artery systolic  pressure.   3. Left atrial size was moderately dilated.   4. The mitral valve is normal in structure. Trivial mitral valve  regurgitation. Moderate mitral annular calcification.   5. The aortic valve has been repaired/replaced. Aortic valve  regurgitation is not visualized. There is a valve present in the aortic  position. Echo findings are consistent with normal structure and function  of the aortic valve prosthesis. Aortic valve  mean gradient measures 12.0 mmHg. Aortic valve Vmax measures 2.39 m/s.  Aortic valve acceleration time measures 85 msec.   6. The inferior vena cava is dilated in size with <50% respiratory  variability, suggesting right atrial pressure of 15 mmHg.   Comparison(s): Prior images reviewed side by side. The left ventricular  function has improved.   ______________________  Echo 09/24/22 IMPRESSIONS   1. Left ventricular ejection fraction, by estimation, is 40 to 45%. The  left ventricle has mildly decreased function. The left ventricle  demonstrates global hypokinesis. Left ventricular diastolic function could  not be evaluated.   2. Right ventricular systolic function is normal. The right ventricular  size is normal. There is normal pulmonary artery systolic pressure.   3. Left atrial size was moderately dilated.   4. The mitral valve is normal in structure. Trivial mitral valve  regurgitation. No evidence of mitral stenosis.   5. The aortic valve has been repaired/replaced. Aortic valve  regurgitation is not visualized. There is a 26 mm Edwards Sapien  prosthetic (TAVR) valve present in the aortic position. Echo findings are  consistent with normal structure and function of the  aortic valve prosthesis. Aortic valve area, by VTI measures 1.65 cm.  Aortic valve mean gradient measures 8.8 mmHg.   6. The inferior vena cava is normal in  size with greater than 50%  respiratory variability, suggesting right atrial pressure of 3 mmHg.   Comparison(s): Prior images reviewed side by side. Changes from prior  study are noted. EF reduced compared to echo from 08/20/22, but is similar  to echo from 08/10/22 (pre-TAVR).   Conclusion(s)/Recommendation(s): In atrial fibrillation with RVR  throughout study.   TEE 10/09/22: IMPRESSIONS     1. LV function appears severely depressed with global hypokinesis. . Left  ventricular ejection fraction, by estimation, is 25%. The left ventricle  has severely decreased function.   2. Right ventricular systolic function is severely reduced. The right  ventricular size is normal.   3. No left atrial/left atrial appendage thrombus was detected.   4. The mitral valve is normal in structure. Mild mitral valve  regurgitation.   5. S/p TAVR (26 mm Edwards S3UR; procedure date 08/19/22) Leaflets open  well . The aortic valve has been repaired/replaced. Aortic valve  regurgitation is not visualized. There is a 26 mm Sapien prosthetic (TAVR)  valve present in the aortic position.  Procedure Date: 08/19/22.   6. There is mild (Grade II) plaque.   Echo 03/05/23: IMPRESSIONS     1. Left ventricular ejection fraction, by estimation, is 60 to 65%. Left  ventricular ejection fraction by 3D volume is 63 %. The left ventricle has  normal function. The left ventricle has no regional wall motion  abnormalities. There is mild left  ventricular  hypertrophy. Left ventricular diastolic parameters were  grossly normal.   2. Right ventricular systolic function is normal. The right ventricular  size is normal. Tricuspid regurgitation signal is inadequate for assessing  PA pressure.   3. Left atrial size was mild to moderately dilated.   4. The mitral valve is degenerative. Trivial mitral valve regurgitation.  No evidence of mitral stenosis.   5. The aortic valve has been repaired/replaced. Aortic valve   regurgitation is not visualized. There is a 26 mm Sapien prosthetic (TAVR)  valve present in the aortic position. Procedure Date: 08/19/22. Echo  findings are consistent with normal structure  and function of the aortic valve prosthesis. Aortic valve area, by VTI  measures 2.22 cm. Aortic valve mean gradient measures 9.0 mmHg. Aortic  valve Vmax measures 2.01 m/s.   6. The inferior vena cava is normal in size with greater than 50%  respiratory variability, suggesting right atrial pressure of 3 mmHg.    EKG:  EKG is ordered today.  This shows rapid afib with HR 130.   Recent Labs: 08/20/2022: Magnesium 2.2 05/13/2023: ALT 18 07/04/2023: BUN 19; Creatinine, Ser 0.87; Hemoglobin 14.6; Platelets 202; Potassium 4.0; Sodium 135  Recent Lipid Panel    Component Value Date/Time   CHOL 232 (H) 07/03/2023 1740   CHOL 200 (H) 03/21/2013 1142   TRIG 90 07/03/2023 1740   TRIG 192 (H) 03/21/2013 1142   HDL 44 07/03/2023 1740   HDL 31 (L) 03/21/2013 1142   CHOLHDL 5.3 07/03/2023 1740   VLDL 18 07/03/2023 1740   LDLCALC 170 (H) 07/03/2023 1740   LDLCALC 131 (H) 03/21/2013 1142     Risk Assessment/Calculations:       Physical Exam:    VS:  BP 132/86   Pulse 64   Ht 5\' 10"  (1.778 m)   Wt 146 lb 9.6 oz (66.5 kg)   SpO2 99%   BMI 21.03 kg/m     Wt Readings from Last 3 Encounters:  08/10/23 146 lb 9.6 oz (66.5 kg)  08/05/23 147 lb (66.7 kg)  05/14/23 142 lb 3.2 oz (64.5 kg)     GEN:  Well nourished, well developed in no acute distress HEENT: Normal NECK: No JVD LYMPHATICS: No lymphadenopathy CARDIAC: irreg irreg, tachy.  no murmurs, rubs, gallops RESPIRATORY:  Clear to auscultation without rales, wheezing or rhonchi  ABDOMEN: Soft, non-tender, non-distended MUSCULOSKELETAL:  No edema; No deformity  SKIN: Warm and dry. NEUROLOGIC:  Alert and oriented x 3 PSYCHIATRIC:  Normal affect   ASSESSMENT:    1. S/P TAVR (transcatheter aortic valve replacement)   2. Persistent  atrial fibrillation (HCC)   3. CAD S/P percutaneous coronary angioplasty   4. Chronic systolic CHF (congestive heart failure) (HCC)   5. SVT (supraventricular tachycardia) (HCC)   6. Cerebrovascular accident (CVA), unspecified mechanism (HCC)        PLAN:    In order of problems listed above:  Atrial fibrillation with RVR: s/p TEE guided DCCV in January. Has maintained NSR since then.Was on coumadin but with recent CVA has been switched to Eliquis.  Severe AS s/p TAVR: valve stable.   HLD: continue ezetimibe 10mg  daily. LDL 170. He is statin intolerant (has been tried on at least 3 by previous PCP). Declines additional therapy.   CAD: underwent successful PCI with orbital atherectomy, PTCA, and placement of 3 overlapping drug-eluting stents from the proximal to mid LAD on 08/13/22. Continue Plavix and Eliquis. As above, statin intolerant.    Chronic Systolic  CHF. EF significantly decreased on TEE to 25%. 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 Repeat Echo in June showed improvement to 60-65%  S/p right parietal CVA with subtherapeutic INR. Now on Eliquis  Follow up in 6 months  Signed, Hasani Diemer [place], MD  08/10/2023 3:41 PM    [facility]

## 2023-08-10 ENCOUNTER — Ambulatory Visit: Payer: Medicare Other | Attending: Cardiology | Admitting: Cardiology

## 2023-08-10 ENCOUNTER — Encounter: Payer: Self-pay | Admitting: Cardiology

## 2023-08-10 VITALS — BP 132/86 | HR 64 | Ht 70.0 in | Wt 146.6 lb

## 2023-08-10 DIAGNOSIS — I471 Supraventricular tachycardia, unspecified: Secondary | ICD-10-CM | POA: Diagnosis not present

## 2023-08-10 DIAGNOSIS — Z952 Presence of prosthetic heart valve: Secondary | ICD-10-CM

## 2023-08-10 DIAGNOSIS — I2510 Atherosclerotic heart disease of native coronary artery without angina pectoris: Secondary | ICD-10-CM

## 2023-08-10 DIAGNOSIS — I639 Cerebral infarction, unspecified: Secondary | ICD-10-CM | POA: Diagnosis not present

## 2023-08-10 DIAGNOSIS — I5022 Chronic systolic (congestive) heart failure: Secondary | ICD-10-CM

## 2023-08-10 DIAGNOSIS — I4819 Other persistent atrial fibrillation: Secondary | ICD-10-CM

## 2023-08-10 DIAGNOSIS — Z9861 Coronary angioplasty status: Secondary | ICD-10-CM | POA: Insufficient documentation

## 2023-08-10 NOTE — Patient Instructions (Signed)

## 2023-08-25 NOTE — Progress Notes (Unsigned)
[facility]   MULTIDISCIPLINARY HEART VALVE CLINIC                                     Cardiology Office Note:    Date:  08/26/2023   ID:  GRANDON TORSIELLO, DOB 1946/09/08, MRN [id number]  PCP:  Assunta Found, MD  [facility] Cardiologist:  Peter Swaziland, MD / Dr. Excell Seltzer and Dr. Leafy Ro (TAVR)  [facility] Electrophysiologist:  None   Referring MD: Assunta Found, MD   1 year s/p TAVR  History of Present Illness:    Jonathan Romero is a 77 y.o. male with a hx of HLD, SVT, CAD requiring LAD PCI/DES x3 (08/13/22), PAF on Eliquis and severe AS s/p TAVR (08/19/22) who presents to clinic for follow up.    On 08/09/22 he had two episodes of exertional syncope episode. His wife called 911 and EMS found the patient in SVT with a reported HR of 280. He was then cardioverted into NSR. Upon arrival to the ED he was found to have a heart murmur and NSTEMI. Echo 08/10/22 revealed EF 40-45% with severe AS with a mean gradient of 45.0 mmHg and AVA 0.53 cm2. [facility] 08/11/22 showed a 95% calcified LAD lesion. He was evaluated by the multidisciplinary valve team and felt to not be a surgical candidate due to significant aortic calcification found on CTA. He ultimately underwent successful PCI with orbital atherectomy, PTCA, and placement of 3 overlapping drug-eluting stents from the proximal to mid LAD on 08/13/22. He underwent staged TAVR with a 26 mm Edwards Sapien 3 Ultra Resilia THV via the TF approach on 08/19/22. Post operative echo showed EF 55%, normally functioning TAVR with a mean gradient of 12 mmHg and no PVL. He was continued on DAPT.    He has done well in follow up with an improvement in his symptoms. He was seen on 09/24/22 for 1 month follow up. Echo showed EF 40-45%, normally functioning TAVR with a mean gradient of 8.8 mmHg and no PVL. He was noted to be in rapid afib, which was asymptomatic. Started on Elquis which was later converted to Coumadin due to cost issues. Aspirin  discontinued. s/p TEE guided DCCV on 10/09/22. He was seen in Afib clinic on 10/16/22 and was still in NSR at that time. Of note TEE showed significant decrease in LV function with EF 25%. Repeat echo 03/12/23 showed normalization of LV function with restoration of sinus rhythm. Aov valve was stable.    He was admitted in August 2024 with painless rectal bleeding felt to be due to hemorrhoids.    Admitted in October 2024 with acute right parietal lobe CVA. MRI brain demonstrated small linear acute infarct in the right parietal lobe with no acute hemorrhage. His INR was subtherapeutic and coumadin was adjusted. He was later transitioned to Eliquis. LDL was 170 and it was recommended he start Leqvio. His insurance did cover it but patient declined use- did not like the risks.   Today the patient presents to clinic for follow up. Here with his daughter . No residual issues from CVA.No CP or SOB. No LE edema, orthopnea or PND. No dizziness or syncope. No blood in stool or urine. No palpitations.  Feels great and stay busy working.   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 Excell Seltzer & Leafy Ro   Severe aortic stenosis      Current Medications: Current Meds  Medication Sig   apixaban (ELIQUIS) 5 MG TABS tablet Take 1 tablet (5 mg total) by mouth 2 (two) times daily.   clopidogrel (PLAVIX) 75 MG tablet Take 1 tablet (75 mg total) by mouth daily.   ezetimibe (ZETIA) 10 MG tablet Take 1 tablet (10  mg total) by mouth daily.   furosemide (LASIX) 20 MG tablet Take 1 tablet (20 mg total) by mouth daily as needed for fluid or edema (if bottom bp number is above 80).   hydrocortisone (ANUSOL-HC) 25 MG suppository Unwrap and place 1 suppository (25 mg total) rectally 2 (two) times daily.   hydrocortisone cream 1 % Apply 1 Application topically daily as needed (hemorrhoids).   metoprolol succinate (TOPROL-XL) 25 MG 24 hr tablet Take 1 tablet (25 mg total) by mouth daily. (Patient taking differently: 2 tablets daily.)   nitroGLYCERIN (NITROSTAT) 0.4 MG SL tablet Place 1 tablet (0.4 mg total) under the tongue every 5 (five) minutes x 3 doses as needed for chest pain.   polyethylene glycol powder (GLYCOLAX/MIRALAX) 17 GM/SCOOP powder Take 17 g by mouth daily.      ROS:   Please see the history of present illness.    All other systems reviewed and are negative.  EKGs       Risk Assessment/Calculations:    CHA2DS2-VASc Score = 4   This indicates a 4.8% annual risk of stroke. The patient's score is based upon: CHF History: 1 HTN History: 0 Diabetes History: 0 Stroke History: 0 Vascular Disease History: 1 Age Score: 2 Gender Score: 0          Physical Exam:    VS:  BP 137/72 (BP Location: Right Arm, Patient Position: Sitting, Cuff Size: Normal)   Pulse 63   Resp 16   Ht 5\' 10"  (1.778 m)   Wt 149 lb 9.6 oz (67.9 kg)   SpO2 98%   BMI 21.47 kg/m     Wt Readings from Last 3 Encounters:  08/26/23 149 lb 9.6 oz (67.9 kg)  08/10/23 146 lb 9.6 oz (66.5 kg)  08/05/23 147 lb (66.7 kg)     GEN: Well nourished, well developed in no acute distress NECK: No JVD CARDIAC: RRR, no murmurs, rubs, gallops RESPIRATORY:  Clear to auscultation without rales, wheezing or rhonchi  ABDOMEN: Soft, non-tender, non-distended EXTREMITIES:  No edema; No deformity.   ASSESSMENT:    1. S/P TAVR (transcatheter aortic valve replacement)   2. PAF (paroxysmal atrial fibrillation) (HCC)   3.  Hyperlipidemia, unspecified hyperlipidemia type   4. Coronary artery disease involving native heart without angina pectoris, unspecified vessel or lesion type   5. Cerebrovascular accident (CVA), unspecified mechanism (HCC)     PLAN:    In order of problems listed above:  Severe AS s/p TAVR: echo today shows EF 60%, normally functioning TAVR with a mean gradient of 7 mm hg and no PVL. NYHA class I  symptoms. Continue Eliquis 5mg  BID and Plavix 75mg  daily given overlapping LAD stents x3. Continue regular follow up with Dr. Swaziland   PAF: sounds regular on exam. Switched from Coumadin to Eliquis after recent CVA. Continue Toprol XL 50mg  daily.    HLD: continue ezetimibe 10mg  daily. He is statin intolerant (has been tried on at least 3 by previous PCP). I referred him to our lipid clinic to consider PCSK9i but he declined the referral.    CAD: underwent successful PCI with orbital atherectomy, PTCA, and placement of 3 overlapping drug-eluting stents from the proximal to mid LAD on 08/13/22. Continue Plavix and Eliquis. As above, statin intolerant.   S/p right parietal CVA: with subtherapeutic INR. Now on Eliquis. No residual deficits. Refuses statins.      Medication Adjustments/Labs and Tests Ordered: Current medicines are reviewed at length with the patient today.  Concerns regarding medicines are outlined above.  No orders of the defined types were placed in this encounter.  No orders of the defined types were placed in this encounter.   Patient Instructions  Medication Instructions:  Your physician recommends that you continue on your current medications as directed. Please refer to the Current Medication list given to you today.  *If you need a refill on your cardiac medications before your next appointment, please call your pharmacy*  Lab Work: None ordered today.  Testing/Procedures: None ordered today.  Follow-Up: At [facility], you and your health needs are our  priority.  As part of our continuing mission to provide you with exceptional heart care, we have created designated Provider Care Teams.  These Care Teams include your primary Cardiologist (physician) and Advanced Practice Providers (APPs -  Physician Assistants and Nurse Practitioners) who all work together to provide you with the care you need, when you need it.  We recommend signing up for the patient portal called "MyChart".  Sign up information is provided on this After Visit Summary.  MyChart is used to connect with patients for Virtual Visits (Telemedicine).  Patients are able to view lab/test results, encounter notes, upcoming appointments, etc.  Non-urgent messages can be sent to your provider as well.   To learn more about what you can do with MyChart, go to [email or web address].    Your next appointment:   5 month(s) in May 2025  You will receive a letter in the mail reminding you to call to schedule your appointment with Dr. Swaziland.  The format for your next appointment:   In Person  Provider:   Peter Swaziland, MD {   Signed, Cline Crock, PA-C  08/26/2023 3:45 PM    [facility]

## 2023-08-26 ENCOUNTER — Ambulatory Visit (HOSPITAL_COMMUNITY): Payer: Medicare Other | Attending: Physician Assistant

## 2023-08-26 ENCOUNTER — Ambulatory Visit (INDEPENDENT_AMBULATORY_CARE_PROVIDER_SITE_OTHER): Payer: Medicare Other

## 2023-08-26 VITALS — BP 137/72 | HR 63 | Resp 16 | Ht 70.0 in | Wt 149.6 lb

## 2023-08-26 DIAGNOSIS — I2510 Atherosclerotic heart disease of native coronary artery without angina pectoris: Secondary | ICD-10-CM

## 2023-08-26 DIAGNOSIS — I639 Cerebral infarction, unspecified: Secondary | ICD-10-CM

## 2023-08-26 DIAGNOSIS — I480 Paroxysmal atrial fibrillation: Secondary | ICD-10-CM

## 2023-08-26 DIAGNOSIS — Z952 Presence of prosthetic heart valve: Secondary | ICD-10-CM | POA: Diagnosis not present

## 2023-08-26 DIAGNOSIS — E785 Hyperlipidemia, unspecified: Secondary | ICD-10-CM

## 2023-08-26 LAB — ECHOCARDIOGRAM COMPLETE
AR max vel: 1.37 cm2
AV Area VTI: 1.43 cm2
AV Area mean vel: 1.42 cm2
AV Mean grad: 7 mm[Hg]
AV Peak grad: 13.4 mm[Hg]
Ao pk vel: 1.83 m/s
Area-P 1/2: 2.9 cm2
S' Lateral: 2.8 cm

## 2023-08-26 NOTE — Patient Instructions (Signed)
Medication Instructions:  Your physician recommends that you continue on your current medications as directed. Please refer to the Current Medication list given to you today.  *If you need a refill on your cardiac medications before your next appointment, please call your pharmacy*  Lab Work: None ordered today.  Testing/Procedures: None ordered today.  Follow-Up: At [facility], you and your health needs are our priority.  As part of our continuing mission to provide you with exceptional heart care, we have created designated Provider Care Teams.  These Care Teams include your primary Cardiologist (physician) and Advanced Practice Providers (APPs -  Physician Assistants and Nurse Practitioners) who all work together to provide you with the care you need, when you need it.  We recommend signing up for the patient portal called "MyChart".  Sign up information is provided on this After Visit Summary.  MyChart is used to connect with patients for Virtual Visits (Telemedicine).  Patients are able to view lab/test results, encounter notes, upcoming appointments, etc.  Non-urgent messages can be sent to your provider as well.   To learn more about what you can do with MyChart, go to [email or web address].    Your next appointment:   5 month(s) in May 2025  You will receive a letter in the mail reminding you to call to schedule your appointment with Dr. Swaziland.  The format for your next appointment:   In Person  Provider:   Peter Swaziland, MD {

## 2023-09-11 ENCOUNTER — Telehealth: Payer: Self-pay

## 2023-09-11 NOTE — Telephone Encounter (Signed)
Jonathan Romero patient assistance form for Eliquis completed and faxed to fax # [phone].

## 2023-09-24 ENCOUNTER — Other Ambulatory Visit: Payer: Self-pay

## 2023-09-24 MED ORDER — EZETIMIBE 10 MG PO TABS: 10.0000 mg | ORAL_TABLET | Freq: Every day | ORAL | 3 refills | Status: AC

## 2023-09-24 MED ORDER — CLOPIDOGREL BISULFATE 75 MG PO TABS: 75.0000 mg | ORAL_TABLET | Freq: Every day | ORAL | 3 refills | Status: DC

## 2023-09-24 NOTE — Addendum Note (Signed)
 Addended by: Margaret Pyle D on: 09/24/2023 02:07 PM   Modules accepted: Orders

## 2023-11-02 ENCOUNTER — Other Ambulatory Visit: Payer: Self-pay | Admitting: Physician Assistant

## 2023-12-08 NOTE — Telephone Encounter (Signed)
 Error

## 2024-02-21 ENCOUNTER — Encounter (HOSPITAL_COMMUNITY): Payer: Self-pay

## 2024-02-21 ENCOUNTER — Emergency Department (HOSPITAL_COMMUNITY): Admission: EM | Admit: 2024-02-21 | Discharge: 2024-02-21 | Disposition: A | Discharge status: Home / Self Care

## 2024-02-21 ENCOUNTER — Other Ambulatory Visit: Payer: Self-pay

## 2024-02-21 ENCOUNTER — Emergency Department (HOSPITAL_COMMUNITY)

## 2024-02-21 DIAGNOSIS — I10 Essential (primary) hypertension: Secondary | ICD-10-CM | POA: Insufficient documentation

## 2024-02-21 DIAGNOSIS — Z7901 Long term (current) use of anticoagulants: Secondary | ICD-10-CM | POA: Diagnosis not present

## 2024-02-21 DIAGNOSIS — Z79899 Other long term (current) drug therapy: Secondary | ICD-10-CM | POA: Diagnosis not present

## 2024-02-21 DIAGNOSIS — I6789 Other cerebrovascular disease: Secondary | ICD-10-CM | POA: Insufficient documentation

## 2024-02-21 DIAGNOSIS — R413 Other amnesia: Secondary | ICD-10-CM | POA: Diagnosis not present

## 2024-02-21 LAB — BASIC METABOLIC PANEL WITH GFR
Anion gap: 7 (ref 5–15)
BUN: 15 mg/dL (ref 8–23)
CO2: 27 mmol/L (ref 22–32)
Calcium: 8.8 mg/dL — ABNORMAL LOW (ref 8.9–10.3)
Chloride: 103 mmol/L (ref 98–111)
Creatinine, Ser: 0.72 mg/dL (ref 0.61–1.24)
GFR, Estimated: >60 mL/min (ref >60)
Glucose, Bld: 90 mg/dL (ref 70–99)
Potassium: 4.1 mmol/L (ref 3.5–5.1)
Sodium: 137 mmol/L (ref 135–145)

## 2024-02-21 LAB — CBC WITH DIFFERENTIAL/PLATELET
Abs Immature Granulocytes: 0.02 10*3/uL (ref 0.00–0.07)
Basophils Absolute: 0.1 10*3/uL (ref 0.0–0.1)
Basophils Relative: 2 %
Eosinophils Absolute: 0.3 10*3/uL (ref 0.0–0.5)
Eosinophils Relative: 5 %
HCT: 40.7 % (ref 39.0–52.0)
Hemoglobin: 13.7 g/dL (ref 13.0–17.0)
Immature Granulocytes: 0 %
Lymphocytes Relative: 25 %
Lymphs Abs: 1.4 10*3/uL (ref 0.7–4.0)
MCH: 32.9 pg (ref 26.0–34.0)
MCHC: 33.7 g/dL (ref 30.0–36.0)
MCV: 97.8 fL (ref 80.0–100.0)
Monocytes Absolute: 0.6 10*3/uL (ref 0.1–1.0)
Monocytes Relative: 10 %
Neutro Abs: 3.4 10*3/uL (ref 1.7–7.7)
Neutrophils Relative %: 58 %
Platelets: 149 10*3/uL — ABNORMAL LOW (ref 150–400)
RBC: 4.16 MIL/uL — ABNORMAL LOW (ref 4.22–5.81)
RDW: 12.8 % (ref 11.5–15.5)
WBC: 5.8 10*3/uL (ref 4.0–10.5)
nRBC: 0 % (ref 0.0–0.2)

## 2024-02-21 LAB — URINALYSIS, ROUTINE W REFLEX MICROSCOPIC
Bilirubin Urine: NEGATIVE
Glucose, UA: NEGATIVE mg/dL
Hgb urine dipstick: NEGATIVE
Ketones, ur: NEGATIVE mg/dL
Leukocytes,Ua: NEGATIVE
Nitrite: NEGATIVE
Protein, ur: NEGATIVE mg/dL
Specific Gravity, Urine: 1.015 (ref 1.005–1.030)
pH: 6 (ref 5.0–8.0)

## 2024-02-21 MED ORDER — AMLODIPINE BESYLATE 5 MG PO TABS: 5.0000 mg | ORAL_TABLET | Freq: Every day | ORAL | 0 refills | Status: DC

## 2024-02-21 NOTE — ED Triage Notes (Addendum)
 PT arrived POV from home c/o hypertension. Pt's daughter states last time he checked it last night it was 200/114 before taking his metoprolol  this morning. After taking his meds his current BP is 186/86

## 2024-02-21 NOTE — Discharge Instructions (Addendum)
 Your workup today was reassuring.  Please continue the metoprolol  for blood pressure.  If your blood pressure remains elevated and is over 200 systolic despite continuing the metoprolol  start the amlodipine  as well.  If your blood pressure is elevated and you are asymptomatic I have placed a consult to try to get you in with a new primary care provider.  I would follow-up with them and not start the amlodipine .  If you develop chest pain, weakness, numbness, tingling, or other concerning symptoms please return to the emergency department for worsening symptoms.  Thank you for the opportunity to take care of you in our Emergency Department. You have been diagnosed with high blood pressure, also known as hypertension. This means that the force of blood against the walls of your blood vessels called is too strong. It also means that your heart has to work harder to move the blood. High blood pressure usually has no symptoms, but over time, it can cause serious health problems such as Heart attack and heart failure Stroke Kidney disease and failure Vision loss With the help from your healthcare provider and some important life style changes, you can manage your blood pressure and protect your health. Please read the instructions provided on hypertension, how to manage it and how to check your blood pressure. Additionally, use the blood pressure log provided to record your blood pressures. Take the blood pressure log with you to your primary care doctor so that they can adjust your blood pressure medications if needed. Please read the instructions on follow-up appointment. Return to the ER or Call 911 right away if you have any of these symptoms: Chest pain or shortness of breath Severe headache Weakness, tingling, or numbness of your face, arms, or legs (especially on 1 side of the body) Sudden change in vision Confusion, trouble speaking, or trouble understanding speech

## 2024-02-21 NOTE — ED Notes (Signed)
 Pt to CT

## 2024-02-21 NOTE — ED Provider Notes (Signed)
 [place] EMERGENCY DEPARTMENT AT [facility] Provider Note   CSN: [id number] Arrival date & time: 02/21/24  4098     History  Chief Complaint  Patient presents with   Hypertension    Jonathan Romero is a 78 y.o. male.  78 year old male with past medical history of hypertension presenting to the emergency department today with concern for elevated blood pressure.  The patient checks his blood pressure twice daily and states that over the past few days that has been more elevated than normal despite taking his medications.  He denies any headaches.  Denies any chest pain.  Denies any shortness of breath, weakness, numbness, or tingling.  He has apparently been a little more forgetful than normal over the past 3 to 4 weeks.  The patient has been afebrile.  He does report that he does have occasional dysuria.  His blood pressure was over 200 systolic today so his family brought him in for further evaluation.   Hypertension       Home Medications Prior to Admission medications   Medication Sig Start Date End Date Taking? Authorizing Provider  amLODipine  (NORVASC ) 5 MG tablet Take 1 tablet (5 mg total) by mouth daily. 02/21/24  Yes Carin Charleston, MD  apixaban  (ELIQUIS ) 5 MG TABS tablet Take 1 tablet (5 mg total) by mouth 2 (two) times daily. 07/07/23   Ardia Kraft, PA-C  ciprofloxacin -dexamethasone  (CIPRODEX ) OTIC suspension Place 4 drops into the left ear 2 (two) times daily. Patient not taking: Reported on 08/10/2023 06/26/23   Corbin Dess, PA-C  clopidogrel  (PLAVIX ) 75 MG tablet Take 1 tablet (75 mg total) by mouth daily. 09/24/23   Swaziland, Peter M, MD  ezetimibe  (ZETIA ) 10 MG tablet Take 1 tablet (10 mg total) by mouth daily. 09/24/23   Swaziland, Peter M, MD  furosemide  (LASIX ) 20 MG tablet Take 1 tablet (20 mg total) by mouth daily as needed for fluid or edema (if bottom bp number is above 80). 07/04/23   Maxie Spaniel, MD  hydrocortisone  (ANUSOL -HC) 25 MG  suppository Unwrap and place 1 suppository (25 mg total) rectally 2 (two) times daily. 05/15/23   Enrigue Harvard, DO  hydrocortisone  cream 1 % Apply 1 Application topically daily as needed (hemorrhoids).    [provider]  metoprolol  succinate (TOPROL -XL) 25 MG 24 hr tablet Take 1 tablet by mouth twice daily 11/03/23   Swaziland, Peter M, MD  nitroGLYCERIN  (NITROSTAT ) 0.4 MG SL tablet Place 1 tablet (0.4 mg total) under the tongue every 5 (five) minutes x 3 doses as needed for chest pain. 08/14/22   [facility], Warren Haber, PA  polyethylene glycol powder (GLYCOLAX /MIRALAX ) 17 GM/SCOOP powder Take 17 g by mouth daily. 05/15/23   Vann, Jessica U, DO      Allergies    Doxycycline  and Statins    Review of Systems   Review of Systems  All other systems reviewed and are negative.   Physical Exam Updated Vital Signs BP (!) 166/77   Pulse (!) 56   Temp 97.8 F (36.6 C) (Oral)   Resp 13   Ht 5\' 10"  (1.778 m)   Wt 68.9 kg   SpO2 100%   BMI 21.81 kg/m  Physical Exam Vitals and nursing note reviewed.   Gen: NAD Eyes: PERRL, EOMI HEENT: no oropharyngeal swelling Neck: trachea midline, meningismus Resp: clear to auscultation bilaterally Card: RRR, no murmurs, rubs, or gallops Abd: nontender, nondistended Extremities: no calf tenderness, no edema Vascular: 2+ radial  pulses bilaterally, 2+ DP pulses bilaterally Neuro: Cranial nerves intact, equal strength and sensation throughout bilateral upper and lower extremities with no dysmetria on finger-to-nose testing Skin: no rashes Psyc: acting appropriately   ED Results / Procedures / Treatments   Labs (all labs ordered are listed, but only abnormal results are displayed) Labs Reviewed  CBC WITH DIFFERENTIAL/PLATELET - Abnormal; Notable for the following components:      Result Value   RBC 4.16 (*)    Platelets 149 (*)    All other components within normal limits  BASIC METABOLIC PANEL WITH GFR - Abnormal; Notable for the following  components:   Calcium 8.8 (*)    All other components within normal limits  URINALYSIS, ROUTINE W REFLEX MICROSCOPIC    EKG None  Radiology CT Head Wo Contrast Result Date: 02/21/2024 EXAM: CT HEAD WITHOUT 02/21/2024 10:41:28 AM TECHNIQUE: CT of the head was performed without the administration of intravenous contrast. Automated exposure control, iterative reconstruction, and/or weight based adjustment of the mA/kV was utilized to reduce the radiation dose to as low as reasonably achievable. COMPARISON: 07/03/2023 CLINICAL HISTORY: Memory loss. PT arrived POV from home c/o hypertension. Pt's daughter states last time he checked it last night it was 200/114 before taking his metoprolol  this morning. After taking his meds his current BP is 186/86. FINDINGS: BRAIN AND VENTRICLES: No acute intracranial hemorrhage. No mass effect or midline shift. No extra-axial fluid collection. Gray-white differentiation is maintained. No hydrocephalus. Stable background of mild chronic small vessel disease and generalized volume loss within the expected range for age. ORBITS: Bilateral microphthalmia. SINUSES AND MASTOIDS: No acute abnormality. SOFT TISSUES AND SKULL: No acute skull fracture. No acute soft tissue abnormality. IMPRESSION: 1. No acute intracranial abnormality. 2. Stable background of mild chronic small vessel disease and generalized volume loss within the expected range for age. Electronically signed by: Audra Blend MD 02/21/2024 10:48 AM EDT RP Workstation: [id number]    Procedures Procedures    Medications Ordered in ED Medications - No data to display  ED Course/ Medical Decision Making/ A&P                                 Medical Decision Making 78 year old male with past medical history of hypertension hyperlipidemia presenting to the emergency department today with concern for elevated blood pressure.  The patient has been more forgetful than normal but does not really have any symptoms  of endorgan dysfunction.  Will obtain basic labs as well and has a CT scan of his head and urinalysis to evaluate for any endorgan dysfunction, urinary tract infection, or intracranial hemorrhage or mass lesion given the issue with his memory although this may also be due to early, undiagnosed dementia.  I will reevaluate for ultimate disposition.  The patient's work appears reassuring.  There are no findings of endorgan dysfunction here.  CT scan is negative.  He is discharged with return precautions.  Blood pressure did improve here.  Amount and/or Complexity of Data Reviewed Labs: ordered. Radiology: ordered.  Risk Prescription drug management.           Final Clinical Impression(s) / ED Diagnoses Final diagnoses:  Uncontrolled hypertension    Rx / DC Orders ED Discharge Orders          Ordered    Referral to [facility]BCI Care Management       Comments: Pharmacy Medication Management for Uncontrolled hypertension in the ED  02/21/24 1019    amLODipine  (NORVASC ) 5 MG tablet  Daily        02/21/24 1131              Carin Charleston, MD 02/21/24 1132

## 2024-02-23 ENCOUNTER — Telehealth: Payer: Self-pay | Admitting: *Deleted

## 2024-02-23 NOTE — Progress Notes (Signed)
 Care Guide Pharmacy Note  02/23/2024 Name: YIANNI SKILLING MRN: [id number] DOB: [birth year]  Referred By: Minus Amel, MD Reason for referral: Complex Care Management and Call Attempt #1 (Outreach to schedule referral with pharmacist )   SOMA LIZAK is a 78 y.o. year old male who is a primary care patient of Minus Amel, MD.  SMILEY BIRR was referred to the pharmacist for assistance related to: HTN  An unsuccessful telephone outreach was attempted today to contact the patient who was referred to the pharmacy team for assistance with medication management. Additional attempts will be made to contact the patient.  Kandis Ormond, CMA, [place]  [facility], [facility] Guide Direct Dial: [phone]  Fax: [phone] Website: [email or web address]

## 2024-02-23 NOTE — Progress Notes (Signed)
 Care Guide Pharmacy Note  02/23/2024 Name: Jonathan Romero MRN: [id number] DOB: [birth year]  Referred By: Minus Amel, MD Reason for referral: Complex Care Management and Call Attempt #1 (Outreach to schedule referral with pharmacist )   Jonathan Romero is a 78 y.o. year old male who is a primary care patient of Minus Amel, MD.  Jonathan Romero was referred to the pharmacist for assistance related to: HTN  Successful contact was made with the patient to discuss pharmacy services including being ready for the pharmacist to call at least 5 minutes before the scheduled appointment time and to have medication bottles and any blood pressure readings ready for review. The patient agreed to meet with the pharmacist via telephone visit on 03/04/2024  Kandis Ormond, CMA [place]  [facility], [facility] Guide Direct Dial: [phone]  Fax: [phone] Website: [email or web address]

## 2024-03-04 ENCOUNTER — Other Ambulatory Visit: Payer: Self-pay

## 2024-03-04 NOTE — Progress Notes (Signed)
   03/04/2024  Patient ID: Jonathan Romero, male   DOB: 1946-04-13, 78 y.o.   MRN: [id number]  Contacted patient via telephone for ER follow up visit for HTN.  Reviewed patient's medication list and identified several discrepancies between the instructions and how the patient is actually taking the medication.  Of most concern, only taking eliquis  once daily most days and insists he was told to hold the second dose of eliquis  for any DBP <80. Counseled that this is not how the medication is taken and will not work effectively, patient plans to discuss with cardio at appt on 6/24.  Never started taking amlodipine  prescribed in ER. Picked up but reports his BP normalized without it. Continues to check BP at home several times a day and does report normal readings, with most recent reading 107/87 HR 70 this morning.   Denies any sign/symptoms of low/high BP.  Does not have a PCP at this time. Prefers to stay local to [place] if possible, phone numbers to [facility] and [facility] provided. Also made patient aware that [facility] [place] is accepting new patients as well and provided their info. They prefer to research further and will contact the office of their choosing. Counseled on importance of PCP.   Recommend to take Eliquis  BID, may continue to take all other meds as is until meeting with cardio on 6/24. Counseled to continue to monitor BP and counseled on return ER precautions.   Provided my contact number for any additional questions or concerns or if any assistance needed establishing PCP.  Carnell Christian, PharmD Clinical Pharmacist [phone]

## 2024-03-12 NOTE — Progress Notes (Unsigned)
 [facility]                                        Cardiology Office Note:    Date:  03/15/2024   ID:  Jonathan Romero, DOB 1946/07/11, MRN [id number]  PCP:  Marvine Rush, MD  [facility] Cardiologist:  Katerine Morua [place], MD  [facility] Electrophysiologist:  None   Referring MD: Marvine Rush, MD   Follow up Afib, CAD, s/p TAVR  History of Present Illness:    Jonathan Romero is a 78 y.o. male with a hx of HLD, SVT, CAD requiring LAD PCI/DES x3 (08/13/22), severe AS s/p TAVR (08/19/22) and afib with RVR who presents to clinic for follow up.   On 08/09/22 he had two episodes of exertional syncope episode. His wife called 911 and EMS found the patient in SVT with a reported HR of 280. He was then cardioverted into NSR. Upon arrival to the ED he was found to have a heart murmur and NSTEMI. Echo 08/10/22 revealed EF 40-45% with severe AS with a mean gradient of 45.0 mmHg, peak gradient of 67.66mmHg, and AVA 0.53 cm2. R/LHC was 08/11/22 showed a 95% calcified LAD lesion. He was evaluated by the multidisciplinary valve team and felt to not be a surgical candidate due to significant aortic calcification found on CTA. He ultimately underwent successful PCI with orbital atherectomy, PTCA, and placement of 3 overlapping drug-eluting stents from the proximal to mid LAD on 08/13/22. He was discharged home on 08/14/22 with plans to return on 08/19/22 for TAVR.  He underwent successful TAVR with a 26 mm Edwards Sapien 3 Ultra Resilia THV via the TF approach on 08/19/22. Post operative echo showed EF 55%, normally functioning TAVR with a mean gradient of 12 mmHg and no PVL.   He has done well in follow up with an improvement in his symptoms. He was seen on 09/24/22 for 1 month follow up. Echo showed EF 40-45%, normally functioning TAVR with a mean gradient of 8.8 mmHg and no PVL. He was noted to be in rapid afib, which was asymptomatic. Toprol  25mg  daily increased to BID. Started on Elquis  s/p  TEE guided DCCV on 10/09/22. He was seen in Afib clinic on 10/16/22 and was still in NSR at that time. Of note TEE showed significant decrease in LV function with EF 25%.   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 CTA demonstrated no large vessel occlusion, hemodynamically significant stenosis, or aneurysm in the head or neck. EKG and telemetry demonstrated NSR. CT head demonstrated no acute intracranial abnormality. MRI brain demonstrated small linear acute infarct in the right parietal lobe with no acute hemorrhage. Neurology did not recommend a TTE from a stroke standpoint as he recently had a TTE on 03/05/2023 with normal EF. His INR was subtherapeutic and coumadin  was adjusted. He was later transitioned to Eliquis .  LDL was 170 and it was recommended he start Leqvio. His insurance did cover it but patient declined use- did not like the risks.    Today the patient presents to clinic for follow up. Here is here with son. He was seen in the ED in early June with markedly elevated BP. Labs were OK. Ecg NSR. Did CT Abd that was negative. DC on amlodipine  5 mg daily. He was already on Toprol . He is very confused about his medication. He has  been taking metoprolol  once a day rather than prescribed twice a day.  He has a Rx for lasix  to use PRN swelling but he has been taking this if BP is > 90 systolic because this is how it is written on the bottle. He states if BP low he also holds his Eliquis . He denies any swelling. No palpitations.    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 Wonda & Weldner   Severe aortic stenosis     Past Surgical History:  Procedure Laterality Date   CARDIOVERSION N/A 10/09/2022   Procedure: CARDIOVERSION;  Surgeon: Okey Vina GAILS, MD;  Location: [facility] ENDOSCOPY;  Service: Cardiovascular;  Laterality: N/A;   CATARACT EXTRACTION     CORONARY ATHERECTOMY N/A 08/13/2022   Procedure: CORONARY ATHERECTOMY;  Surgeon: Wonda Sharper, MD;  Location: [facility] INVASIVE CV LAB;  Service: Cardiovascular;  Laterality: N/A;   CORONARY STENT INTERVENTION N/A 08/13/2022   Procedure: CORONARY STENT INTERVENTION;  Surgeon: Wonda Sharper, MD;  Location: [facility] INVASIVE CV LAB;  Service: Cardiovascular;  Laterality: N/A;   DOPPLER ECHOCARDIOGRAPHY  12/21/2012   EF 55-60%,   DOPPLER ECHOCARDIOGRAPHY  12/16/2011   MILD AORTIC STENOSIS,PEAK AND MEAN GRADIENTS OF 18 AND 8 mmHg and a valve area of around 2 square cm.    INTRAOPERATIVE TRANSTHORACIC ECHOCARDIOGRAM N/A 08/19/2022   Procedure: INTRAOPERATIVE TRANSTHORACIC ECHOCARDIOGRAM;  Surgeon: Wonda Sharper, MD;  Location: [facility] OR;  Service: Open Heart Surgery;  Laterality: N/A;   RIGHT/LEFT HEART CATH AND CORONARY ANGIOGRAPHY N/A 08/11/2022   Procedure: RIGHT/LEFT HEART CATH AND CORONARY ANGIOGRAPHY;  Surgeon: Claudene Victory ORN, MD;  Location: [facility] INVASIVE CV LAB;  Service: Cardiovascular;  Laterality: N/A;   TEE WITHOUT CARDIOVERSION N/A 10/09/2022   Procedure: TRANSESOPHAGEAL ECHOCARDIOGRAM (TEE);  Surgeon: Okey Vina GAILS, MD;  Location: [facility] ENDOSCOPY;  Service: Cardiovascular;  Laterality: N/A;   TRANSCATHETER AORTIC VALVE REPLACEMENT, TRANSFEMORAL N/A 08/19/2022   Procedure: Transcatheter Aortic Valve Replacement, Transfemoral using a 26 MM Edwards SAPIEN 3 Ultra;  Surgeon: Wonda Sharper, MD;  Location: [facility] OR;  Service: Open Heart Surgery;  Laterality: N/A;  Transfemoral    Current Medications: Current Meds  Medication Sig    ezetimibe  (ZETIA ) 10 MG tablet Take 1 tablet (10 mg total) by mouth daily.   hydrocortisone  cream 1 % Apply 1 Application topically daily as needed (hemorrhoids).   nitroGLYCERIN  (NITROSTAT ) 0.4 MG SL tablet Place 1 tablet (0.4 mg total) under the tongue every 5 (five) minutes x 3 doses as needed for chest pain.   [DISCONTINUED] amLODipine  (NORVASC ) 5 MG tablet Take 1 tablet (5 mg total) by mouth daily.   [DISCONTINUED] apixaban  (ELIQUIS ) 5 MG TABS tablet Take 1 tablet (5 mg total) by mouth 2 (two) times daily. (Patient taking differently: Take 1 tablet (5 mg total) by mouth 2 (two) times daily.)   [DISCONTINUED] clopidogrel  (PLAVIX ) 75 MG tablet Take 1 tablet (75 mg total) by mouth daily.   [DISCONTINUED]  furosemide  (LASIX ) 20 MG tablet Take 1 tablet (20 mg total) by mouth daily as needed for fluid or edema (if bottom bp number is above 80).   [DISCONTINUED] metoprolol  succinate (TOPROL -XL) 25 MG 24 hr tablet Take 1 tablet by mouth twice daily (Patient taking differently: Take 1 tablet by mouth twice daily)     Allergies:   Doxycycline  and Statins   Social History   Socioeconomic History   Marital status: Married    Spouse name: Henrita   Number of children: 3   Years of education: Not on file   Highest education level: High school graduate  Occupational History   Occupation: retired  Tobacco Use   Smoking status: Former    Current packs/day: 0.00    Types: Cigarettes    Quit date: 04/15/1983    Years since quitting: 40.9   Smokeless tobacco: Never   Tobacco comments:    Former smoker 10/16/22  Vaping Use   Vaping status: Never Used  Substance and Sexual Activity   Alcohol use: Yes    Comment: beer occasionally 10/16/22   Drug use: No   Sexual activity: Not on file  Other Topics Concern   Not on file  Social History Narrative   Not on file   Social Drivers of Health   Financial Resource Strain: Low Risk  (08/13/2022)   Overall Financial Resource Strain (CARDIA)     Difficulty of Paying Living Expenses: Not hard at all  Food Insecurity: No Food Insecurity (07/03/2023)   Hunger Vital Sign    Worried About Running Out of Food in the Last Year: Never true    Ran Out of Food in the Last Year: Never true  Transportation Needs: No Transportation Needs (07/03/2023)   PRAPARE - Administrator, Civil Service (Medical): No    Lack of Transportation (Non-Medical): No  Physical Activity: Not on file  Stress: Not on file  Social Connections: Not on file     Family History: The patient's family history includes Diabetes in his mother; Heart failure in his father.  ROS:   Please see the history of present illness.    All other systems reviewed and are negative.  EKGs/Labs/Other Studies Reviewed:    The following studies were reviewed today:  TAVR OPERATIVE NOTE     Date of Procedure:                08/19/2022   Preoperative Diagnosis:      Severe Aortic Stenosis    Postoperative Diagnosis:    Same    Procedure:        Transcatheter Aortic Valve Replacement - Percutaneous  Transfemoral Approach             Edwards Sapien 3 Ultra Resilia THV (size 26 mm, serial #[id number])              Co-Surgeons:                        Deward Kallman, MD and Ozell Fell, MD   Anesthesiologist:                  Zachary Ee, MD   Echocardiographer:              Jerel Balding, MD   Pre-operative Echo Findings: Severe aortic stenosis Normal left ventricular systolic function   Post-operative Echo Findings: No paravalvular leak Normal/unchanged left ventricular systolic function   ___________________________  Echo 08/20/22:  IMPRESSIONS  1. Left ventricular ejection fraction, by estimation, is 55 to 60%. The  left ventricle has normal function. The left ventricle demonstrates  regional wall motion abnormalities (see scoring diagram/findings for  description). Left ventricular diastolic  parameters are consistent with Grade II diastolic  dysfunction  (pseudonormalization). Elevated left atrial pressure. There is moderate  hypokinesis of the left ventricular, basal-mid inferolateral wall.   2. Right ventricular systolic function is normal. The right ventricular  size is normal. There is mildly elevated pulmonary artery systolic  pressure.   3. Left atrial size was moderately dilated.   4. The mitral valve is normal in structure. Trivial mitral valve  regurgitation. Moderate mitral annular calcification.   5. The aortic valve has been repaired/replaced. Aortic valve  regurgitation is not visualized. There is a valve present in the aortic  position. Echo findings are consistent with normal structure and function  of the aortic valve prosthesis. Aortic valve  mean gradient measures 12.0 mmHg. Aortic valve Vmax measures 2.39 m/s.  Aortic valve acceleration time measures 85 msec.   6. The inferior vena cava is dilated in size with <50% respiratory  variability, suggesting right atrial pressure of 15 mmHg.   Comparison(s): Prior images reviewed side by side. The left ventricular  function has improved.   ______________________  Echo 09/24/22 IMPRESSIONS   1. Left ventricular ejection fraction, by estimation, is 40 to 45%. The  left ventricle has mildly decreased function. The left ventricle  demonstrates global hypokinesis. Left ventricular diastolic function could  not be evaluated.   2. Right ventricular systolic function is normal. The right ventricular  size is normal. There is normal pulmonary artery systolic pressure.   3. Left atrial size was moderately dilated.   4. The mitral valve is normal in structure. Trivial mitral valve  regurgitation. No evidence of mitral stenosis.   5. The aortic valve has been repaired/replaced. Aortic valve  regurgitation is not visualized. There is a 26 mm Edwards Sapien  prosthetic (TAVR) valve present in the aortic position. Echo findings are  consistent with normal structure and  function of the  aortic valve prosthesis. Aortic valve area, by VTI measures 1.65 cm.  Aortic valve mean gradient measures 8.8 mmHg.   6. The inferior vena cava is normal in size with greater than 50%  respiratory variability, suggesting right atrial pressure of 3 mmHg.   Comparison(s): Prior images reviewed side by side. Changes from prior  study are noted. EF reduced compared to echo from 08/20/22, but is similar  to echo from 08/10/22 (pre-TAVR).   Conclusion(s)/Recommendation(s): In atrial fibrillation with RVR  throughout study.   TEE 10/09/22: IMPRESSIONS     1. LV function appears severely depressed with global hypokinesis. . Left  ventricular ejection fraction, by estimation, is 25%. The left ventricle  has severely decreased function.   2. Right ventricular systolic function is severely reduced. The right  ventricular size is normal.   3. No left atrial/left atrial appendage thrombus was detected.   4. The mitral valve is normal in structure. Mild mitral valve  regurgitation.   5. S/p TAVR (26 mm Edwards S3UR; procedure date 08/19/22) Leaflets open  well . The aortic valve has been repaired/replaced. Aortic valve  regurgitation is not visualized. There is a 26 mm Sapien prosthetic (TAVR)  valve present in the aortic position.  Procedure Date: 08/19/22.   6. There is mild (Grade II) plaque.   Echo 03/05/23: IMPRESSIONS     1.  Left ventricular ejection fraction, by estimation, is 60 to 65%. Left  ventricular ejection fraction by 3D volume is 63 %. The left ventricle has  normal function. The left ventricle has no regional wall motion  abnormalities. There is mild left  ventricular hypertrophy. Left ventricular diastolic parameters were  grossly normal.   2. Right ventricular systolic function is normal. The right ventricular  size is normal. Tricuspid regurgitation signal is inadequate for assessing  PA pressure.   3. Left atrial size was mild to moderately dilated.    4. The mitral valve is degenerative. Trivial mitral valve regurgitation.  No evidence of mitral stenosis.   5. The aortic valve has been repaired/replaced. Aortic valve  regurgitation is not visualized. There is a 26 mm Sapien prosthetic (TAVR)  valve present in the aortic position. Procedure Date: 08/19/22. Echo  findings are consistent with normal structure  and function of the aortic valve prosthesis. Aortic valve area, by VTI  measures 2.22 cm. Aortic valve mean gradient measures 9.0 mmHg. Aortic  valve Vmax measures 2.01 m/s.   6. The inferior vena cava is normal in size with greater than 50%  respiratory variability, suggesting right atrial pressure of 3 mmHg.   Echo 08/26/23: IMPRESSIONS     1. Left ventricular ejection fraction, by estimation, is 60 to 65%. The  left ventricle has normal function. The left ventricle has no regional  wall motion abnormalities. There is mild left ventricular hypertrophy.  Left ventricular diastolic parameters  are indeterminate.   2. Right ventricular systolic function is normal. The right ventricular  size is normal. There is normal pulmonary artery systolic pressure.   3. Left atrial size was moderately dilated.   4. The mitral valve is degenerative. Trivial mitral valve regurgitation.   5. S/p 26 mm Edwards Sapien 3 Ultra Resilia. V max 1.8 m/s. mean gradient  7 mmHg. no significant PVL. Aortic valve regurgitation is not visualized.  Echo findings are consistent with normal structure and function of the  aortic valve prosthesis.   6. The inferior vena cava is normal in size with greater than 50%  respiratory variability, suggesting right atrial pressure of 3 mmHg.   Comparison(s): No significant change from prior study.   EKG Interpretation Date/Time:  Tuesday March 15 2024 15:43:56 EDT Ventricular Rate:  149 PR Interval:    QRS Duration:  88 QT Interval:  300 QTC Calculation: 472 R Axis:   118  Text Interpretation: Atrial  fibrillation with rapid ventricular response Right axis deviation Nonspecific T wave abnormality When compared with ECG of February 21, 2023 Atrial fibrillation has replaced Sinus rhythm Vent. rate has increased BY  96 BPM QRS axis Shifted right Nonspecific T wave abnormality, worse in Inferior leads Nonspecific T wave abnormality now evident in Lateral leads Confirmed by [place], Myron Lona [phone]) on 03/15/2024 4:56:22 PM    Recent Labs: 05/13/2023: ALT 18 02/21/2024: BUN 15; Creatinine, Ser 0.72; Hemoglobin 13.7; Platelets 149; Potassium 4.1; Sodium 137  Recent Lipid Panel    Component Value Date/Time   CHOL 232 (H) 07/03/2023 1740   CHOL 200 (H) 03/21/2013 1142   TRIG 90 07/03/2023 1740   TRIG 192 (H) 03/21/2013 1142   HDL 44 07/03/2023 1740   HDL 31 (L) 03/21/2013 1142   CHOLHDL 5.3 07/03/2023 1740   VLDL 18 07/03/2023 1740   LDLCALC 170 (H) 07/03/2023 1740   LDLCALC 131 (H) 03/21/2013 1142     Risk Assessment/Calculations:       Physical Exam:  VS:  BP (!) 80/54   Pulse (!) 50   Ht 5' 10 (1.778 m)   Wt 150 lb (68 kg)   SpO2 97%   BMI 21.52 kg/m     Wt Readings from Last 3 Encounters:  03/15/24 150 lb (68 kg)  02/21/24 152 lb (68.9 kg)  08/26/23 149 lb 9.6 oz (67.9 kg)     GEN:  Well nourished, well developed in no acute distress HEENT: Normal NECK: No JVD LYMPHATICS: No lymphadenopathy CARDIAC: irreg irreg, tachy.  no murmurs, rubs, gallops RESPIRATORY:  Clear to auscultation without rales, wheezing or rhonchi  ABDOMEN: Soft, non-tender, non-distended MUSCULOSKELETAL:  No edema; No deformity  SKIN: Warm and dry. NEUROLOGIC:  Alert and oriented x 3 PSYCHIATRIC:  Normal affect   ASSESSMENT:    1. PAF (paroxysmal atrial fibrillation) (HCC)   2. S/P TAVR (transcatheter aortic valve replacement)   3. Coronary artery disease involving native heart without angina pectoris, unspecified vessel or lesion type   4. CAD S/P percutaneous coronary angioplasty          PLAN:    In order of problems listed above:  Atrial fibrillation with RVR: s/p TEE guided DCCV in January 2024. Has maintained NSR until now. Now with RVR. Recommend increasing Toprol  XL to 25 mg bid. Stressed importance of taking Eliquis  bid without missed doses. I have made every effort to simplify his medications for compliance. Will arrange follow up in 3 weeks. If still in Afib will arrange DCCV  Severe AS s/p TAVR: valve stable. Last Echo in Dec.   HLD: continue ezetimibe  10mg  daily. LDL 170. He is statin intolerant (has been tried on at least 3 by previous PCP). Declines additional therapy.   CAD: underwent successful PCI with orbital atherectomy, PTCA, and placement of 3 overlapping drug-eluting stents from the proximal to mid LAD on 08/13/22. Will discontinue plavix  at this time since on Eliquis .  As above, statin intolerant.    Chronic Systolic CHF. EF significantly decreased on TEE to 25%. 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 Repeat Echo showed improvement to 60-65%  S/p right parietal CVA with subtherapeutic INR. Now on Eliquis   HTN. BP now very low due to medication and AFib. Will stop amlodipine . Will stop lasix . Otherwise medication as noted.   Signed, Krissa Utke [place], MD  03/15/2024 4:58 PM    [facility]

## 2024-03-12 NOTE — H&P (View-Only) (Signed)
 [facility]                                        Cardiology Office Note:    Date:  03/15/2024   ID:  Jonathan Romero, DOB 04/30/1946, MRN [id number]  PCP:  Marvine Rush, MD  [facility] Cardiologist:  Windel Keziah [place], MD  [facility] Electrophysiologist:  None   Referring MD: Marvine Rush, MD   Follow up Afib, CAD, s/p TAVR  History of Present Illness:    Jonathan Romero is a 78 y.o. male with a hx of HLD, SVT, CAD requiring LAD PCI/DES x3 (08/13/22), severe AS s/p TAVR (08/19/22) and afib with RVR who presents to clinic for follow up.   On 08/09/22 he had two episodes of exertional syncope episode. His wife called 911 and EMS found the patient in SVT with a reported HR of 280. He was then cardioverted into NSR. Upon arrival to the ED he was found to have a heart murmur and NSTEMI. Echo 08/10/22 revealed EF 40-45% with severe AS with a mean gradient of 45.0 mmHg, peak gradient of 67.72mmHg, and AVA 0.53 cm2. R/LHC was 08/11/22 showed a 95% calcified LAD lesion. He was evaluated by the multidisciplinary valve team and felt to not be a surgical candidate due to significant aortic calcification found on CTA. He ultimately underwent successful PCI with orbital atherectomy, PTCA, and placement of 3 overlapping drug-eluting stents from the proximal to mid LAD on 08/13/22. He was discharged home on 08/14/22 with plans to return on 08/19/22 for TAVR.  He underwent successful TAVR with a 26 mm Edwards Sapien 3 Ultra Resilia THV via the TF approach on 08/19/22. Post operative echo showed EF 55%, normally functioning TAVR with a mean gradient of 12 mmHg and no PVL.   He has done well in follow up with an improvement in his symptoms. He was seen on 09/24/22 for 1 month follow up. Echo showed EF 40-45%, normally functioning TAVR with a mean gradient of 8.8 mmHg and no PVL. He was noted to be in rapid afib, which was asymptomatic. Toprol  25mg  daily increased to BID. Started on Elquis  s/p  TEE guided DCCV on 10/09/22. He was seen in Afib clinic on 10/16/22 and was still in NSR at that time. Of note TEE showed significant decrease in LV function with EF 25%.   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 CTA demonstrated no large vessel occlusion, hemodynamically significant stenosis, or aneurysm in the head or neck. EKG and telemetry demonstrated NSR. CT head demonstrated no acute intracranial abnormality. MRI brain demonstrated small linear acute infarct in the right parietal lobe with no acute hemorrhage. Neurology did not recommend a TTE from a stroke standpoint as he recently had a TTE on 03/05/2023 with normal EF. His INR was subtherapeutic and coumadin  was adjusted. He was later transitioned to Eliquis .  LDL was 170 and it was recommended he start Leqvio. His insurance did cover it but patient declined use- did not like the risks.    Today the patient presents to clinic for follow up. Here is here with son. He was seen in the ED in early June with markedly elevated BP. Labs were OK. Ecg NSR. Did CT Abd that was negative. DC on amlodipine  5 mg daily. He was already on Toprol . He is very confused about his medication. He has  been taking metoprolol  once a day rather than prescribed twice a day.  He has a Rx for lasix  to use PRN swelling but he has been taking this if BP is > 90 systolic because this is how it is written on the bottle. He states if BP low he also holds his Eliquis . He denies any swelling. No palpitations.    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 Wonda & Weldner   Severe aortic stenosis     Past Surgical History:  Procedure Laterality Date   CARDIOVERSION N/A 10/09/2022   Procedure: CARDIOVERSION;  Surgeon: Okey Vina GAILS, MD;  Location: [facility] ENDOSCOPY;  Service: Cardiovascular;  Laterality: N/A;   CATARACT EXTRACTION     CORONARY ATHERECTOMY N/A 08/13/2022   Procedure: CORONARY ATHERECTOMY;  Surgeon: Wonda Sharper, MD;  Location: [facility] INVASIVE CV LAB;  Service: Cardiovascular;  Laterality: N/A;   CORONARY STENT INTERVENTION N/A 08/13/2022   Procedure: CORONARY STENT INTERVENTION;  Surgeon: Wonda Sharper, MD;  Location: [facility] INVASIVE CV LAB;  Service: Cardiovascular;  Laterality: N/A;   DOPPLER ECHOCARDIOGRAPHY  12/21/2012   EF 55-60%,   DOPPLER ECHOCARDIOGRAPHY  12/16/2011   MILD AORTIC STENOSIS,PEAK AND MEAN GRADIENTS OF 18 AND 8 mmHg and a valve area of around 2 square cm.    INTRAOPERATIVE TRANSTHORACIC ECHOCARDIOGRAM N/A 08/19/2022   Procedure: INTRAOPERATIVE TRANSTHORACIC ECHOCARDIOGRAM;  Surgeon: Wonda Sharper, MD;  Location: [facility] OR;  Service: Open Heart Surgery;  Laterality: N/A;   RIGHT/LEFT HEART CATH AND CORONARY ANGIOGRAPHY N/A 08/11/2022   Procedure: RIGHT/LEFT HEART CATH AND CORONARY ANGIOGRAPHY;  Surgeon: Claudene Victory ORN, MD;  Location: [facility] INVASIVE CV LAB;  Service: Cardiovascular;  Laterality: N/A;   TEE WITHOUT CARDIOVERSION N/A 10/09/2022   Procedure: TRANSESOPHAGEAL ECHOCARDIOGRAM (TEE);  Surgeon: Okey Vina GAILS, MD;  Location: [facility] ENDOSCOPY;  Service: Cardiovascular;  Laterality: N/A;   TRANSCATHETER AORTIC VALVE REPLACEMENT, TRANSFEMORAL N/A 08/19/2022   Procedure: Transcatheter Aortic Valve Replacement, Transfemoral using a 26 MM Edwards SAPIEN 3 Ultra;  Surgeon: Wonda Sharper, MD;  Location: [facility] OR;  Service: Open Heart Surgery;  Laterality: N/A;  Transfemoral    Current Medications: Current Meds  Medication Sig    ezetimibe  (ZETIA ) 10 MG tablet Take 1 tablet (10 mg total) by mouth daily.   hydrocortisone  cream 1 % Apply 1 Application topically daily as needed (hemorrhoids).   nitroGLYCERIN  (NITROSTAT ) 0.4 MG SL tablet Place 1 tablet (0.4 mg total) under the tongue every 5 (five) minutes x 3 doses as needed for chest pain.   [DISCONTINUED] amLODipine  (NORVASC ) 5 MG tablet Take 1 tablet (5 mg total) by mouth daily.   [DISCONTINUED] apixaban  (ELIQUIS ) 5 MG TABS tablet Take 1 tablet (5 mg total) by mouth 2 (two) times daily. (Patient taking differently: Take 1 tablet (5 mg total) by mouth 2 (two) times daily.)   [DISCONTINUED] clopidogrel  (PLAVIX ) 75 MG tablet Take 1 tablet (75 mg total) by mouth daily.   [DISCONTINUED]  furosemide  (LASIX ) 20 MG tablet Take 1 tablet (20 mg total) by mouth daily as needed for fluid or edema (if bottom bp number is above 80).   [DISCONTINUED] metoprolol  succinate (TOPROL -XL) 25 MG 24 hr tablet Take 1 tablet by mouth twice daily (Patient taking differently: Take 1 tablet by mouth twice daily)     Allergies:   Doxycycline  and Statins   Social History   Socioeconomic History   Marital status: Married    Spouse name: Henrita   Number of children: 3   Years of education: Not on file   Highest education level: High school graduate  Occupational History   Occupation: retired  Tobacco Use   Smoking status: Former    Current packs/day: 0.00    Types: Cigarettes    Quit date: 04/15/1983    Years since quitting: 40.9   Smokeless tobacco: Never   Tobacco comments:    Former smoker 10/16/22  Vaping Use   Vaping status: Never Used  Substance and Sexual Activity   Alcohol use: Yes    Comment: beer occasionally 10/16/22   Drug use: No   Sexual activity: Not on file  Other Topics Concern   Not on file  Social History Narrative   Not on file   Social Drivers of Health   Financial Resource Strain: Low Risk  (08/13/2022)   Overall Financial Resource Strain (CARDIA)     Difficulty of Paying Living Expenses: Not hard at all  Food Insecurity: No Food Insecurity (07/03/2023)   Hunger Vital Sign    Worried About Running Out of Food in the Last Year: Never true    Ran Out of Food in the Last Year: Never true  Transportation Needs: No Transportation Needs (07/03/2023)   PRAPARE - Administrator, Civil Service (Medical): No    Lack of Transportation (Non-Medical): No  Physical Activity: Not on file  Stress: Not on file  Social Connections: Not on file     Family History: The patient's family history includes Diabetes in his mother; Heart failure in his father.  ROS:   Please see the history of present illness.    All other systems reviewed and are negative.  EKGs/Labs/Other Studies Reviewed:    The following studies were reviewed today:  TAVR OPERATIVE NOTE     Date of Procedure:                08/19/2022   Preoperative Diagnosis:      Severe Aortic Stenosis    Postoperative Diagnosis:    Same    Procedure:        Transcatheter Aortic Valve Replacement - Percutaneous  Transfemoral Approach             Edwards Sapien 3 Ultra Resilia THV (size 26 mm, serial #[id number])              Co-Surgeons:                        Deward Kallman, MD and Ozell Fell, MD   Anesthesiologist:                  Zachary Ee, MD   Echocardiographer:              Jerel Balding, MD   Pre-operative Echo Findings: Severe aortic stenosis Normal left ventricular systolic function   Post-operative Echo Findings: No paravalvular leak Normal/unchanged left ventricular systolic function   ___________________________  Echo 08/20/22:  IMPRESSIONS  1. Left ventricular ejection fraction, by estimation, is 55 to 60%. The  left ventricle has normal function. The left ventricle demonstrates  regional wall motion abnormalities (see scoring diagram/findings for  description). Left ventricular diastolic  parameters are consistent with Grade II diastolic  dysfunction  (pseudonormalization). Elevated left atrial pressure. There is moderate  hypokinesis of the left ventricular, basal-mid inferolateral wall.   2. Right ventricular systolic function is normal. The right ventricular  size is normal. There is mildly elevated pulmonary artery systolic  pressure.   3. Left atrial size was moderately dilated.   4. The mitral valve is normal in structure. Trivial mitral valve  regurgitation. Moderate mitral annular calcification.   5. The aortic valve has been repaired/replaced. Aortic valve  regurgitation is not visualized. There is a valve present in the aortic  position. Echo findings are consistent with normal structure and function  of the aortic valve prosthesis. Aortic valve  mean gradient measures 12.0 mmHg. Aortic valve Vmax measures 2.39 m/s.  Aortic valve acceleration time measures 85 msec.   6. The inferior vena cava is dilated in size with <50% respiratory  variability, suggesting right atrial pressure of 15 mmHg.   Comparison(s): Prior images reviewed side by side. The left ventricular  function has improved.   ______________________  Echo 09/24/22 IMPRESSIONS   1. Left ventricular ejection fraction, by estimation, is 40 to 45%. The  left ventricle has mildly decreased function. The left ventricle  demonstrates global hypokinesis. Left ventricular diastolic function could  not be evaluated.   2. Right ventricular systolic function is normal. The right ventricular  size is normal. There is normal pulmonary artery systolic pressure.   3. Left atrial size was moderately dilated.   4. The mitral valve is normal in structure. Trivial mitral valve  regurgitation. No evidence of mitral stenosis.   5. The aortic valve has been repaired/replaced. Aortic valve  regurgitation is not visualized. There is a 26 mm Edwards Sapien  prosthetic (TAVR) valve present in the aortic position. Echo findings are  consistent with normal structure and  function of the  aortic valve prosthesis. Aortic valve area, by VTI measures 1.65 cm.  Aortic valve mean gradient measures 8.8 mmHg.   6. The inferior vena cava is normal in size with greater than 50%  respiratory variability, suggesting right atrial pressure of 3 mmHg.   Comparison(s): Prior images reviewed side by side. Changes from prior  study are noted. EF reduced compared to echo from 08/20/22, but is similar  to echo from 08/10/22 (pre-TAVR).   Conclusion(s)/Recommendation(s): In atrial fibrillation with RVR  throughout study.   TEE 10/09/22: IMPRESSIONS     1. LV function appears severely depressed with global hypokinesis. . Left  ventricular ejection fraction, by estimation, is 25%. The left ventricle  has severely decreased function.   2. Right ventricular systolic function is severely reduced. The right  ventricular size is normal.   3. No left atrial/left atrial appendage thrombus was detected.   4. The mitral valve is normal in structure. Mild mitral valve  regurgitation.   5. S/p TAVR (26 mm Edwards S3UR; procedure date 08/19/22) Leaflets open  well . The aortic valve has been repaired/replaced. Aortic valve  regurgitation is not visualized. There is a 26 mm Sapien prosthetic (TAVR)  valve present in the aortic position.  Procedure Date: 08/19/22.   6. There is mild (Grade II) plaque.   Echo 03/05/23: IMPRESSIONS     1.  Left ventricular ejection fraction, by estimation, is 60 to 65%. Left  ventricular ejection fraction by 3D volume is 63 %. The left ventricle has  normal function. The left ventricle has no regional wall motion  abnormalities. There is mild left  ventricular hypertrophy. Left ventricular diastolic parameters were  grossly normal.   2. Right ventricular systolic function is normal. The right ventricular  size is normal. Tricuspid regurgitation signal is inadequate for assessing  PA pressure.   3. Left atrial size was mild to moderately dilated.    4. The mitral valve is degenerative. Trivial mitral valve regurgitation.  No evidence of mitral stenosis.   5. The aortic valve has been repaired/replaced. Aortic valve  regurgitation is not visualized. There is a 26 mm Sapien prosthetic (TAVR)  valve present in the aortic position. Procedure Date: 08/19/22. Echo  findings are consistent with normal structure  and function of the aortic valve prosthesis. Aortic valve area, by VTI  measures 2.22 cm. Aortic valve mean gradient measures 9.0 mmHg. Aortic  valve Vmax measures 2.01 m/s.   6. The inferior vena cava is normal in size with greater than 50%  respiratory variability, suggesting right atrial pressure of 3 mmHg.   Echo 08/26/23: IMPRESSIONS     1. Left ventricular ejection fraction, by estimation, is 60 to 65%. The  left ventricle has normal function. The left ventricle has no regional  wall motion abnormalities. There is mild left ventricular hypertrophy.  Left ventricular diastolic parameters  are indeterminate.   2. Right ventricular systolic function is normal. The right ventricular  size is normal. There is normal pulmonary artery systolic pressure.   3. Left atrial size was moderately dilated.   4. The mitral valve is degenerative. Trivial mitral valve regurgitation.   5. S/p 26 mm Edwards Sapien 3 Ultra Resilia. V max 1.8 m/s. mean gradient  7 mmHg. no significant PVL. Aortic valve regurgitation is not visualized.  Echo findings are consistent with normal structure and function of the  aortic valve prosthesis.   6. The inferior vena cava is normal in size with greater than 50%  respiratory variability, suggesting right atrial pressure of 3 mmHg.   Comparison(s): No significant change from prior study.   EKG Interpretation Date/Time:  Tuesday March 15 2024 15:43:56 EDT Ventricular Rate:  149 PR Interval:    QRS Duration:  88 QT Interval:  300 QTC Calculation: 472 R Axis:   118  Text Interpretation: Atrial  fibrillation with rapid ventricular response Right axis deviation Nonspecific T wave abnormality When compared with ECG of February 21, 2023 Atrial fibrillation has replaced Sinus rhythm Vent. rate has increased BY  96 BPM QRS axis Shifted right Nonspecific T wave abnormality, worse in Inferior leads Nonspecific T wave abnormality now evident in Lateral leads Confirmed by [place], Aaryanna Hyden [id number]) on 03/15/2024 4:56:22 PM    Recent Labs: 05/13/2023: ALT 18 02/21/2024: BUN 15; Creatinine, Ser 0.72; Hemoglobin 13.7; Platelets 149; Potassium 4.1; Sodium 137  Recent Lipid Panel    Component Value Date/Time   CHOL 232 (H) 07/03/2023 1740   CHOL 200 (H) 03/21/2013 1142   TRIG 90 07/03/2023 1740   TRIG 192 (H) 03/21/2013 1142   HDL 44 07/03/2023 1740   HDL 31 (L) 03/21/2013 1142   CHOLHDL 5.3 07/03/2023 1740   VLDL 18 07/03/2023 1740   LDLCALC 170 (H) 07/03/2023 1740   LDLCALC 131 (H) 03/21/2013 1142     Risk Assessment/Calculations:       Physical Exam:  VS:  BP (!) 80/54   Pulse (!) 50   Ht 5' 10 (1.778 m)   Wt 150 lb (68 kg)   SpO2 97%   BMI 21.52 kg/m     Wt Readings from Last 3 Encounters:  03/15/24 150 lb (68 kg)  02/21/24 152 lb (68.9 kg)  08/26/23 149 lb 9.6 oz (67.9 kg)     GEN:  Well nourished, well developed in no acute distress HEENT: Normal NECK: No JVD LYMPHATICS: No lymphadenopathy CARDIAC: irreg irreg, tachy.  no murmurs, rubs, gallops RESPIRATORY:  Clear to auscultation without rales, wheezing or rhonchi  ABDOMEN: Soft, non-tender, non-distended MUSCULOSKELETAL:  No edema; No deformity  SKIN: Warm and dry. NEUROLOGIC:  Alert and oriented x 3 PSYCHIATRIC:  Normal affect   ASSESSMENT:    1. PAF (paroxysmal atrial fibrillation) (HCC)   2. S/P TAVR (transcatheter aortic valve replacement)   3. Coronary artery disease involving native heart without angina pectoris, unspecified vessel or lesion type   4. CAD S/P percutaneous coronary angioplasty          PLAN:    In order of problems listed above:  Atrial fibrillation with RVR: s/p TEE guided DCCV in January 2024. Has maintained NSR until now. Now with RVR. Recommend increasing Toprol  XL to 25 mg bid. Stressed importance of taking Eliquis  bid without missed doses. I have made every effort to simplify his medications for compliance. Will arrange follow up in 3 weeks. If still in Afib will arrange DCCV  Severe AS s/p TAVR: valve stable. Last Echo in Dec.   HLD: continue ezetimibe  10mg  daily. LDL 170. He is statin intolerant (has been tried on at least 3 by previous PCP). Declines additional therapy.   CAD: underwent successful PCI with orbital atherectomy, PTCA, and placement of 3 overlapping drug-eluting stents from the proximal to mid LAD on 08/13/22. Will discontinue plavix  at this time since on Eliquis .  As above, statin intolerant.    Chronic Systolic CHF. EF significantly decreased on TEE to 25%. 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 Repeat Echo showed improvement to 60-65%  S/p right parietal CVA with subtherapeutic INR. Now on Eliquis   HTN. BP now very low due to medication and AFib. Will stop amlodipine . Will stop lasix . Otherwise medication as noted.   Signed, Johara Lodwick [place], MD  03/15/2024 4:58 PM    [facility]

## 2024-03-15 ENCOUNTER — Other Ambulatory Visit (HOSPITAL_COMMUNITY): Payer: Self-pay

## 2024-03-15 ENCOUNTER — Ambulatory Visit: Attending: Cardiology | Admitting: Cardiology

## 2024-03-15 ENCOUNTER — Encounter: Payer: Self-pay | Admitting: Cardiology

## 2024-03-15 VITALS — BP 80/54 | HR 50 | Ht 70.0 in | Wt 150.0 lb

## 2024-03-15 DIAGNOSIS — I2510 Atherosclerotic heart disease of native coronary artery without angina pectoris: Secondary | ICD-10-CM | POA: Diagnosis not present

## 2024-03-15 DIAGNOSIS — I480 Paroxysmal atrial fibrillation: Secondary | ICD-10-CM | POA: Insufficient documentation

## 2024-03-15 DIAGNOSIS — Z9861 Coronary angioplasty status: Secondary | ICD-10-CM | POA: Insufficient documentation

## 2024-03-15 DIAGNOSIS — Z952 Presence of prosthetic heart valve: Secondary | ICD-10-CM | POA: Insufficient documentation

## 2024-03-15 MED ORDER — METOPROLOL SUCCINATE ER 25 MG PO TB24
25.0000 mg | ORAL_TABLET | Freq: Two times a day (BID) | ORAL | 3 refills | Status: AC
  Filled 2024-03-15: qty 180, 90d supply, fill #0

## 2024-03-15 MED ORDER — APIXABAN 5 MG PO TABS
5.0000 mg | ORAL_TABLET | Freq: Two times a day (BID) | ORAL | 1 refills | Status: DC
  Filled 2024-03-15: qty 180, 90d supply, fill #0

## 2024-03-15 NOTE — Patient Instructions (Signed)
 Medication Instructions:  Stop taking Plavix  Stop taking Lasix  Stop taking Amlodipine  Take all other medications  *If you need a refill on your cardiac medications before your next appointment, please call your pharmacy*  Lab Work: None ordered  Testing/Procedures: None ordered   Follow-Up: At [facility], you and your health needs are our priority.  As part of our continuing mission to provide you with exceptional heart care, our providers are all part of one team.  This team includes your primary Cardiologist (physician) and Advanced Practice Providers or APPs (Physician Assistants and Nurse Practitioners) who all work together to provide you with the care you need, when you need it.  Your next appointment:  Tuesday 7/15 at 1:55 pm    Provider:  Damien Braver NP   We recommend signing up for the patient portal called MyChart.  Sign up information is provided on this After Visit Summary.  MyChart is used to connect with patients for Virtual Visits (Telemedicine).  Patients are able to view lab/test results, encounter notes, upcoming appointments, etc.  Non-urgent messages can be sent to your provider as well.   To learn more about what you can do with MyChart, go to [email or web address].

## 2024-03-24 ENCOUNTER — Other Ambulatory Visit (HOSPITAL_COMMUNITY): Payer: Self-pay

## 2024-04-05 ENCOUNTER — Encounter: Payer: Self-pay | Admitting: Nurse Practitioner

## 2024-04-05 ENCOUNTER — Ambulatory Visit: Attending: Cardiology | Admitting: Nurse Practitioner

## 2024-04-05 VITALS — BP 94/62 | HR 147 | Ht 70.0 in | Wt 162.4 lb

## 2024-04-05 DIAGNOSIS — I480 Paroxysmal atrial fibrillation: Secondary | ICD-10-CM | POA: Insufficient documentation

## 2024-04-05 DIAGNOSIS — I5032 Chronic diastolic (congestive) heart failure: Secondary | ICD-10-CM | POA: Diagnosis present

## 2024-04-05 DIAGNOSIS — Z952 Presence of prosthetic heart valve: Secondary | ICD-10-CM | POA: Diagnosis present

## 2024-04-05 DIAGNOSIS — I2510 Atherosclerotic heart disease of native coronary artery without angina pectoris: Secondary | ICD-10-CM | POA: Insufficient documentation

## 2024-04-05 DIAGNOSIS — E785 Hyperlipidemia, unspecified: Secondary | ICD-10-CM | POA: Diagnosis not present

## 2024-04-05 DIAGNOSIS — I350 Nonrheumatic aortic (valve) stenosis: Secondary | ICD-10-CM | POA: Insufficient documentation

## 2024-04-05 DIAGNOSIS — Z8673 Personal history of transient ischemic attack (TIA), and cerebral infarction without residual deficits: Secondary | ICD-10-CM | POA: Insufficient documentation

## 2024-04-05 DIAGNOSIS — I10 Essential (primary) hypertension: Secondary | ICD-10-CM | POA: Diagnosis not present

## 2024-04-05 MED ORDER — FUROSEMIDE 20 MG PO TABS: ORAL_TABLET | ORAL | 3 refills | Status: AC

## 2024-04-05 NOTE — Progress Notes (Unsigned)
 Office Visit    Patient Name: Jonathan Romero Date of Encounter: 04/05/2024  Primary Care Provider:  Marvine Rush, MD Primary Cardiologist:  Peter Swaziland, MD  Chief Complaint    78 year old male with a history of CAD s/p DES x 3 overlapping proximal to mid LAD in 2023, severe aortic stenosis s/p TAVR in 07/2022, paroxysmal atrial fibrillation, PSVT, cardiomyopathy with improved EF, CVA, hypertension and hyperlipidemia who presents for follow-up related to CAD and atrial fibrillation.  Past Medical History    Past Medical History:  Diagnosis Date   Arthritis    Cellulitis    Coronary artery disease    s/p 3 overlapping DES from proximal-mid LAD 08/13/22   Dyslipidemia    GERD (gastroesophageal reflux disease)    Hyperlipidemia 09/23/2011   LDL particle number [id number] with LDL CALULATED at 151 and 2300 in 1988 was the small LDL particle number,which is very high- not tinterested in taking medications   Myocardial infarction (HCC)    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 Wonda & Weldner   Severe aortic stenosis    Past Surgical History:  Procedure Laterality Date   CARDIOVERSION N/A 10/09/2022   Procedure: CARDIOVERSION;  Surgeon: Okey Vina GAILS, MD;  Location: [facility] ENDOSCOPY;  Service: Cardiovascular;  Laterality: N/A;   CATARACT EXTRACTION     CORONARY ATHERECTOMY N/A 08/13/2022   Procedure: CORONARY ATHERECTOMY;  Surgeon: Wonda Sharper, MD;  Location: [facility] INVASIVE CV LAB;  Service: Cardiovascular;  Laterality: N/A;   CORONARY STENT INTERVENTION N/A 08/13/2022   Procedure: CORONARY STENT INTERVENTION;  Surgeon: Wonda Sharper, MD;  Location: [facility] INVASIVE CV LAB;  Service: Cardiovascular;  Laterality: N/A;   DOPPLER ECHOCARDIOGRAPHY  12/21/2012   EF 55-60%,   DOPPLER ECHOCARDIOGRAPHY  12/16/2011   MILD AORTIC STENOSIS,PEAK AND MEAN GRADIENTS OF 18 AND 8 mmHg and a valve area of around 2 square  cm.    INTRAOPERATIVE TRANSTHORACIC ECHOCARDIOGRAM N/A 08/19/2022   Procedure: INTRAOPERATIVE TRANSTHORACIC ECHOCARDIOGRAM;  Surgeon: Wonda Sharper, MD;  Location: [facility] OR;  Service: Open Heart Surgery;  Laterality: N/A;   RIGHT/LEFT HEART CATH AND CORONARY ANGIOGRAPHY N/A 08/11/2022   Procedure: RIGHT/LEFT HEART CATH AND CORONARY ANGIOGRAPHY;  Surgeon: Claudene Victory LELON, MD;  Location: [facility] INVASIVE CV LAB;  Service: Cardiovascular;  Laterality: N/A;   TEE WITHOUT CARDIOVERSION N/A 10/09/2022   Procedure: TRANSESOPHAGEAL ECHOCARDIOGRAM (TEE);  Surgeon: Okey Vina GAILS, MD;  Location: [facility] ENDOSCOPY;  Service: Cardiovascular;  Laterality: N/A;   TRANSCATHETER AORTIC VALVE REPLACEMENT, TRANSFEMORAL N/A 08/19/2022   Procedure: Transcatheter Aortic Valve Replacement, Transfemoral using a 26 MM Edwards SAPIEN 3 Ultra;  Surgeon: Wonda Sharper, MD;  Location: [facility] OR;  Service: Open Heart Surgery;  Laterality: N/A;  Transfemoral    Allergies  Allergies  Allergen Reactions   Doxycycline  Other (See Comments)    Joint pain   Statins     Joint pain     Labs/Other Studies Reviewed    The following studies were reviewed today: *** Cardiac Studies & Procedures   ______________________________________________________________________________________________ CARDIAC CATHETERIZATION  CARDIAC CATHETERIZATION 08/13/2022  Conclusion   Mid LAD lesion is 70% stenosed.   Prox LAD to Mid LAD lesion is 95% stenosed.   Prox RCA lesion is 75% stenosed.   1st Sept lesion is 70% stenosed.   A drug-eluting stent was successfully placed using a SYNERGY XD 3.50X16.   A drug-eluting stent was successfully placed using a  SYNERGY XD [id number].   Post intervention, there is a 0% residual stenosis.   Post intervention, there is a 0% residual stenosis.  Successful PCI with orbital atherectomy, PTCA, and placement of 3 overlapping drug-eluting stents from the proximal to mid LAD guided by OCT imaging (3.5 x 16 mm Synergy DES  proximally, 3.0 x 20 mm Synergy DES mid vessel, 2.75 x 12 mm Synergy DES distally)  Recommendations: Aspirin  and clopidogrel  without interruption x12 months.  Plan to proceed with TAVR next week.  Findings Coronary Findings Diagnostic  Dominance: Left  Left Anterior Descending There is moderate diffuse disease throughout the vessel. Prox LAD to Mid LAD lesion is 95% stenosed. Mid LAD lesion is 70% stenosed.  First Septal Branch 1st Sept lesion is 70% stenosed.  Third Diagonal Branch Collaterals 3rd Diag filled by collaterals from [employer].  Right Coronary Artery Prox RCA lesion is 75% stenosed.  Intervention  Prox LAD to Mid LAD lesion Stent A drug-eluting stent was successfully placed using a SYNERGY XD 3.50X16. Post-stent angioplasty was performed using a BALLN [place] EMERGE MR [id number]. Maximum pressure:  20 atm. Orbital atherectomy is performed.  Following orbital atherectomy, both lesions in the proximal and mid vessel are predilated with a 2.5 x 12 mm balloon.  Both lesions appear to expand well with balloon angioplasty.  The distal lesion is stented with a 2.75 x 12 mm Synergy DES.  The proximal lesion is stented with a 3.5 x 16 mm Synergy DES.  Because of proximal edge dissection of the most distal stent, the intervening segment is stented in overlapping fashion with a 3.0 x 20 mm Synergy DES.  All stents are overlapped.  The distal 2 stents are postdilated with a 3.25 mm noncompliant balloon to 20 atm.  The proximal stent is postdilated with a 3.75 mm noncompliant balloon to 20 atm.  Final OCT imaging demonstrates good stent expansion throughout with 0% residual stenosis and good apposition of the stented segment. There is no evidence of proximal or distal edge dissection based on OCT criteria. Post-Intervention Lesion Assessment The intervention was successful. Pre-interventional TIMI flow is 3. Post-intervention TIMI flow is 3. No complications occurred at this lesion. There is a  0% residual stenosis post intervention.  Mid LAD lesion Stent A drug-eluting stent was successfully placed using a SYNERGY XD 2.75X12. Post-stent angioplasty was performed using a BALL SAPPHIRE NC24 3.25X15. Maximum pressure:  20 atm. Post-Intervention Lesion Assessment The intervention was successful. Pre-interventional TIMI flow is 3. Post-intervention TIMI flow is 3. No complications occurred at this lesion. There is a 0% residual stenosis post intervention.   CARDIAC CATHETERIZATION 08/11/2022  Conclusion CONCLUSIONS: Severe calcific aortic stenosis with calculated aortic valve area less than 0.75 cm. 95% proximal to mid LAD followed by 70% further down the vessel.  The proximal lesion is within a calcified segment. 70 to 80% mid nondominant RCA which supplies faint collaterals to the LAD. Mild pulmonary hypertension.  Capillary wedge pressure 15 mmHg.  RECOMMENDATIONS: Surgical aortic valve replacement and LIMA to LAD and possibly the acute marginal branch of the RCA.  Findings Coronary Findings Diagnostic  Dominance: Left  Left Anterior Descending There is moderate diffuse disease throughout the vessel. Prox LAD to Mid LAD lesion is 95% stenosed. Mid LAD lesion is 70% stenosed.  First Septal Branch 1st Sept lesion is 70% stenosed.  Third Diagonal Branch Collaterals 3rd Diag filled by collaterals from [employer].  Right Coronary Artery Prox RCA lesion is 75% stenosed.  Intervention  No interventions have been documented.     ECHOCARDIOGRAM  ECHOCARDIOGRAM COMPLETE 08/26/2023  Narrative ECHOCARDIOGRAM REPORT    Patient Name:   DUVAN MOUSEL Date of Exam: 08/26/2023 Medical Rec #:  [id number]      Height:       70.0 in Accession #:    [id number]     Weight:       146.6 lb Date of Birth:  1946-08-04      BSA:          1.829 m Patient Age:    77 years       BP:           132/86 mmHg Patient Gender: M              HR:           65 bpm. Exam Location:   [address]  Procedure: 2D Echo, Cardiac Doppler and Color Doppler  Indications:    Z95.2 S/p TAVR  History:        Patient has prior history of Echocardiogram examinations, most recent 03/05/2023. CAD, Stroke, S/p TAVR (26mm Edwards Sapien 3 Ultra Resilia), Arrythmias:Atrial Fibrillation; Risk Factors:Former Smoker and Dyslipidemia.  Sonographer:    Elsie Bohr RDCS Referring Phys: [id number] [facility] R THOMPSON   Sonographer Comments: Technically difficult study due to poor echo windows. Image acquisition challenging due to patient body habitus and Image acquisition challenging due to respiratory motion. IMPRESSIONS   1. Left ventricular ejection fraction, by estimation, is 60 to 65%. The left ventricle has normal function. The left ventricle has no regional wall motion abnormalities. There is mild left ventricular hypertrophy. Left ventricular diastolic parameters are indeterminate. 2. Right ventricular systolic function is normal. The right ventricular size is normal. There is normal pulmonary artery systolic pressure. 3. Left atrial size was moderately dilated. 4. The mitral valve is degenerative. Trivial mitral valve regurgitation. 5. S/p 26 mm Edwards Sapien 3 Ultra Resilia. V max 1.8 m/s. mean gradient 7 mmHg. no significant PVL. Aortic valve regurgitation is not visualized. Echo findings are consistent with normal structure and function of the aortic valve prosthesis. 6. The inferior vena cava is normal in size with greater than 50% respiratory variability, suggesting right atrial pressure of 3 mmHg.  Comparison(s): No significant change from prior study.  FINDINGS Left Ventricle: Left ventricular ejection fraction, by estimation, is 60 to 65%. The left ventricle has normal function. The left ventricle has no regional wall motion abnormalities. The left ventricular internal cavity size was normal in size. There is mild left ventricular hypertrophy. Left ventricular diastolic  parameters are indeterminate.  Right Ventricle: The right ventricular size is normal. Right ventricular systolic function is normal. There is normal pulmonary artery systolic pressure. The tricuspid regurgitant velocity is 2.71 m/s, and with an assumed right atrial pressure of 3 mmHg, the estimated right ventricular systolic pressure is 32.4 mmHg.  Left Atrium: Left atrial size was moderately dilated.  Right Atrium: Right atrial size was normal in size.  Pericardium: There is no evidence of pericardial effusion.  Mitral Valve: The mitral valve is degenerative in appearance. Trivial mitral valve regurgitation.  Tricuspid Valve: Tricuspid valve regurgitation is mild.  Aortic Valve: S/p 26 mm Edwards Sapien 3 Ultra Resilia. V max 1.8 m/s. mean gradient 7 mmHg. no significant PVL. Aortic valve regurgitation is not visualized. Aortic valve mean gradient measures 7.0 mmHg. Aortic valve peak gradient measures 13.4 mmHg. Aortic valve area, by VTI measures 1.43 cm. Echo findings are consistent  with normal structure and function of the aortic valve prosthesis.  Pulmonic Valve: Pulmonic valve regurgitation is not visualized.  Aorta: The aortic root and ascending aorta are structurally normal, with no evidence of dilitation.  Venous: The inferior vena cava is normal in size with greater than 50% respiratory variability, suggesting right atrial pressure of 3 mmHg.  IAS/Shunts: No atrial level shunt detected by color flow Doppler.   LEFT VENTRICLE PLAX 2D LVIDd:         4.30 cm   Diastology LVIDs:         2.80 cm   LV e' medial:    8.85 cm/s LV PW:         1.00 cm   LV E/e' medial:  14.6 LV IVS:        1.00 cm   LV e' lateral:   11.77 cm/s LVOT diam:     2.00 cm   LV E/e' lateral: 10.9 LV SV:         59 LV SV Index:   32 LVOT Area:     3.14 cm   RIGHT VENTRICLE            IVC RVSP:           32.4 mmHg  IVC diam: 1.50 cm  LEFT ATRIUM             Index        RIGHT ATRIUM            Index LA diam:        3.10 cm 1.69 cm/m   RA Pressure: 3.00 mmHg LA Vol (A2C):   80.4 ml 43.95 ml/m  RA Area:     15.40 cm LA Vol (A4C):   79.0 ml 43.19 ml/m  RA Volume:   46.10 ml  25.20 ml/m LA Biplane Vol: 84.9 ml 46.41 ml/m AORTIC VALVE AV Area (Vmax):    1.37 cm AV Area (Vmean):   1.42 cm AV Area (VTI):     1.43 cm AV Vmax:           182.80 cm/s AV Vmean:          121.800 cm/s AV VTI:            0.410 m AV Peak Grad:      13.4 mmHg AV Mean Grad:      7.0 mmHg LVOT Vmax:         79.48 cm/s LVOT Vmean:        54.980 cm/s LVOT VTI:          0.187 m LVOT/AV VTI ratio: 0.46  AORTA Ao Root diam: 2.80 cm Ao Asc diam:  3.30 cm  MITRAL VALVE                TRICUSPID VALVE MV Area (PHT): 2.90 cm     TR Peak grad:   29.4 mmHg MV Decel Time: 261 msec     TR Vmax:        271.00 cm/s MV E velocity: 128.80 cm/s  Estimated RAP:  3.00 mmHg MV A velocity: 77.12 cm/s   RVSP:           32.4 mmHg MV E/A ratio:  1.67 SHUNTS Systemic VTI:  0.19 m Systemic Diam: 2.00 cm  Ronal Ross Electronically signed by Ronal Ross Signature Date/Time: 08/26/2023/3:18:55 PM    Final   TEE  ECHO TEE 10/09/2022  Narrative TRANSESOPHOGEAL ECHO REPORT    Patient Name:  Christopher LELON Costa Date of Exam: 10/09/2022 Medical Rec #:  [id number]      Height:       70.0 in Accession #:    [id number]     Weight:       149.5 lb Date of Birth:  Aug 09, 1946      BSA:          1.844 m Patient Age:    76 years       BP:           107/74 mmHg Patient Gender: M              HR:           135 bpm. Exam Location:  Inpatient  Procedure: Transesophageal Echo, Color Doppler and Cardiac Doppler  Indications:     I48.91* Unspeicified atrial fibrillation  History:         Patient has prior history of Echocardiogram examinations, most recent 09/25/2022. CAD and Previous Myocardial Infarction, Aortic Valve Disease; Risk Factors:Dyslipidemia and GERD. Aortic Valve: 26 mm Sapien prosthetic, stented (TAVR) valve  is present in the aortic position. Procedure Date: 08/19/22.  Sonographer:     Lauraine Pilot RDCS Referring Phys:  VINA GAILS ROSS Diagnosing Phys: VINA Gull MD  PROCEDURE: After discussion of the risks and benefits of a TEE, an informed consent was obtained from the patient. The transesophogeal probe was passed without difficulty through the esophogus of the patient. Sedation performed by different physician. The patient was monitored while under deep sedation. Anesthestetic sedation was provided intravenously by Anesthesiology: 92.38mg  of Propofol . The patient's vital signs; including heart rate, blood pressure, and oxygen saturation; remained stable throughout the procedure. The patient developed no complications during the procedure.  IMPRESSIONS   1. LV function appears severely depressed with global hypokinesis. . Left ventricular ejection fraction, by estimation, is 25%. The left ventricle has severely decreased function. 2. Right ventricular systolic function is severely reduced. The right ventricular size is normal. 3. No left atrial/left atrial appendage thrombus was detected. 4. The mitral valve is normal in structure. Mild mitral valve regurgitation. 5. S/p TAVR (26 mm Edwards S3UR; procedure date 08/19/22) Leaflets open well . The aortic valve has been repaired/replaced. Aortic valve regurgitation is not visualized. There is a 26 mm Sapien prosthetic (TAVR) valve present in the aortic position. Procedure Date: 08/19/22. 6. There is mild (Grade II) plaque.  FINDINGS Left Ventricle: LV function appears severely depressed with global hypokinesis. Left ventricular ejection fraction, by estimation, is 25%. The left ventricle has severely decreased function. The left ventricular internal cavity size was normal in size.  Right Ventricle: The right ventricular size is normal. Right ventricular systolic function is severely reduced.  Left Atrium: No left atrial/left atrial appendage  thrombus was detected.  Pericardium: Trivial pericardial effusion is present.  Mitral Valve: The mitral valve is normal in structure. Mild mitral valve regurgitation.  Tricuspid Valve: The tricuspid valve is normal in structure. Tricuspid valve regurgitation is mild.  Aortic Valve: S/p TAVR (26 mm Edwards S3UR; procedure date 08/19/22) Leaflets open well. The aortic valve has been repaired/replaced. Aortic valve regurgitation is not visualized. There is a 26 mm Sapien prosthetic, stented (TAVR) valve present in the aortic position. Procedure Date: 08/19/22.  Pulmonic Valve: The pulmonic valve was normal in structure. Pulmonic valve regurgitation is not visualized.  Aorta: The aortic root is normal in size and structure. There is mild (Grade II) plaque.  Additional Comments: Spectral Doppler performed.  VINA Gull MD  Electronically signed by Vina Gull MD Signature Date/Time: 10/09/2022/5:18:13 PM    Final    CT SCANS  CT CORONARY MORPH W/CTA COR W/SCORE 08/12/2022  Addendum 08/12/2022  5:24 PM ADDENDUM REPORT: 08/12/2022 15:24  CLINICAL DATA:  Aortic Valve pathology with assessment for TAVR  EXAM: Cardiac TAVR CT  TECHNIQUE: The patient was scanned on a Siemens Force 192 slice scanner. A 120 kV retrospective scan was triggered in the descending thoracic aorta at 111 HU's. Gantry rotation speed was 270 msecs and collimation was .9 mm. No beta blockade or nitro were given. The 3D data set was reconstructed in 5% intervals of the R-R cycle. Systolic and diastolic phases were analyzed on a dedicated work station using MPR, MIP and VRT modes. The patient received 95 cc of contrast.  FINDINGS: Aortic Valve: Severely thickened tri-leaflet aortic valve with heavy calcification and reduced excursion the planimeter valve area is 0.924 Sq cm consistent with severe aortic stenosis  Number of leaflets: 3  LVOT calcification: Presence of LVOT calcification into  the intervalvular fibrosa  Annular calcification: Severe due to presence of LVOT calcification, moderate otherwise  Aortic Valve Calcium Score: 2749  Presence of basal septal hypertrophy: Non severe  Perimembranous septal diameter: 7 mm  Mitral Valve: No significant mitral annular calcification  Aortic Annulus Measurements- 30% phase  Major annulus diameter: 29 mm  Minor annulus diameter: 21 mm  Annular perimeter: 79 mm  Annular area: 4.67 cm2  Aortic Root Measurements- 70% phase  Sinotubular Junction: 30 mm  Ascending Thoracic Aorta: 36 mm  Aortic Arch: 28 mm  Descending Thoracic Aorta: 25 mm  Aortic atherosclerosis.  Sinus of Valsalva Measurements:  Right coronary cusp width: 29 mm  Left coronary cusp width: 29 mm  Non coronary cusp width: 28 mm  Coronary Artery Height above Annulus:  Left Main: 13 mm  Left SoV height: 20 mm  Right Coronary: 14 mm  Right SoV height: 20 mm  Optimum Fluoroscopic Angle for Delivery: LAO 5, CAU 8  Cusp overlay view angle: RAO 0 CAU 11  Valves for structural team consideration:  26 mm Sapien Valve  29 mm Evolut  Non TAVR Valve Findings:  Coronary Arteries: Normal coronary origin. Study not completed with nitroglycerin .  Coronary Calcium Score:  Left main: 504  Left anterior descending artery: 951  Left circumflex artery: 237  Right coronary artery: 34  Total: 1726  Percentile: 83rd for age, sex, and race matched control.  Systemic veins: Normal anatomy  Main Pulmonary artery: Normal caliber  Pulmonary veins: Normal anatomy  Left atrial appendage: Patent  Interatrial septum: No communications  Left ventricle: Normal size  Left atrium: Mild dilation  Right ventricle: Normal size  Right atrium: Mild dilation  Pericardium: Trivial anterior pericardial effusion  Extra Cardiac Findings as per separate reporting.  IMPRESSION: 1. Severe Aortic stenosis. Findings pertinent to TAVR procedure  are detailed above.  RECOMMENDATIONS:  The proposed cut-off value of 1,651 AU yielded a 93 % sensitivity and 75 % specificity in grading AS severity in patients with classical low-flow, low-gradient AS. Proposed different cut-off values to define severe AS for men and women as 2,065 AU and 1,274 AU, respectively. The joint European and American recommendations for the assessment of AS consider the aortic valve calcium score as a continuum - a very high calcium score suggests severe AS and a low calcium score suggests severe AS is unlikely.  Donney VEAR Jarome LULLA Stephen RENETTE, et al. 2017 ESC/EACTS Guidelines for the management of  valvular heart disease. Eur Heart J [phone]  Coronary artery calcium (CAC) score is a strong predictor of incident coronary heart disease (CHD) and provides predictive information beyond traditional risk factors. CAC scoring is reasonable to use in the decision to withhold, postpone, or initiate statin therapy in intermediate-risk or selected borderline-risk asymptomatic adults (age 81-75 years and LDL-C >=70 to <190 mg/dL) who do not have diabetes or established atherosclerotic cardiovascular disease (ASCVD).* In intermediate-risk (10-year ASCVD risk >=7.5% to <20%) adults or selected borderline-risk (10-year ASCVD risk >=5% to <7.5%) adults in whom a CAC score is measured for the purpose of making a treatment decision the following recommendations have been made:  If CAC = 0, it is reasonable to withhold statin therapy and reassess in 5 to 10 years, as long as higher risk conditions are absent (diabetes mellitus, family history of premature CHD in first degree relatives (males <55 years; females <65 years), cigarette smoking, LDL >=190 mg/dL or other independent risk factors).  If CAC is 1 to 99, it is reasonable to initiate statin therapy for patients >=64 years of age.  If CAC is >=100 or >=75th percentile, it is reasonable to initiate statin  therapy at any age.  Cardiology referral should be considered for patients with CAC scores >=400 or >=75th percentile.  *2018 AHA/ACC/AACVPR/AAPA/ABC/ACPM/ADA/AGS/APhA/ASPC/NLA/PCNA Guideline on the Management of Blood Cholesterol: A Report of the American College of Cardiology/American Heart Association Task Force on Clinical Practice Guidelines. J Am Coll Cardiol. 2019;73(24):3168-3209.  Mahesh  Chandrasekhar   Electronically Signed By: Stanly Leavens M.D. On: 08/12/2022 15:24  Narrative EXAM: OVER-READ INTERPRETATION  CT CHEST  The following report is a limited chest CT over-read performed by radiologist Dr. Rea Marc of [facility] on 08/12/2022. This over-read does not include interpretation of cardiac or coronary anatomy or pathology. The cardiac TAVR interpretation by the cardiologist is attached.  COMPARISON:  None Available.  FINDINGS: Extracardiac findings will be described separately under dictation for contemporaneously obtained CTA chest, abdomen and pelvis.  IMPRESSION: Please see separate dictation for contemporaneously obtained CTA chest, abdomen and pelvis dated 08/12/2022 for full description of relevant extracardiac findings.  Electronically Signed: By: Rea Marc M.D. On: 08/12/2022 14:00     ______________________________________________________________________________________________     Recent Labs: 05/13/2023: ALT 18 02/21/2024: BUN 15; Creatinine, Ser 0.72; Hemoglobin 13.7; Platelets 149; Potassium 4.1; Sodium 137  Recent Lipid Panel    Component Value Date/Time   CHOL 232 (H) 07/03/2023 1740   CHOL 200 (H) 03/21/2013 1142   TRIG 90 07/03/2023 1740   TRIG 192 (H) 03/21/2013 1142   HDL 44 07/03/2023 1740   HDL 31 (L) 03/21/2013 1142   CHOLHDL 5.3 07/03/2023 1740   VLDL 18 07/03/2023 1740   LDLCALC 170 (H) 07/03/2023 1740   LDLCALC 131 (H) 03/21/2013 1142    History of Present Illness     78 year old male with the above past medical history including CAD s/p DES x 3 overlapping proximal to mid LAD in 2023, severe aortic stenosis s/p TAVR in 07/2022, paroxysmal atrial fibrillation, PSVT, cardiomyopathy with improved EF, CVA, hypertension and hyperlipidemia.  He was hospitalized in November 2023 in the setting of syncope, NSTEMI.  Echocardiogram revealed EF 40 to 45%, severe AS mean gradient 45 mmHg.  He underwent R/LHC which revealed 95% present calcified LAD lesion.  He underwent successful PCI, DES x 3 overlapping-proximal to mid LAD.  He underwent successful TAVR with a 26 mm Edwards SAPIEN 3 ultra Resilia THV on 08/19/2022.  He has a history of paroxysmal atrial fibrillation.  He underwent TEE guided DCCV in 09/2022.  He was hospitalized in October 2024 in the setting of CVA in the setting of subtherapeutic INR.  He was later transitioned to Eliquis .  Additionally, he has a history of GERD.  Most recent echocardiogram in 08/2023 showed EF 60 to 65%, normal LV function, no RWMA, mild LVH, normal RV systolic function, stable aortic valve prosthesis, mean gradient 7 mmHg.  He was last seen in the office on 03/15/2024 and was noted to be in atrial fibrillation with RVR.  Metoprolol  was increased to 25 g twice daily.  He reported intermittent adherence to Eliquis .  He was advised to take his Eliquis  without interruption.  It was noted that should he remain in atrial fibrillation at follow-up, outpatient DCCV should be considered.  Amlodipine  and Lasix  were discontinued in the setting of hypotension.  He presents today for follow-up.  Since his last visit Accompanied by his son.  He has had some increased shortness of breath with activity, increased bilateral lower extremity edema.  Lasix  was discontinued at last office visit.  He denies chest pain, dizziness, PND, orthopnea.  Will resume Lasix  20 mg daily, hold for SBP less than 90 mmHg.  Will check CBC, BMET, TSH, magnesium .  Will have him  follow-up with A-fib clinic 3 weeks post DCCV.  Encouraged ongoing adherence to Eliquis .  Follow-up in 3 months with Dr. Swaziland. Paroxysmal atrial fibrillation:  Informed Consent   Shared Decision Making/Informed Consent{ All outpatient stress tests require an informed consent ([id number]) ATTESTATION ORDER       :[id number]} The risks (stroke, cardiac arrhythmias rarely resulting in the need for a temporary or permanent pacemaker, skin irritation or burns and complications associated with conscious sedation including aspiration, arrhythmia, respiratory failure and death), benefits (restoration of normal sinus rhythm) and alternatives of a direct current cardioversion were explained in detail to Mr. Caperton and he agrees to proceed.       CAD: Aortic stenosis: Most recent echocardiogram in 08/2023 showed EF 60 to 65%, normal LV function, no RWMA, mild LVH, normal RV systolic function, stable aortic valve prosthesis, mean gradient 7 mmHg.   Heart failure with improved EF:  History of CVA:  Hypertension/hypotension: BP well controlled. Continue current antihypertensive regimen.  Hyperlipidemia: LDL was  Disposition:  Home Medications    Current Outpatient Medications  Medication Sig Dispense Refill   apixaban  (ELIQUIS ) 5 MG TABS tablet Take 1 tablet (5 mg total) by mouth 2 (two) times daily. 180 tablet 1   ezetimibe  (ZETIA ) 10 MG tablet Take 1 tablet (10 mg total) by mouth daily. 90 tablet 3   hydrocortisone  cream 1 % Apply 1 Application topically daily as needed (hemorrhoids).     metoprolol  succinate (TOPROL -XL) 25 MG 24 hr tablet Take 1 tablet (25 mg total) by mouth in the morning and at bedtime. 180 tablet 3   nitroGLYCERIN  (NITROSTAT ) 0.4 MG SL tablet Place 1 tablet (0.4 mg total) under the tongue every 5 (five) minutes x 3 doses as needed for chest pain. 25 tablet 2   No current facility-administered medications for this visit.     Review of Systems    ***.  All other systems reviewed  and are otherwise negative except as noted above.    Physical Exam    VS:  BP 94/62   Pulse (!) 147   Ht 5' 10 (1.778 m)   Wt 162 lb 6.4 oz (73.7 kg)  SpO2 97%   BMI 23.30 kg/m   GEN: Well nourished, well developed, in no acute distress. HEENT: normal. Neck: Supple, no JVD, carotid bruits, or masses. Cardiac: RRR, no murmurs, rubs, or gallops. No clubbing, cyanosis, edema.  Radials/DP/PT 2+ and equal bilaterally.  Respiratory:  Respirations regular and unlabored, clear to auscultation bilaterally. GI: Soft, nontender, nondistended, BS + x 4. MS: no deformity or atrophy. Skin: warm and dry, no rash. Neuro:  Strength and sensation are intact. Psych: Normal affect.  Accessory Clinical Findings    ECG personally reviewed by me today - EKG Interpretation Date/Time:  Tuesday April 05 2024 14:02:02 EDT Ventricular Rate:  147 PR Interval:    QRS Duration:  84 QT Interval:  252 QTC Calculation: 394 R Axis:   118  Text Interpretation: Atrial fibrillation with rapid ventricular response Right axis deviation ST & T wave abnormality, consider lateral ischemia When compared with ECG of 15-Mar-2024 15:43, T wave inversion now evident in Lateral leads Confirmed by Daneen Perkins ([id number]) on 04/05/2024 2:19:59 PM  - no acute changes.   Lab Results  Component Value Date   WBC 5.8 02/21/2024   HGB 13.7 02/21/2024   HCT 40.7 02/21/2024   MCV 97.8 02/21/2024   PLT 149 (L) 02/21/2024   Lab Results  Component Value Date   CREATININE 0.72 02/21/2024   BUN 15 02/21/2024   NA 137 02/21/2024   K 4.1 02/21/2024   CL 103 02/21/2024   CO2 27 02/21/2024   Lab Results  Component Value Date   ALT 18 05/13/2023   AST 22 05/13/2023   ALKPHOS 87 05/13/2023   BILITOT 1.4 (H) 05/13/2023   Lab Results  Component Value Date   CHOL 232 (H) 07/03/2023   HDL 44 07/03/2023   LDLCALC 170 (H) 07/03/2023   TRIG 90 07/03/2023   CHOLHDL 5.3 07/03/2023    Lab Results  Component Value Date   HGBA1C  5.1 07/03/2023    Assessment & Plan    1.  ***      Perkins JAYSON Daneen, NP 04/05/2024, 2:20 PM

## 2024-04-05 NOTE — Patient Instructions (Addendum)
 Medication Instructions:  Lasix  20 mg daily. Please hold on the days systolic BP (top number) is less than 90 *If you need a refill on your cardiac medications before your next appointment, please call your pharmacy*  Lab Work: BMET, CBC, TSH, Magnesium  today  Testing/Procedures: Cardioversion (no TEE).      Dear Jonathan Romero  You are scheduled for a Cardioversion on Wednesday, July 16 with Dr. Wilbert Bihari.  Please arrive at the [facility] (Main Entrance A) at [facility]: [address] [place], [place] [address] at 1:00 PM (This time is 1 hour(s) before your procedure to ensure your preparation).   Free valet parking service is available. You will check in at ADMITTING.   *Please Note: You will receive a call the day before your procedure to confirm the appointment time. That time may have changed from the original time based on the schedule for that day.*   DIET:  Nothing to eat or drink after midnight except a sip of water with medications (see medication instructions below)  MEDICATION INSTRUCTIONS:    Hold Lasix  the morning of your procedure      Continue taking your anticoagulant (blood thinner): Apixaban  (Eliquis ).  You will need to continue this after your procedure until you are told by your provider that it is safe to stop.    LABS: Done after office visit on 04/05/2024; BMET, CBC, Magnesium  and TSH  FYI:  For your safety, and to allow us  to monitor your vital signs accurately during the surgery/procedure we request: If you have artificial nails, gel coating, SNS etc, please have those removed prior to your surgery/procedure. Not having the nail coverings /polish removed may result in cancellation or delay of your surgery/procedure.  Your support person will be asked to wait in the waiting room during your procedure.  It is OK to have someone drop you off and come back when you are ready to be discharged.  You cannot drive after the procedure and will need  someone to drive you home.  Bring your insurance cards.  *Special Note: Every effort is made to have your procedure done on time. Occasionally there are emergencies that occur at the hospital that may cause delays. Please be patient if a delay does occur.      Follow-Up: At [facility], you and your health needs are our priority.  As part of our continuing mission to provide you with exceptional heart care, our providers are all part of one team.  This team includes your primary Cardiologist (physician) and Advanced Practice Providers or APPs (Physician Assistants and Nurse Practitioners) who all work together to provide you with the care you need, when you need it.  Your next appointment:   Afib Clinic 3 weeks post cardioversion 3 months (Dr. Swaziland or Damien Braver NP)  Provider:   Peter Swaziland, MD or Damien Braver, NP

## 2024-04-06 ENCOUNTER — Ambulatory Visit (HOSPITAL_COMMUNITY)
Admission: RE | Admit: 2024-04-06 | Discharge: 2024-04-06 | Disposition: A | Discharge status: Home / Self Care | Source: Ambulatory Visit | Attending: Cardiology | Admitting: Cardiology

## 2024-04-06 ENCOUNTER — Other Ambulatory Visit: Payer: Self-pay

## 2024-04-06 ENCOUNTER — Encounter: Payer: Self-pay | Admitting: Nurse Practitioner

## 2024-04-06 ENCOUNTER — Ambulatory Visit (HOSPITAL_COMMUNITY): Admitting: Anesthesiology

## 2024-04-06 ENCOUNTER — Encounter (HOSPITAL_COMMUNITY)
Admission: RE | Disposition: A | Discharge status: Home / Self Care | Payer: Self-pay | Source: Ambulatory Visit | Attending: Cardiology

## 2024-04-06 DIAGNOSIS — Z952 Presence of prosthetic heart valve: Secondary | ICD-10-CM | POA: Diagnosis not present

## 2024-04-06 DIAGNOSIS — I4819 Other persistent atrial fibrillation: Secondary | ICD-10-CM | POA: Diagnosis present

## 2024-04-06 DIAGNOSIS — Z955 Presence of coronary angioplasty implant and graft: Secondary | ICD-10-CM | POA: Diagnosis not present

## 2024-04-06 DIAGNOSIS — I10 Essential (primary) hypertension: Secondary | ICD-10-CM | POA: Diagnosis not present

## 2024-04-06 DIAGNOSIS — I2510 Atherosclerotic heart disease of native coronary artery without angina pectoris: Secondary | ICD-10-CM | POA: Insufficient documentation

## 2024-04-06 DIAGNOSIS — Z8673 Personal history of transient ischemic attack (TIA), and cerebral infarction without residual deficits: Secondary | ICD-10-CM | POA: Insufficient documentation

## 2024-04-06 DIAGNOSIS — Z7901 Long term (current) use of anticoagulants: Secondary | ICD-10-CM | POA: Diagnosis not present

## 2024-04-06 DIAGNOSIS — Z87891 Personal history of nicotine dependence: Secondary | ICD-10-CM | POA: Diagnosis not present

## 2024-04-06 DIAGNOSIS — Z79899 Other long term (current) drug therapy: Secondary | ICD-10-CM | POA: Diagnosis not present

## 2024-04-06 DIAGNOSIS — I5022 Chronic systolic (congestive) heart failure: Secondary | ICD-10-CM | POA: Diagnosis not present

## 2024-04-06 DIAGNOSIS — I471 Supraventricular tachycardia, unspecified: Secondary | ICD-10-CM | POA: Insufficient documentation

## 2024-04-06 DIAGNOSIS — E785 Hyperlipidemia, unspecified: Secondary | ICD-10-CM | POA: Insufficient documentation

## 2024-04-06 DIAGNOSIS — I252 Old myocardial infarction: Secondary | ICD-10-CM | POA: Diagnosis not present

## 2024-04-06 DIAGNOSIS — I480 Paroxysmal atrial fibrillation: Secondary | ICD-10-CM | POA: Diagnosis not present

## 2024-04-06 DIAGNOSIS — I4891 Unspecified atrial fibrillation: Secondary | ICD-10-CM

## 2024-04-06 HISTORY — PX: CARDIOVERSION: EP1203

## 2024-04-06 LAB — BASIC METABOLIC PANEL WITH GFR
BUN/Creatinine Ratio: 27 — ABNORMAL HIGH (ref 10–24)
BUN: 35 mg/dL — ABNORMAL HIGH (ref 8–27)
CO2: 21 mmol/L (ref 20–29)
Calcium: 8.9 mg/dL (ref 8.6–10.2)
Chloride: 103 mmol/L (ref 96–106)
Creatinine, Ser: 1.31 mg/dL — ABNORMAL HIGH (ref 0.76–1.27)
Glucose: 89 mg/dL (ref 70–99)
Potassium: 5.2 mmol/L (ref 3.5–5.2)
Sodium: 140 mmol/L (ref 134–144)
eGFR: 55 mL/min/1.73 — ABNORMAL LOW (ref >59)

## 2024-04-06 LAB — CBC
Hematocrit: 42.7 % (ref 37.5–51.0)
Hemoglobin: 13.8 g/dL (ref 13.0–17.7)
MCH: 33.1 pg — ABNORMAL HIGH (ref 26.6–33.0)
MCHC: 32.3 g/dL (ref 31.5–35.7)
MCV: 102 fL — ABNORMAL HIGH (ref 79–97)
Platelets: 121 x10E3/uL — ABNORMAL LOW (ref 150–450)
RBC: 4.17 x10E6/uL (ref 4.14–5.80)
RDW: 13 % (ref 11.6–15.4)
WBC: 7.5 x10E3/uL (ref 3.4–10.8)

## 2024-04-06 LAB — TSH: TSH: 1.75 u[IU]/mL (ref 0.450–4.500)

## 2024-04-06 LAB — MAGNESIUM: Magnesium: 2.5 mg/dL — ABNORMAL HIGH (ref 1.6–2.3)

## 2024-04-06 SURGERY — CARDIOVERSION (CATH LAB)
Anesthesia: General

## 2024-04-06 MED ORDER — LIDOCAINE HCL (PF) 2 % IJ SOLN
INTRAMUSCULAR | Status: DC | PRN
Start: 2024-04-06 — End: 2024-04-06
  Administered 2024-04-06: 100 mg via INTRADERMAL

## 2024-04-06 MED ORDER — PROPOFOL 10 MG/ML IV BOLUS
INTRAVENOUS | Status: DC | PRN
Start: 2024-04-06 — End: 2024-04-06
  Administered 2024-04-06: 30 mg via INTRAVENOUS

## 2024-04-06 SURGICAL SUPPLY — 1 items: PAD DEFIB RADIO PHYSIO CONN (PAD) ×1 IMPLANT

## 2024-04-06 NOTE — CV Procedure (Signed)
    Electrical Cardioversion Procedure Note KOAH CHISENHALL [id number] [birth year]  Procedure: Electrical Cardioversion Indications:  Atrial Fibrillation  Time Out: Verified patient identification, verified procedure,medications/allergies/relevent history reviewed, required imaging and test results available.  Performed  Procedure Details  During this procedure the patient is administered a total of Propofol  30 mg and Lidocaine  100 mg to achieve and maintain moderate conscious sedation.  The patient's heart rate, blood pressure, and oxygen saturation are monitored continuously during the procedure. The period of conscious sedation is 2 minutes, of which I was present face-to-face 100% of this time. Arboriculturist, CRNA is an independent, trained observer who assisted in the monitoring of the patient's level of consciousness.     Cardioversion was done with synchronized biphasic defibrillation with AP pads with 200watts.  The patient converted to normal sinus rhythm. The patient tolerated the procedure well   IMPRESSION:  Successful cardioversion of atrial fibrillation    Donald Jacque 04/06/2024, 12:06 PM

## 2024-04-06 NOTE — Discharge Instructions (Signed)

## 2024-04-06 NOTE — Interval H&P Note (Signed)
 History and Physical Interval Note:  04/06/2024 12:06 PM  Jonathan Romero  has presented today for surgery, with the diagnosis of PAF.  The various methods of treatment have been discussed with the patient and family. After consideration of risks, benefits and other options for treatment, the patient has consented to  Procedure(s): CARDIOVERSION (N/A) as a surgical intervention.  The patient's history has been reviewed, patient examined, no change in status, stable for surgery.  I have reviewed the patient's chart and labs.  Questions were answered to the patient's satisfaction.     Wilbert Bihari

## 2024-04-06 NOTE — Anesthesia Preprocedure Evaluation (Addendum)
 Anesthesia Evaluation  Patient identified by MRN, date of birth, ID band Patient awake    Reviewed: Allergy & Precautions, NPO status , Patient's Chart, lab work & pertinent test results, reviewed documented beta blocker date and time   History of Anesthesia Complications Negative for: history of anesthetic complications  Airway Mallampati: II  TM Distance: >3 FB     Dental no notable dental hx.    Pulmonary neg COPD, former smoker   breath sounds clear to auscultation       Cardiovascular hypertension, + CAD and + Past MI   Rhythm:Irregular Rate:Normal  IMPRESSIONS     1. Left ventricular ejection fraction, by estimation, is 60 to 65%. The  left ventricle has normal function. The left ventricle has no regional  wall motion abnormalities. There is mild left ventricular hypertrophy.  Left ventricular diastolic parameters  are indeterminate.   2. Right ventricular systolic function is normal. The right ventricular  size is normal. There is normal pulmonary artery systolic pressure.   3. Left atrial size was moderately dilated.   4. The mitral valve is degenerative. Trivial mitral valve regurgitation.   5. S/p 26 mm Edwards Sapien 3 Ultra Resilia. V max 1.8 m/s. mean gradient  7 mmHg. no significant PVL. Aortic valve regurgitation is not visualized.  Echo findings are consistent with normal structure and function of the  aortic valve prosthesis.   6. The inferior vena cava is normal in size with greater than 50%  respiratory variability, suggesting right atrial pressure of 3 mmHg.      Neuro/Psych neg Seizures CVA    GI/Hepatic ,GERD  ,,  Endo/Other    Renal/GU      Musculoskeletal  (+) Arthritis ,    Abdominal   Peds  Hematology   Anesthesia Other Findings   Reproductive/Obstetrics                              Anesthesia Physical Anesthesia Plan  ASA: 2  Anesthesia Plan: MAC    Post-op Pain Management:    Induction: Intravenous  PONV Risk Score and Plan: 2 and Propofol  infusion  Airway Management Planned:   Additional Equipment:   Intra-op Plan:   Post-operative Plan:   Informed Consent: I have reviewed the patients History and Physical, chart, labs and discussed the procedure including the risks, benefits and alternatives for the proposed anesthesia with the patient or authorized representative who has indicated his/her understanding and acceptance.     Dental advisory given  Plan Discussed with: CRNA  Anesthesia Plan Comments:         Anesthesia Quick Evaluation

## 2024-04-06 NOTE — Anesthesia Postprocedure Evaluation (Signed)
 Anesthesia Post Note  Patient: Jonathan Romero  Procedure(s) Performed: CARDIOVERSION     Patient location during evaluation: PACU Anesthesia Type: General Level of consciousness: awake and alert Pain management: pain level controlled Vital Signs Assessment: post-procedure vital signs reviewed and stable Respiratory status: spontaneous breathing, nonlabored ventilation, respiratory function stable and patient connected to nasal cannula oxygen Cardiovascular status: stable and blood pressure returned to baseline Postop Assessment: no apparent nausea or vomiting Anesthetic complications: no   No notable events documented.  Last Vitals:  Vitals:   04/06/24 1300 04/06/24 1306  BP: 96/61 102/61  Pulse: (!) 56 (!) 53  Resp: 15 16  Temp:    SpO2: 96% 96%    Last Pain:  Vitals:   04/06/24 1244  TempSrc:   PainSc: 0-No pain                 [name] Cornea

## 2024-04-06 NOTE — Transfer of Care (Signed)
 Immediate Anesthesia Transfer of Care Note  Patient: Jonathan Romero  Procedure(s) Performed: CARDIOVERSION  Patient Location: PACU and Cath Lab  Anesthesia Type:General  Level of Consciousness: awake  Airway & Oxygen Therapy: Patient Spontanous Breathing  Post-op Assessment: Report given to RN  Post vital signs: stable  Last Vitals:  Vitals Value Taken Time  BP 88/62 04/06/24 12:38  Temp    Pulse 53 04/06/24 12:41  Resp 20 04/06/24 12:41  SpO2 98 % 04/06/24 12:41    Last Pain:  Vitals:   04/06/24 1205  TempSrc: Temporal         Complications: No notable events documented.

## 2024-04-11 ENCOUNTER — Ambulatory Visit: Payer: Self-pay | Admitting: Nurse Practitioner

## 2024-04-11 DIAGNOSIS — R7989 Other specified abnormal findings of blood chemistry: Secondary | ICD-10-CM

## 2024-04-12 NOTE — Telephone Encounter (Signed)
Lab results and recommendations sent to pt via my-chart

## 2024-04-27 NOTE — Telephone Encounter (Signed)
 Patient's son is returning phone call. Please advise.

## 2024-05-09 ENCOUNTER — Ambulatory Visit (HOSPITAL_COMMUNITY)
Admission: RE | Admit: 2024-05-09 | Discharge: 2024-05-09 | Disposition: A | Discharge status: Home / Self Care | Source: Ambulatory Visit | Attending: Physician Assistant | Admitting: Physician Assistant

## 2024-05-09 ENCOUNTER — Encounter (HOSPITAL_COMMUNITY): Payer: Self-pay | Admitting: Physician Assistant

## 2024-05-09 VITALS — BP 122/60 | HR 58 | Ht 70.0 in | Wt 137.4 lb

## 2024-05-09 DIAGNOSIS — D6869 Other thrombophilia: Secondary | ICD-10-CM | POA: Diagnosis not present

## 2024-05-09 DIAGNOSIS — R7989 Other specified abnormal findings of blood chemistry: Secondary | ICD-10-CM | POA: Diagnosis not present

## 2024-05-09 DIAGNOSIS — Z79899 Other long term (current) drug therapy: Secondary | ICD-10-CM | POA: Diagnosis not present

## 2024-05-09 DIAGNOSIS — Z952 Presence of prosthetic heart valve: Secondary | ICD-10-CM | POA: Diagnosis not present

## 2024-05-09 DIAGNOSIS — I4819 Other persistent atrial fibrillation: Secondary | ICD-10-CM | POA: Diagnosis not present

## 2024-05-09 DIAGNOSIS — R001 Bradycardia, unspecified: Secondary | ICD-10-CM | POA: Diagnosis not present

## 2024-05-09 DIAGNOSIS — I2510 Atherosclerotic heart disease of native coronary artery without angina pectoris: Secondary | ICD-10-CM | POA: Insufficient documentation

## 2024-05-09 DIAGNOSIS — Z7901 Long term (current) use of anticoagulants: Secondary | ICD-10-CM | POA: Diagnosis not present

## 2024-05-09 DIAGNOSIS — I5030 Unspecified diastolic (congestive) heart failure: Secondary | ICD-10-CM | POA: Insufficient documentation

## 2024-05-09 NOTE — Progress Notes (Signed)
 Primary Care Physician: Marvine Rush, MD Primary Cardiologist: Dr Swaziland Primary Electrophysiologist: none Referring Physician: Izetta Hummer PA   Jonathan Romero is a 78 y.o. 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 On 08/09/22 he had two episodes of exertional syncope episode. His wife called 911 and EMS found the patient in SVT with a reported HR of 280. He was then cardioverted into NSR. Upon arrival to the ED he was found to have a heart murmur and NSTEMI. Echo 08/10/22 revealed EF 40-45% with severe AS. R/LHC was 08/11/22 showed a 95% calcified LAD lesion. He was evaluated by the multidisciplinary valve team and felt to not be a surgical candidate due to significant aortic calcification found on CTA. He ultimately underwent successful PCI with placement of 3 overlapping drug-eluting stents from the proximal to mid LAD on 08/13/22. He was discharged home on 08/14/22 with plans to return for TAVR. He underwent successful TAVR on 08/19/22.   He was seen on 09/24/22 for 1 month follow up. Echo showed EF 40-45%, normally functioning TAVR with a mean gradient of 8.8 mmHg and no PVL. He was noted to be in rapid afib, which was asymptomatic. Toprol  25mg  daily increased to BID. Started on Elquis which was later converted to Coumadin  due to cost issues. Aspirin  discontinued. S/p TEE guided DCCV on 10/09/22. He was admitted 06/2023 with an acute CVA and low INR. He was transitioned back to Eliquis .   Patient returns for follow up for atrial fibrillation. He was seen by Dr Swaziland 03/15/24 and found to be back in afib with RVR, BB increased. He is s/p DCCV on 04/06/24. He remains in SR and feels well. In hindsight, he was more fatigued when in afib. No bleeding issues on anticoagulation.   Today, he  denies symptoms of palpitations, chest pain, shortness of breath,  orthopnea, PND, lower extremity edema, dizziness, presyncope, syncope, snoring, daytime somnolence, bleeding, or neurologic sequela. The patient is tolerating medications without difficulties and is otherwise without complaint today.    Atrial Fibrillation Risk Factors:  he does not have symptoms or diagnosis of sleep apnea. he does not have a history of rheumatic fever. he does not have a history of alcohol use. The patient does not have a history of early familial atrial fibrillation or other arrhythmias.   Atrial Fibrillation Management history:  Previous antiarrhythmic drugs: none Previous cardioversions: 10/09/22, 04/06/24 Previous ablations: none Anticoagulation history: Eliquis , warfarin   Past Medical History:  Diagnosis Date   Arthritis    Cellulitis    Coronary artery disease    s/p 3 overlapping DES from proximal-mid LAD 08/13/22   Dyslipidemia    GERD (gastroesophageal reflux disease)    Hyperlipidemia 09/23/2011   LDL particle number [id number] with LDL CALULATED at 151 and 2300 in 1988 was the small LDL particle number,which is very high- not tinterested in taking medications   Myocardial infarction (HCC)    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 Wonda & Weldner   Severe aortic stenosis      Current Outpatient Medications  Medication Sig Dispense Refill   apixaban  (ELIQUIS ) 5 MG TABS tablet Take 1 tablet (5 mg total) by mouth 2 (two) times daily. 180 tablet 1   ezetimibe  (ZETIA ) 10 MG tablet Take 1  tablet (10 mg total) by mouth daily. 90 tablet 3   furosemide  (LASIX ) 20 MG tablet Take 20 mg daily. Hold on the days systolic blood pressure (top number) is less than 90 90 tablet 3   hydrocortisone  cream 1 % Apply 1 Application topically daily as needed (hemorrhoids).     metoprolol  succinate (TOPROL -XL) 25 MG 24 hr tablet Take 1 tablet (25 mg total) by mouth in the morning and at  bedtime. 180 tablet 3   nitroGLYCERIN  (NITROSTAT ) 0.4 MG SL tablet Place 1 tablet (0.4 mg total) under the tongue every 5 (five) minutes x 3 doses as needed for chest pain. 25 tablet 2   No current facility-administered medications for this encounter.    ROS- All systems are reviewed and negative except as per the HPI above.  Physical Exam: Vitals:   05/09/24 1352  BP: 122/60  Pulse: (!) 58  Weight: 62.3 kg  Height: 5' 10 (1.778 m)     GEN: Well nourished, well developed in no acute distress CARDIAC: Regular rate and rhythm, no murmurs, rubs, gallops RESPIRATORY:  Clear to auscultation without rales, wheezing or rhonchi  ABDOMEN: Soft, non-tender, non-distended EXTREMITIES:  No edema; No deformity    Wt Readings from Last 3 Encounters:  05/09/24 62.3 kg  04/06/24 72.6 kg  04/05/24 73.7 kg    EKG today demonstrates  SB Vent. rate 58 BPM PR interval 168 ms QRS duration 88 ms QT/QTcB 442/433 ms   Echo 08/26/23 demonstrated   1. Left ventricular ejection fraction, by estimation, is 60 to 65%. The  left ventricle has normal function. The left ventricle has no regional  wall motion abnormalities. There is mild left ventricular hypertrophy.  Left ventricular diastolic parameters are indeterminate.   2. Right ventricular systolic function is normal. The right ventricular  size is normal. There is normal pulmonary artery systolic pressure.   3. Left atrial size was moderately dilated.   4. The mitral valve is degenerative. Trivial mitral valve regurgitation.   5. S/p 26 mm Edwards Sapien 3 Ultra Resilia. V max 1.8 m/s. mean gradient  7 mmHg. no significant PVL. Aortic valve regurgitation is not visualized.  Echo findings are consistent with normal structure and function of the  aortic valve prosthesis.   6. The inferior vena cava is normal in size with greater than 50%  respiratory variability, suggesting right atrial pressure of 3 mmHg.    Epic records are reviewed at  length today  CHA2DS2-VASc Score = 6  The patient's score is based upon: CHF History: 1 HTN History: 0 Diabetes History: 0 Stroke History: 2 Vascular Disease History: 1 Age Score: 2 Gender Score: 0       ASSESSMENT AND PLAN: Persistent Atrial Fibrillation (ICD10:  I48.19) The patient's CHA2DS2-VASc score is 6, indicating a 9.7% annual risk of stroke.   S/p DCCV 04/06/24 Patient back in SR We discussed rhythm control options including dofetilide, amiodarone, and ablation. Given that his episodes are infrequent, he would like to continue with his current treatment and only consider ablation if he has more frequent afib.  Continue Eliquis  5 mg BID Continue Toprol  25 mg BID  Secondary Hypercoagulable State (ICD10:  D68.69) The patient is at significant risk for stroke/thromboembolism based upon his CHA2DS2-VASc Score of 6.  Continue Apixaban  (Eliquis ). No bleeding issues.   CAD No anginal symptoms Followed by Dr Swaziland  VHD Severe AS s/p TAVR  HFrecEF EF 60-65% GDMT per primary cardiology team Fluid status appears stable today  Follow up with Dr Swaziland as scheduled. AF clinic in 6 months.    Daril Kicks PA-C Afib [facility] [address] [place], [place] [address] [phone] 05/09/2024 1:57 PM

## 2024-05-10 LAB — MAGNESIUM: Magnesium: 2.2 mg/dL (ref 1.6–2.3)

## 2024-06-03 DIAGNOSIS — B029 Zoster without complications: Secondary | ICD-10-CM | POA: Diagnosis not present

## 2024-06-03 DIAGNOSIS — L299 Pruritus, unspecified: Secondary | ICD-10-CM | POA: Diagnosis not present

## 2024-06-17 DIAGNOSIS — L309 Dermatitis, unspecified: Secondary | ICD-10-CM | POA: Diagnosis not present

## 2024-06-24 ENCOUNTER — Other Ambulatory Visit: Payer: Self-pay | Admitting: Physician Assistant

## 2024-06-24 DIAGNOSIS — I480 Paroxysmal atrial fibrillation: Secondary | ICD-10-CM

## 2024-06-24 NOTE — Telephone Encounter (Signed)
 Prescription refill request for Eliquis  received. Indication:afib Last office visit:8/25 Scr:1.31  7/25 Age: 78 Weight:62.3  kg  Prescription refilled

## 2024-07-08 NOTE — Progress Notes (Unsigned)
 [facility]                                        Cardiology Office Note:    Date:  07/12/2024   ID:  Jonathan CHRISCOE, DOB 20-Jun-1946, MRN [id number]  PCP:  Patient, No Pcp Per  [facility] Cardiologist:  Shahrzad Koble [place], MD  [facility] Electrophysiologist:  None   Referring MD: Marvine Rush, MD   Follow up Afib, CAD, s/p TAVR  History of Present Illness:    Jonathan Romero is a 78 y.o. male with a hx of HLD, SVT, CAD requiring LAD PCI/DES x3 (08/13/22), severe AS s/p TAVR (08/19/22) and afib with RVR who presents to clinic for follow up.   On 08/09/22 he had two episodes of exertional syncope episode. His wife called 911 and EMS found the patient in SVT with a reported HR of 280. He was then cardioverted into NSR. Upon arrival to the ED he was found to have a heart murmur and NSTEMI. Echo 08/10/22 revealed EF 40-45% with severe AS with a mean gradient of 45.0 mmHg, peak gradient of 67.56mmHg, and AVA 0.53 cm2. R/LHC was 08/11/22 showed a 95% calcified LAD lesion. He was evaluated by the multidisciplinary valve team and felt to not be a surgical candidate due to significant aortic calcification found on CTA. He ultimately underwent successful PCI with orbital atherectomy, PTCA, and placement of 3 overlapping drug-eluting stents from the proximal to mid LAD on 08/13/22. He was discharged home on 08/14/22 with plans to return on 08/19/22 for TAVR.  He underwent successful TAVR with a 26 mm Edwards Sapien 3 Ultra Resilia THV via the TF approach on 08/19/22. Post operative echo showed EF 55%, normally functioning TAVR with a mean gradient of 12 mmHg and no PVL.   He has done well in follow up with an improvement in his symptoms. He was seen on 09/24/22 for 1 month follow up. Echo showed EF 40-45%, normally functioning TAVR with a mean gradient of 8.8 mmHg and no PVL. He was noted to be in rapid afib, which was asymptomatic. Toprol  25mg  daily increased to BID. Started on Elquis   s/p TEE guided DCCV on 10/09/22. He was seen in Afib clinic on 10/16/22 and was still in NSR at that time. Of note TEE showed significant decrease in LV function with EF 25%.   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 CTA demonstrated no large vessel occlusion, hemodynamically significant stenosis, or aneurysm in the head or neck. EKG and telemetry demonstrated NSR. CT head demonstrated no acute intracranial abnormality. MRI brain demonstrated small linear acute infarct in the right parietal lobe with no acute hemorrhage. Neurology did not recommend a TTE from a stroke standpoint as he recently had a TTE on 03/05/2023 with normal EF. His INR was subtherapeutic and coumadin  was adjusted. He was later transitioned to Eliquis .  LDL was 170 and it was recommended he start Leqvio. His insurance did cover it but patient declined use- did not like the risks.    When seen earlier this summer he was in Afib. Underwent DCCV in July.   On follow up today he is doing well. Denies any recurrent palpitations, chest pain or dyspnea. Energy level is good. 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 Wonda & Weldner   Severe aortic stenosis     Past Surgical History:  Procedure Laterality Date   CARDIOVERSION N/A 10/09/2022   Procedure: CARDIOVERSION;  Surgeon: Okey Vina GAILS, MD;  Location: [facility] ENDOSCOPY;  Service: Cardiovascular;  Laterality: N/A;   CARDIOVERSION N/A 04/06/2024    Procedure: CARDIOVERSION;  Surgeon: Shlomo Wilbert SAUNDERS, MD;  Location: [facility] INVASIVE CV LAB;  Service: Cardiovascular;  Laterality: N/A;   CATARACT EXTRACTION     CORONARY ATHERECTOMY N/A 08/13/2022   Procedure: CORONARY ATHERECTOMY;  Surgeon: Wonda Sharper, MD;  Location: [facility] INVASIVE CV LAB;  Service: Cardiovascular;  Laterality: N/A;   CORONARY STENT INTERVENTION N/A 08/13/2022   Procedure: CORONARY STENT INTERVENTION;  Surgeon: Wonda Sharper, MD;  Location: [facility] INVASIVE CV LAB;  Service: Cardiovascular;  Laterality: N/A;   DOPPLER ECHOCARDIOGRAPHY  12/21/2012   EF 55-60%,   DOPPLER ECHOCARDIOGRAPHY  12/16/2011   MILD AORTIC STENOSIS,PEAK AND MEAN GRADIENTS OF 18 AND 8 mmHg and a valve area of around 2 square cm.    INTRAOPERATIVE TRANSTHORACIC ECHOCARDIOGRAM N/A 08/19/2022   Procedure: INTRAOPERATIVE TRANSTHORACIC ECHOCARDIOGRAM;  Surgeon: Wonda Sharper, MD;  Location: [facility] OR;  Service: Open Heart Surgery;  Laterality: N/A;   RIGHT/LEFT HEART CATH AND CORONARY ANGIOGRAPHY N/A 08/11/2022   Procedure: RIGHT/LEFT HEART CATH AND CORONARY ANGIOGRAPHY;  Surgeon: Claudene Victory ORN, MD;  Location: [facility] INVASIVE CV LAB;  Service: Cardiovascular;  Laterality: N/A;   TEE WITHOUT CARDIOVERSION N/A 10/09/2022   Procedure: TRANSESOPHAGEAL ECHOCARDIOGRAM (TEE);  Surgeon: Okey Vina GAILS, MD;  Location: [facility] ENDOSCOPY;  Service: Cardiovascular;  Laterality: N/A;   TRANSCATHETER AORTIC VALVE REPLACEMENT, TRANSFEMORAL N/A 08/19/2022   Procedure: Transcatheter Aortic Valve Replacement, Transfemoral using a 26 MM Edwards SAPIEN 3 Ultra;  Surgeon: Wonda Sharper, MD;  Location: [facility] OR;  Service: Open Heart Surgery;  Laterality: N/A;  Transfemoral    Current Medications: Current Meds  Medication Sig   amoxicillin  (AMOXIL ) 500 MG tablet Take 4 tablets (2,000 mg total) by mouth once as needed for up to 1 dose (one hour prior to dental work.).   apixaban  (ELIQUIS ) 5 MG TABS tablet Take 1 tablet by mouth twice daily    ezetimibe  (ZETIA ) 10 MG tablet Take 1 tablet (10 mg total) by mouth daily.   furosemide  (LASIX ) 20 MG tablet Take 20 mg daily. Hold on the days systolic blood pressure (top number) is less than 90   hydrocortisone  cream 1 % Apply 1 Application topically daily as needed (hemorrhoids).   metoprolol  succinate (TOPROL -XL) 25 MG 24 hr tablet Take 1 tablet (25 mg total) by mouth in the morning and at bedtime.   [DISCONTINUED] nitroGLYCERIN  (NITROSTAT ) 0.4 MG SL tablet Place 1 tablet (0.4 mg total) under the tongue every 5 (five) minutes x 3 doses as needed for chest pain.     Allergies:   Doxycycline  and Statins   Social History   Socioeconomic History   Marital status: Married    Spouse name: Henrita   Number of children: 3  Years of education: Not on file   Highest education level: High school graduate  Occupational History   Occupation: retired  Tobacco Use   Smoking status: Former    Current packs/day: 0.00    Types: Cigarettes    Quit date: 04/15/1983    Years since quitting: 41.2   Smokeless tobacco: Never   Tobacco comments:    Former smoker 10/16/22  Vaping Use   Vaping status: Never Used  Substance and Sexual Activity   Alcohol use: Yes    Comment: beer occasionally 10/16/22   Drug use: No   Sexual activity: Not on file  Other Topics Concern   Not on file  Social History Narrative   Not on file   Social Drivers of Health   Financial Resource Strain: Low Risk  (08/13/2022)   Overall Financial Resource Strain (CARDIA)    Difficulty of Paying Living Expenses: Not hard at all  Food Insecurity: No Food Insecurity (07/03/2023)   Hunger Vital Sign    Worried About Running Out of Food in the Last Year: Never true    Ran Out of Food in the Last Year: Never true  Transportation Needs: No Transportation Needs (07/03/2023)   PRAPARE - Administrator, Civil Service (Medical): No    Lack of Transportation (Non-Medical): No  Physical Activity: Not on file  Stress:  Not on file  Social Connections: Not on file     Family History: The patient's family history includes Diabetes in his mother; Heart failure in his father.  ROS:   Please see the history of present illness.    All other systems reviewed and are negative.  EKGs/Labs/Other Studies Reviewed:    The following studies were reviewed today:  TAVR OPERATIVE NOTE     Date of Procedure:                08/19/2022   Preoperative Diagnosis:      Severe Aortic Stenosis    Postoperative Diagnosis:    Same    Procedure:        Transcatheter Aortic Valve Replacement - Percutaneous  Transfemoral Approach             Edwards Sapien 3 Ultra Resilia THV (size 26 mm, serial #[id number])              Co-Surgeons:                        Deward Kallman, MD and Ozell Fell, MD   Anesthesiologist:                  Zachary Ee, MD   Echocardiographer:              Jerel Balding, MD   Pre-operative Echo Findings: Severe aortic stenosis Normal left ventricular systolic function   Post-operative Echo Findings: No paravalvular leak Normal/unchanged left ventricular systolic function   ___________________________     Echo 08/20/22:  IMPRESSIONS  1. Left ventricular ejection fraction, by estimation, is 55 to 60%. The  left ventricle has normal function. The left ventricle demonstrates  regional wall motion abnormalities (see scoring diagram/findings for  description). Left ventricular diastolic  parameters are consistent with Grade II diastolic dysfunction  (pseudonormalization). Elevated left atrial pressure. There is moderate  hypokinesis of the left ventricular, basal-mid inferolateral wall.   2. Right ventricular systolic function is normal. The right ventricular  size is normal. There is mildly elevated pulmonary artery systolic  pressure.   3. Left atrial size was moderately dilated.   4. The mitral valve is normal in structure. Trivial mitral valve  regurgitation. Moderate mitral  annular calcification.   5. The aortic valve has been repaired/replaced. Aortic valve  regurgitation is not visualized. There is a valve present in the aortic  position. Echo findings are consistent with normal structure and function  of the aortic valve prosthesis. Aortic valve  mean gradient measures 12.0 mmHg. Aortic valve Vmax measures 2.39 m/s.  Aortic valve acceleration time measures 85 msec.   6. The inferior vena cava is dilated in size with <50% respiratory  variability, suggesting right atrial pressure of 15 mmHg.   Comparison(s): Prior images reviewed side by side. The left ventricular  function has improved.   ______________________  Echo 09/24/22 IMPRESSIONS   1. Left ventricular ejection fraction, by estimation, is 40 to 45%. The  left ventricle has mildly decreased function. The left ventricle  demonstrates global hypokinesis. Left ventricular diastolic function could  not be evaluated.   2. Right ventricular systolic function is normal. The right ventricular  size is normal. There is normal pulmonary artery systolic pressure.   3. Left atrial size was moderately dilated.   4. The mitral valve is normal in structure. Trivial mitral valve  regurgitation. No evidence of mitral stenosis.   5. The aortic valve has been repaired/replaced. Aortic valve  regurgitation is not visualized. There is a 26 mm Edwards Sapien  prosthetic (TAVR) valve present in the aortic position. Echo findings are  consistent with normal structure and function of the  aortic valve prosthesis. Aortic valve area, by VTI measures 1.65 cm.  Aortic valve mean gradient measures 8.8 mmHg.   6. The inferior vena cava is normal in size with greater than 50%  respiratory variability, suggesting right atrial pressure of 3 mmHg.   Comparison(s): Prior images reviewed side by side. Changes from prior  study are noted. EF reduced compared to echo from 08/20/22, but is similar  to echo from 08/10/22  (pre-TAVR).   Conclusion(s)/Recommendation(s): In atrial fibrillation with RVR  throughout study.   TEE 10/09/22: IMPRESSIONS     1. LV function appears severely depressed with global hypokinesis. . Left  ventricular ejection fraction, by estimation, is 25%. The left ventricle  has severely decreased function.   2. Right ventricular systolic function is severely reduced. The right  ventricular size is normal.   3. No left atrial/left atrial appendage thrombus was detected.   4. The mitral valve is normal in structure. Mild mitral valve  regurgitation.   5. S/p TAVR (26 mm Edwards S3UR; procedure date 08/19/22) Leaflets open  well . The aortic valve has been repaired/replaced. Aortic valve  regurgitation is not visualized. There is a 26 mm Sapien prosthetic (TAVR)  valve present in the aortic position.  Procedure Date: 08/19/22.   6. There is mild (Grade II) plaque.   Echo 03/05/23: IMPRESSIONS     1. Left ventricular ejection fraction, by estimation, is 60 to 65%. Left  ventricular ejection fraction by 3D volume is 63 %. The left ventricle has  normal function. The left ventricle has no regional wall motion  abnormalities. There is mild left  ventricular hypertrophy. Left ventricular diastolic parameters were  grossly normal.   2. Right ventricular systolic function is normal. The right ventricular  size is normal. Tricuspid regurgitation signal is inadequate for assessing  PA pressure.   3. Left atrial size was mild to moderately dilated.  4. The mitral valve is degenerative. Trivial mitral valve regurgitation.  No evidence of mitral stenosis.   5. The aortic valve has been repaired/replaced. Aortic valve  regurgitation is not visualized. There is a 26 mm Sapien prosthetic (TAVR)  valve present in the aortic position. Procedure Date: 08/19/22. Echo  findings are consistent with normal structure  and function of the aortic valve prosthesis. Aortic valve area, by VTI   measures 2.22 cm. Aortic valve mean gradient measures 9.0 mmHg. Aortic  valve Vmax measures 2.01 m/s.   6. The inferior vena cava is normal in size with greater than 50%  respiratory variability, suggesting right atrial pressure of 3 mmHg.   Echo 08/26/23: IMPRESSIONS     1. Left ventricular ejection fraction, by estimation, is 60 to 65%. The  left ventricle has normal function. The left ventricle has no regional  wall motion abnormalities. There is mild left ventricular hypertrophy.  Left ventricular diastolic parameters  are indeterminate.   2. Right ventricular systolic function is normal. The right ventricular  size is normal. There is normal pulmonary artery systolic pressure.   3. Left atrial size was moderately dilated.   4. The mitral valve is degenerative. Trivial mitral valve regurgitation.   5. S/p 26 mm Edwards Sapien 3 Ultra Resilia. V max 1.8 m/s. mean gradient  7 mmHg. no significant PVL. Aortic valve regurgitation is not visualized.  Echo findings are consistent with normal structure and function of the  aortic valve prosthesis.   6. The inferior vena cava is normal in size with greater than 50%  respiratory variability, suggesting right atrial pressure of 3 mmHg.   Comparison(s): No significant change from prior study.        Recent Labs: 04/05/2024: BUN 35; Creatinine, Ser 1.31; Hemoglobin 13.8; Platelets 121; Potassium 5.2; Sodium 140; TSH 1.750 05/09/2024: Magnesium  2.2  Recent Lipid Panel    Component Value Date/Time   CHOL 232 (H) 07/03/2023 1740   CHOL 200 (H) 03/21/2013 1142   TRIG 90 07/03/2023 1740   TRIG 192 (H) 03/21/2013 1142   HDL 44 07/03/2023 1740   HDL 31 (L) 03/21/2013 1142   CHOLHDL 5.3 07/03/2023 1740   VLDL 18 07/03/2023 1740   LDLCALC 170 (H) 07/03/2023 1740   LDLCALC 131 (H) 03/21/2013 1142     Risk Assessment/Calculations:       Physical Exam:    VS:  BP 110/62 (BP Location: Left Arm, Patient Position: Sitting, Cuff Size:  Normal)   Pulse 64   Resp 16   Ht 5' 10 (1.778 m)   Wt 135 lb 8 oz (61.5 kg)   SpO2 96%   BMI 19.44 kg/m     Wt Readings from Last 3 Encounters:  07/12/24 135 lb 8 oz (61.5 kg)  05/09/24 137 lb 6.4 oz (62.3 kg)  04/06/24 160 lb (72.6 kg)     GEN:  Well nourished, well developed in no acute distress HEENT: Normal NECK: No JVD LYMPHATICS: No lymphadenopathy CARDIAC: RRR,  no murmurs, rubs, gallops RESPIRATORY:  Clear to auscultation without rales, wheezing or rhonchi  ABDOMEN: Soft, non-tender, non-distended MUSCULOSKELETAL:  No edema; No deformity  SKIN: Warm and dry. NEUROLOGIC:  Alert and oriented x 3 PSYCHIATRIC:  Normal affect   ASSESSMENT:    1. S/P TAVR (transcatheter aortic valve replacement)   2. PAF (paroxysmal atrial fibrillation) (HCC)   3. Aortic stenosis, severe   4. Coronary artery disease involving native heart without angina pectoris, unspecified vessel or lesion type  5. Hyperlipidemia, unspecified hyperlipidemia type          PLAN:    In order of problems listed above:  Atrial fibrillation with RVR: s/p TEE guided DCCV in January 2024.  Repeat DCCV in July 2025. Appears to be maintaining NSR today  Severe AS s/p TAVR: valve stable. Last Echo in Dec 2024.   HLD: continue ezetimibe  10mg  daily. LDL 170. He is statin intolerant (has been tried on at least 3 by previous PCP). Declines additional therapy.   CAD: underwent successful PCI with orbital atherectomy, PTCA, and placement of 3 overlapping drug-eluting stents from the proximal to mid LAD on 08/13/22. Off antiplatelet therapy since on Eliquis .  As above, statin intolerant. no angina.    Chronic Systolic CHF. EF significantly decreased on TEE to 25%. 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 Repeat Echo showed improvement to 60-65%  S/p right parietal CVA with subtherapeutic INR. Now on Eliquis   HTN. BP was very low and amlodipine  discontinued with improvement. Now on Toprol   alone.   Signed, Taresa Montville [place], MD  07/12/2024 10:40 AM    [facility]

## 2024-07-12 ENCOUNTER — Ambulatory Visit: Attending: Cardiology | Admitting: Cardiology

## 2024-07-12 ENCOUNTER — Encounter: Payer: Self-pay | Admitting: Cardiology

## 2024-07-12 VITALS — BP 110/62 | HR 64 | Resp 16 | Ht 70.0 in | Wt 135.5 lb

## 2024-07-12 DIAGNOSIS — I350 Nonrheumatic aortic (valve) stenosis: Secondary | ICD-10-CM | POA: Insufficient documentation

## 2024-07-12 DIAGNOSIS — E785 Hyperlipidemia, unspecified: Secondary | ICD-10-CM | POA: Diagnosis not present

## 2024-07-12 DIAGNOSIS — I480 Paroxysmal atrial fibrillation: Secondary | ICD-10-CM | POA: Insufficient documentation

## 2024-07-12 DIAGNOSIS — I2510 Atherosclerotic heart disease of native coronary artery without angina pectoris: Secondary | ICD-10-CM | POA: Insufficient documentation

## 2024-07-12 DIAGNOSIS — Z952 Presence of prosthetic heart valve: Secondary | ICD-10-CM | POA: Diagnosis not present

## 2024-07-12 MED ORDER — NITROGLYCERIN 0.4 MG SL SUBL: 0.4000 mg | SUBLINGUAL_TABLET | SUBLINGUAL | 2 refills | Status: AC | PRN

## 2024-07-12 MED ORDER — AMOXICILLIN 500 MG PO TABS: 2000.0000 mg | ORAL_TABLET | Freq: Once | ORAL | 6 refills | Status: AC | PRN

## 2024-07-12 NOTE — Patient Instructions (Signed)
 Medication Instructions:  Continue same medications *If you need a refill on your cardiac medications before your next appointment, please call your pharmacy*  Lab Work: None ordered  Testing/Procedures: None ordered  Follow-Up: At [facility], you and your health needs are our priority.  As part of our continuing mission to provide you with exceptional heart care, our providers are all part of one team.  This team includes your primary Cardiologist (physician) and Advanced Practice Providers or APPs (Physician Assistants and Nurse Practitioners) who all work together to provide you with the care you need, when you need it.  Your next appointment  6 months   Call in Jan to schedule April appointment     Provider:  Dr.Jordan   We recommend signing up for the patient portal called MyChart.  Sign up information is provided on this After Visit Summary.  MyChart is used to connect with patients for Virtual Visits (Telemedicine).  Patients are able to view lab/test results, encounter notes, upcoming appointments, etc.  Non-urgent messages can be sent to your provider as well.   To learn more about what you can do with MyChart, go to [email or web address].

## 2024-07-20 ENCOUNTER — Encounter: Payer: Self-pay | Admitting: Physician Assistant

## 2024-07-20 ENCOUNTER — Ambulatory Visit: Admitting: Physician Assistant

## 2024-07-20 VITALS — BP 143/64 | HR 65 | Temp 97.9°F | Ht 70.0 in | Wt 134.2 lb

## 2024-07-20 DIAGNOSIS — Z7689 Persons encountering health services in other specified circumstances: Secondary | ICD-10-CM

## 2024-07-20 DIAGNOSIS — J069 Acute upper respiratory infection, unspecified: Secondary | ICD-10-CM | POA: Insufficient documentation

## 2024-07-20 MED ORDER — PROMETHAZINE-DM 6.25-15 MG/5ML PO SYRP: 5.0000 mL | ORAL_SOLUTION | Freq: Four times a day (QID) | ORAL | 0 refills | Status: AC | PRN

## 2024-07-20 NOTE — Assessment & Plan Note (Signed)
 Patient appears stable today. Benign exam, lungs clear to auscultation bilaterally, no indication for chest XR at this time. Likely self-resolving viral infection. Supportive care reviewed with patient. Discussed with patient that there are no indications for antibiotics at this time, and viral respiratory illness can be persistent in duration. Promethazine DM for cough as needed. Tylenol  for pain or fever as needed. May continue with OTC cold medications, Mucinex samples given today. Patient instructed to return to clinic if worsening shortness of breath, chest pain, hypoxia, or other concerns. Patient agreeable to plan.

## 2024-07-20 NOTE — Progress Notes (Signed)
 New Patient Office Visit  Subjective    Patient ID: Jonathan Romero, male    DOB: 07/24/1946  Age: 78 y.o. MRN: [id number]  CC:  Chief Complaint  Patient presents with   New Patient (Initial Visit)    Pt. Is a new patient.  Only concern he has was feeling like he was catching a cold. Has a cough and will hack up green/yellowish mucus at times.     HPI Jonathan Romero presents to establish care  Discussed the use of AI scribe software for clinical note transcription with the patient, who gave verbal consent to proceed.  History of Present Illness Jonathan Romero is an 78 year old male with atrial fibrillation and a history of stroke who presents with cough and congestion. He is accompanied by his son.   He has experienced a cough and congestion for the past three to four days. The cough is occasionally productive with mucus. He denies fever, pain, or difficulty breathing. He has not taken any medication for these symptoms.  He has undergone heart valve replacement and cardioversion for atrial fibrillation. He has a past stroke and is on medication to prevent further strokes. He follows up with cardiology and neurology as needed.  His family history includes high cholesterol. He denies recent issues with high blood pressure, noting low readings at home. He quit smoking over thirty years ago and remains generally active, caring for multiple cats at home. He does not have additional concerns or complaints today.     Outpatient Encounter Medications as of 07/20/2024  Medication Sig   amoxicillin  (AMOXIL ) 500 MG tablet Take 4 tablets (2,000 mg total) by mouth once as needed for up to 1 dose (one hour prior to dental work.).   apixaban  (ELIQUIS ) 5 MG TABS tablet Take 1 tablet by mouth twice daily   ezetimibe  (ZETIA ) 10 MG tablet Take 1 tablet (10 mg total) by mouth daily.   furosemide  (LASIX ) 20 MG tablet Take 20 mg daily. Hold on the days systolic blood pressure (top number) is less than  90   hydrocortisone  cream 1 % Apply 1 Application topically daily as needed (hemorrhoids).   metoprolol  succinate (TOPROL -XL) 25 MG 24 hr tablet Take 1 tablet (25 mg total) by mouth in the morning and at bedtime.   nitroGLYCERIN  (NITROSTAT ) 0.4 MG SL tablet Place 1 tablet (0.4 mg total) under the tongue every 5 (five) minutes x 3 doses as needed for chest pain.   promethazine-dextromethorphan (PROMETHAZINE-DM) 6.25-15 MG/5ML syrup Take 5 mLs by mouth 4 (four) times daily as needed for cough.   No facility-administered encounter medications on file as of 07/20/2024.    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 Wonda & Weldner   Severe aortic stenosis     Past Surgical History:  Procedure Laterality Date   CARDIOVERSION N/A 10/09/2022   Procedure: CARDIOVERSION;  Surgeon: Okey Vina GAILS, MD;  Location:  [facility] ENDOSCOPY;  Service: Cardiovascular;  Laterality: N/A;   CARDIOVERSION N/A 04/06/2024   Procedure: CARDIOVERSION;  Surgeon: Shlomo Wilbert SAUNDERS, MD;  Location: [facility] INVASIVE CV LAB;  Service: Cardiovascular;  Laterality: N/A;   CATARACT EXTRACTION     CORONARY ATHERECTOMY N/A 08/13/2022   Procedure: CORONARY ATHERECTOMY;  Surgeon: Wonda Sharper, MD;  Location: [facility] INVASIVE CV LAB;  Service: Cardiovascular;  Laterality: N/A;   CORONARY STENT INTERVENTION N/A 08/13/2022   Procedure: CORONARY STENT INTERVENTION;  Surgeon: Wonda Sharper, MD;  Location: [facility] INVASIVE CV LAB;  Service:  Cardiovascular;  Laterality: N/A;   DOPPLER ECHOCARDIOGRAPHY  12/21/2012   EF 55-60%,   DOPPLER ECHOCARDIOGRAPHY  12/16/2011   MILD AORTIC STENOSIS,PEAK AND MEAN GRADIENTS OF 18 AND 8 mmHg and a valve area of around 2 square cm.    INTRAOPERATIVE TRANSTHORACIC ECHOCARDIOGRAM N/A 08/19/2022   Procedure: INTRAOPERATIVE TRANSTHORACIC ECHOCARDIOGRAM;  Surgeon: Wonda Sharper, MD;  Location: [facility] OR;  Service: Open Heart Surgery;  Laterality: N/A;   RIGHT/LEFT HEART CATH AND CORONARY ANGIOGRAPHY N/A 08/11/2022   Procedure: RIGHT/LEFT HEART CATH AND CORONARY ANGIOGRAPHY;  Surgeon: Claudene Victory ORN, MD;  Location: [facility] INVASIVE CV LAB;  Service: Cardiovascular;  Laterality: N/A;   TEE WITHOUT CARDIOVERSION N/A 10/09/2022   Procedure: TRANSESOPHAGEAL ECHOCARDIOGRAM (TEE);  Surgeon: Okey Vina GAILS, MD;  Location: [facility] ENDOSCOPY;  Service: Cardiovascular;  Laterality: N/A;   TRANSCATHETER AORTIC VALVE REPLACEMENT, TRANSFEMORAL N/A 08/19/2022   Procedure: Transcatheter Aortic Valve Replacement, Transfemoral using a 26 MM Edwards SAPIEN 3 Ultra;  Surgeon: Wonda Sharper, MD;  Location: [facility] OR;  Service: Open Heart Surgery;  Laterality: N/A;  Transfemoral    Family History  Problem Relation Age of Onset   Diabetes Mother    Heart failure Father     Social History   Socioeconomic History   Marital status: Married    Spouse name: Henrita   Number of children: 3   Years of education: Not on file   Highest education level: High school graduate  Occupational History   Occupation: retired  Tobacco Use   Smoking status: Former    Current packs/day: 0.00    Types: Cigarettes    Quit date: 04/15/1983    Years since quitting: 41.2   Smokeless tobacco: Never   Tobacco comments:    Former smoker 10/16/22  Vaping Use   Vaping status: Never Used  Substance and Sexual Activity   Alcohol use: Yes    Comment: beer occasionally 10/16/22   Drug use: No   Sexual activity: Not on file  Other Topics Concern   Not  on file  Social History Narrative   Not on file   Social Drivers of Health   Financial Resource Strain: Low Risk  (08/13/2022)   Overall Financial Resource Strain (CARDIA)    Difficulty of Paying Living Expenses: Not hard at all  Food Insecurity: No Food Insecurity (07/03/2023)   Hunger Vital Sign    Worried About Running Out of Food in the Last Year: Never true    Ran Out of Food in the Last Year: Never true  Transportation Needs: No Transportation Needs (07/03/2023)   PRAPARE - Administrator, Civil Service (Medical): No    Lack of Transportation (Non-Medical): No  Physical Activity: Not on file  Stress: Not on file  Social Connections: Not on file  Intimate Partner Violence: Not At Risk (07/03/2023)   Humiliation, Afraid, Rape, and Kick questionnaire    Fear of Current or Ex-Partner: No  Emotionally Abused: No    Physically Abused: No    Sexually Abused: No    Review of Systems  Constitutional:  Negative for chills, fever and malaise/fatigue.  HENT:  Positive for congestion. Negative for sinus pain.   Respiratory:  Positive for cough. Negative for shortness of breath.   Cardiovascular:  Negative for chest pain and palpitations.  Musculoskeletal:  Negative for joint pain and myalgias.  Neurological:  Negative for headaches.        Objective    BP (!) 143/64   Pulse 65   Temp 97.9 F (36.6 C)   Ht 5' 10 (1.778 m)   Wt 134 lb 4 oz (60.9 kg)   SpO2 97%   BMI 19.26 kg/m   Physical Exam Constitutional:      General: He is not in acute distress.    Appearance: Normal appearance. He is normal weight. He is not ill-appearing.  HENT:     Head: Normocephalic and atraumatic.     Nose: Congestion present.     Mouth/Throat:     Mouth: Mucous membranes are moist.     Pharynx: Oropharynx is clear.  Eyes:     Extraocular Movements: Extraocular movements intact.     Conjunctiva/sclera: Conjunctivae normal.  Cardiovascular:     Rate and Rhythm: Normal  rate and regular rhythm.     Heart sounds: Normal heart sounds. No murmur heard. Pulmonary:     Effort: Pulmonary effort is normal.     Breath sounds: Normal breath sounds. No wheezing, rhonchi or rales.  Skin:    General: Skin is warm and dry.  Neurological:     General: No focal deficit present.     Mental Status: He is alert and oriented to person, place, and time.  Psychiatric:        Mood and Affect: Mood normal.        Behavior: Behavior normal.       Assessment & Plan:  Encounter to establish care  Viral URI Assessment & Plan: Patient appears stable today. Benign exam, lungs clear to auscultation bilaterally, no indication for chest XR at this time. Likely self-resolving viral infection. Supportive care reviewed with patient. Discussed with patient that there are no indications for antibiotics at this time, and viral respiratory illness can be persistent in duration. Promethazine DM for cough as needed. Tylenol  for pain or fever as needed. May continue with OTC cold medications, Mucinex samples given today. Patient instructed to return to clinic if worsening shortness of breath, chest pain, hypoxia, or other concerns. Patient agreeable to plan.   Orders: -     Promethazine-DM; Take 5 mLs by mouth 4 (four) times daily as needed for cough.  Dispense: 118 mL; Refill: 0    Return in about 3 months (around 10/20/2024) for wellness/medicare AWV.   Charmaine Kumari Sculley, PA-C

## 2024-07-21 ENCOUNTER — Ambulatory Visit: Admitting: Physician Assistant

## 2024-10-20 ENCOUNTER — Ambulatory Visit: Payer: Self-pay

## 2024-10-20 NOTE — Telephone Encounter (Signed)
 Unable to finish triage due to phone connectivity issues. Called pt x 2, left voicemail.  Patient was last seen in primary care on 07/20/2024 by [place], [place], [place].  Called Nurse Triage reporting No chief complaint on file..  Symptoms began today.  Triage Disposition: No disposition on file.  Patient/caregiver understands and will follow disposition?:    Message from Hadassah PARAS sent at 10/20/2024  3:39 PM EST  Reason for Triage: Pt fell and scrapped his knee and elbow due to the weather.  Needing to reschedule due to weather, pt has fell and is afraid for that to occur again   Answer Assessment - Initial Assessment Questions 1. MECHANISM: How did the fall happen?     Scraped ice off of the car, fell while doing so  2. DOMESTIC VIOLENCE AND ELDER ABUSE SCREENING: Did you fall because someone pushed you or tried to hurt you? If Yes, ask: Are you safe now?     Denies violence/abuse  3. ONSET: When did the fall happen? (e.g., minutes, hours, or days ago)     Today  4. LOCATION: What part of the body hit the ground? (e.g., back, buttocks, head, hips, knees, hands, head, stomach)      Knee and elbow scraped  5. INJURY: Did you hurt (injure) yourself when you fell? If Yes, ask: What did you injure? Tell me more about this? (e.g., body area; type of injury; pain severity)      Denies visible injuries  6. PAIN: Is there any pain? If Yes, ask: How bad is the pain? (e.g., Scale 0-10; or none, mild,       Denies   9. OTHER SYMPTOMS: Do you have any other symptoms? (e.g., dizziness, fever, weakness; new-onset or worsening).         10. CAUSE: What do you think caused the fall (or falling)? (e.g., dizzy spell, tripped)       Slipped  Pt reports Fall Pt is taking OTC Pt scheduled for a visit on   for further evaluation. Pt agrees with plan of care, will call back for any worsening symptoms  Protocols used: Falls and [facility]

## 2024-10-20 NOTE — Telephone Encounter (Signed)
 Patient/caller partially completed triage.  Reason for refusal: doesn't feel needed, wants to reschedule OV.    Son Tod calling back after call was dropped during triage. Patient was scheduled an office visit follow up on 10/21/24, son called today to reschedule appointment to March in the afternoon. Patient went out approximately one hour ago and slipped on ice and fell, he did not hit his head. Tod is not with patient at this time. Spouse asked Tod to call and reschedule appointment to March as Trapper does not want to take a chance going out of the house during winter weather, she reported he scrapped his knee and elbow and is otherwise fine. This is all the information that Tod has, he is en route to see [place], he will call back if Quinnten has an injuries more than minor scrape or if he had any symptoms prior to slip and fall. He declines further triage and  states if he has injuries they will proceed to urgent care today. Goal for call is to reschedule office visit. OV rescheduled to selected date in March.  Reason for Disposition  [1] Angry or rude caller AND [2] doesn't respond to 5 minutes of triager counseling AND [3] sick adult (or caller)  Protocols used: Difficult Call-A-AH

## 2024-10-21 ENCOUNTER — Ambulatory Visit: Admitting: Physician Assistant

## 2024-11-09 ENCOUNTER — Ambulatory Visit (HOSPITAL_COMMUNITY): Admitting: Physician Assistant

## 2024-11-21 ENCOUNTER — Ambulatory Visit: Admitting: Physician Assistant
